# Patient Record
Sex: Female | Born: 1945 | Race: White | Hispanic: No | Marital: Married | Smoking: Never smoker
Health system: Southern US, Community
[De-identification: ages and names within clinical notes are randomized; demographics above are authoritative.]

## PROBLEM LIST (undated history)

## (undated) DIAGNOSIS — G459 Transient cerebral ischemic attack, unspecified: Secondary | ICD-10-CM

## (undated) DIAGNOSIS — M810 Age-related osteoporosis without current pathological fracture: Secondary | ICD-10-CM

## (undated) DIAGNOSIS — I471 Supraventricular tachycardia, unspecified: Secondary | ICD-10-CM

## (undated) DIAGNOSIS — A6920 Lyme disease, unspecified: Secondary | ICD-10-CM

## (undated) DIAGNOSIS — E785 Hyperlipidemia, unspecified: Secondary | ICD-10-CM

## (undated) DIAGNOSIS — E039 Hypothyroidism, unspecified: Secondary | ICD-10-CM

## (undated) DIAGNOSIS — E7800 Pure hypercholesterolemia, unspecified: Secondary | ICD-10-CM

## (undated) DIAGNOSIS — I10 Essential (primary) hypertension: Secondary | ICD-10-CM

## (undated) DIAGNOSIS — K219 Gastro-esophageal reflux disease without esophagitis: Secondary | ICD-10-CM

## (undated) HISTORY — DX: Hyperlipidemia, unspecified: E78.5

## (undated) HISTORY — PX: ABDOMINAL HYSTERECTOMY: SHX81

## (undated) HISTORY — DX: Gastro-esophageal reflux disease without esophagitis: K21.9

## (undated) HISTORY — DX: Age-related osteoporosis without current pathological fracture: M81.0

## (undated) HISTORY — PX: BUNIONECTOMY: SHX129

## (undated) HISTORY — DX: Hypothyroidism, unspecified: E03.9

## (undated) HISTORY — PX: APPENDECTOMY: SHX54

## (undated) HISTORY — DX: Transient cerebral ischemic attack, unspecified: G45.9

---

## 2014-02-10 LAB — HM COLONOSCOPY

## 2016-06-03 DIAGNOSIS — R7301 Impaired fasting glucose: Secondary | ICD-10-CM | POA: Insufficient documentation

## 2016-07-07 DIAGNOSIS — Z8679 Personal history of other diseases of the circulatory system: Secondary | ICD-10-CM | POA: Insufficient documentation

## 2016-07-07 DIAGNOSIS — I471 Supraventricular tachycardia: Secondary | ICD-10-CM | POA: Insufficient documentation

## 2017-10-15 DIAGNOSIS — E785 Hyperlipidemia, unspecified: Secondary | ICD-10-CM

## 2017-11-16 DIAGNOSIS — K589 Irritable bowel syndrome without diarrhea: Secondary | ICD-10-CM | POA: Insufficient documentation

## 2017-11-16 DIAGNOSIS — K219 Gastro-esophageal reflux disease without esophagitis: Secondary | ICD-10-CM | POA: Insufficient documentation

## 2017-12-05 ENCOUNTER — Emergency Department (HOSPITAL_BASED_OUTPATIENT_CLINIC_OR_DEPARTMENT_OTHER): Payer: Medicare Other

## 2017-12-05 ENCOUNTER — Inpatient Hospital Stay (HOSPITAL_BASED_OUTPATIENT_CLINIC_OR_DEPARTMENT_OTHER)
Admission: EM | Admit: 2017-12-05 | Discharge: 2017-12-07 | DRG: 069 | Disposition: A | Discharge status: Home / Self Care | Payer: Medicare Other | Attending: Internal Medicine | Admitting: Internal Medicine

## 2017-12-05 ENCOUNTER — Encounter (HOSPITAL_BASED_OUTPATIENT_CLINIC_OR_DEPARTMENT_OTHER): Payer: Self-pay | Admitting: Emergency Medicine

## 2017-12-05 ENCOUNTER — Other Ambulatory Visit: Payer: Self-pay

## 2017-12-05 DIAGNOSIS — Z888 Allergy status to other drugs, medicaments and biological substances status: Secondary | ICD-10-CM | POA: Diagnosis not present

## 2017-12-05 DIAGNOSIS — K219 Gastro-esophageal reflux disease without esophagitis: Secondary | ICD-10-CM | POA: Diagnosis not present

## 2017-12-05 DIAGNOSIS — E7800 Pure hypercholesterolemia, unspecified: Secondary | ICD-10-CM | POA: Diagnosis not present

## 2017-12-05 DIAGNOSIS — Z79899 Other long term (current) drug therapy: Secondary | ICD-10-CM | POA: Diagnosis not present

## 2017-12-05 DIAGNOSIS — I729 Aneurysm of unspecified site: Secondary | ICD-10-CM

## 2017-12-05 DIAGNOSIS — Z880 Allergy status to penicillin: Secondary | ICD-10-CM | POA: Diagnosis not present

## 2017-12-05 DIAGNOSIS — I671 Cerebral aneurysm, nonruptured: Secondary | ICD-10-CM | POA: Diagnosis present

## 2017-12-05 DIAGNOSIS — Z7982 Long term (current) use of aspirin: Secondary | ICD-10-CM | POA: Diagnosis not present

## 2017-12-05 DIAGNOSIS — I959 Hypotension, unspecified: Secondary | ICD-10-CM | POA: Diagnosis not present

## 2017-12-05 DIAGNOSIS — R29700 NIHSS score 0: Secondary | ICD-10-CM | POA: Diagnosis not present

## 2017-12-05 DIAGNOSIS — E785 Hyperlipidemia, unspecified: Secondary | ICD-10-CM

## 2017-12-05 DIAGNOSIS — Z823 Family history of stroke: Secondary | ICD-10-CM

## 2017-12-05 DIAGNOSIS — E7439 Other disorders of intestinal carbohydrate absorption: Secondary | ICD-10-CM | POA: Diagnosis present

## 2017-12-05 DIAGNOSIS — Z90710 Acquired absence of both cervix and uterus: Secondary | ICD-10-CM | POA: Diagnosis not present

## 2017-12-05 DIAGNOSIS — G459 Transient cerebral ischemic attack, unspecified: Secondary | ICD-10-CM | POA: Diagnosis not present

## 2017-12-05 DIAGNOSIS — I10 Essential (primary) hypertension: Secondary | ICD-10-CM | POA: Diagnosis not present

## 2017-12-05 DIAGNOSIS — Z713 Dietary counseling and surveillance: Secondary | ICD-10-CM

## 2017-12-05 DIAGNOSIS — Z8673 Personal history of transient ischemic attack (TIA), and cerebral infarction without residual deficits: Secondary | ICD-10-CM | POA: Diagnosis present

## 2017-12-05 HISTORY — DX: Transient cerebral ischemic attack, unspecified: G45.9

## 2017-12-05 HISTORY — DX: Essential (primary) hypertension: I10

## 2017-12-05 HISTORY — DX: Hyperlipidemia, unspecified: E78.5

## 2017-12-05 LAB — DIFFERENTIAL
BASOS PCT: 0 %
Basophils Absolute: 0 10*3/uL (ref 0.0–0.1)
Eosinophils Absolute: 0.1 10*3/uL (ref 0.0–0.7)
Eosinophils Relative: 1 %
LYMPHS PCT: 25 %
Lymphs Abs: 2 10*3/uL (ref 0.7–4.0)
MONO ABS: 0.5 10*3/uL (ref 0.1–1.0)
MONOS PCT: 7 %
NEUTROS ABS: 5.4 10*3/uL (ref 1.7–7.7)
Neutrophils Relative %: 67 %

## 2017-12-05 LAB — COMPREHENSIVE METABOLIC PANEL
ALT: 18 U/L (ref 14–54)
AST: 21 U/L (ref 15–41)
Albumin: 4.2 g/dL (ref 3.5–5.0)
Alkaline Phosphatase: 71 U/L (ref 38–126)
Anion gap: 9 (ref 5–15)
BILIRUBIN TOTAL: 0.3 mg/dL (ref 0.3–1.2)
BUN: 21 mg/dL — ABNORMAL HIGH (ref 6–20)
CALCIUM: 9.1 mg/dL (ref 8.9–10.3)
CO2: 26 mmol/L (ref 22–32)
CREATININE: 0.84 mg/dL (ref 0.44–1.00)
Chloride: 103 mmol/L (ref 101–111)
Glucose, Bld: 89 mg/dL (ref 65–99)
Potassium: 4 mmol/L (ref 3.5–5.1)
Sodium: 138 mmol/L (ref 135–145)
TOTAL PROTEIN: 7.1 g/dL (ref 6.5–8.1)

## 2017-12-05 LAB — CBC
HEMATOCRIT: 38.3 % (ref 36.0–46.0)
Hemoglobin: 12.9 g/dL (ref 12.0–15.0)
MCH: 29.2 pg (ref 26.0–34.0)
MCHC: 33.7 g/dL (ref 30.0–36.0)
MCV: 86.7 fL (ref 78.0–100.0)
Platelets: 266 10*3/uL (ref 150–400)
RBC: 4.42 MIL/uL (ref 3.87–5.11)
RDW: 13.3 % (ref 11.5–15.5)
WBC: 8 10*3/uL (ref 4.0–10.5)

## 2017-12-05 LAB — TROPONIN I: Troponin I: <0.03 ng/mL (ref <0.03)

## 2017-12-05 LAB — CBG MONITORING, ED: GLUCOSE-CAPILLARY: 76 mg/dL (ref 65–99)

## 2017-12-05 LAB — APTT: aPTT: 26 seconds (ref 24–36)

## 2017-12-05 LAB — PROTIME-INR
INR: 0.87
Prothrombin Time: 11.7 seconds (ref 11.4–15.2)

## 2017-12-05 MED ORDER — METOPROLOL TARTRATE 25 MG PO TABS
25.0000 mg | ORAL_TABLET | Freq: Two times a day (BID) | ORAL | Status: DC
  Administered 2017-12-06 (×2): 25 mg via ORAL
  Filled 2017-12-05 (×3): qty 1

## 2017-12-05 MED ORDER — PANTOPRAZOLE SODIUM 40 MG PO TBEC
40.0000 mg | DELAYED_RELEASE_TABLET | Freq: Every day | ORAL | Status: DC
  Administered 2017-12-06 – 2017-12-07 (×2): 40 mg via ORAL
  Filled 2017-12-05 (×2): qty 1

## 2017-12-05 MED ORDER — ROSUVASTATIN CALCIUM 20 MG PO TABS
20.0000 mg | ORAL_TABLET | Freq: Every day | ORAL | Status: DC
  Administered 2017-12-06: 20 mg via ORAL
  Filled 2017-12-05: qty 1

## 2017-12-05 MED ORDER — ASPIRIN 325 MG PO TABS
325.0000 mg | ORAL_TABLET | Freq: Every day | ORAL | Status: DC
  Administered 2017-12-06 – 2017-12-07 (×2): 325 mg via ORAL
  Filled 2017-12-05 (×2): qty 1

## 2017-12-05 MED ORDER — ACETAMINOPHEN 325 MG PO TABS: 650.0000 mg | ORAL_TABLET | ORAL | Status: DC | PRN

## 2017-12-05 MED ORDER — ASPIRIN 300 MG RE SUPP: 300.0000 mg | Freq: Every day | RECTAL | Status: DC

## 2017-12-05 MED ORDER — AMLODIPINE BESYLATE 5 MG PO TABS
5.0000 mg | ORAL_TABLET | Freq: Every day | ORAL | Status: DC
  Filled 2017-12-05: qty 1

## 2017-12-05 MED ORDER — ACETAMINOPHEN 650 MG RE SUPP: 650.0000 mg | RECTAL | Status: DC | PRN

## 2017-12-05 MED ORDER — STROKE: EARLY STAGES OF RECOVERY BOOK
Freq: Once | Status: AC
  Administered 2017-12-06: 05:00:00

## 2017-12-05 MED ORDER — ACETAMINOPHEN 160 MG/5ML PO SOLN: 650.0000 mg | ORAL | Status: DC | PRN

## 2017-12-05 NOTE — ED Notes (Signed)
Patient transported to CT 

## 2017-12-05 NOTE — Consult Note (Signed)
TeleNeurology Consult Note  Allison Richardson     Consulting Physician: Stanley Garken Lue Code Status: No Order Primary Care Physician:  No primary care provider on file.  DOB:  07/05/1946   Age: 72 y.o.  Date of Service: December 05, 2017 Admit Date:  12/05/2017   Admitting Diagnosis: <principal problem not specified>  Subjective:  Reason for Consultation:stroke  Chief Complaint: right leg weakness/numbness  History of present illness: 72 y/o woman with h/o HTN and hyperlipidemia who presents with right leg weakness/nunbness starting yesterday at 1530. Emergent telestroke consult requested. CT head reviewed and case discussed with ED staff. NIHSS 0.  Review of Systems  There are no active problems to display for this patient.  Past Medical History:  Diagnosis Date  . High cholesterol   . Hypertension     Allergies  Allergen Reactions  . Penicillins     Social History   Socioeconomic History  . Marital status: Married    Spouse name: Not on file  . Number of children: Not on file  . Years of education: Not on file  . Highest education level: Not on file  Social Needs  . Financial resource strain: Not on file  . Food insecurity - worry: Not on file  . Food insecurity - inability: Not on file  . Transportation needs - medical: Not on file  . Transportation needs - non-medical: Not on file  Occupational History  . Not on file  Tobacco Use  . Smoking status: Not on file  Substance and Sexual Activity  . Alcohol use: Not on file  . Drug use: Not on file  . Sexual activity: Not on file  Other Topics Concern  . Not on file  Social History Narrative  . Not on file   Family History No family history on file.    Objective:  Vital signs in last 24 hours:   No data recorded.    Intake/Output last 3 shifts: No intake/output data recorded.  Intake/Output this shift: No intake/output data recorded.  Physical Exam 1a- LOC: Keenly responsive - 0      1b- LOC  questions: Answers both questions correctly - 0     1c- LOC commands- Performs both tasks correctly- 0     2- Gaze: Normal; no gaze paresis or gaze deviation - 0     3- Visual Fields: normal, no Visual field deficit - 0     4- Facial movements: no facial palsy - 0     5a- Right Upper limb motor - no drift -0     5b -Left Upper limb motor - no drift -0     6a- Right Lower limb motor - no drift - 0      6b- Left Lower limb motor - no drift - 0 7- Limb Coordination: absent ataxia - 0      8- Sensory : no sensory loss - 0      9- Language - No aphasia - 0      10- Speech - No dysarthria -0     11- Neglect / Extinction - none found -0     NIHSS score   0   Diagnostic Findings:  Pertinent Labs: No results for input(s): WBC, MCV in the last 168 hours.  Invalid input(s): HEMATOCRIT, HEMOGLOBIN, PLATELETS No results for input(s): POTASSIUM, CHLORIDE, CALCIUM, BUN, GLUCOSE, CREATININE, CO2 in the last 168 hours.  Invalid input(s): SODIUM, ANION GAP2, GFR MDRD AF AMER No results for input(s): AST, ALT,   ALBUMIN in the last 168 hours.  Invalid input(s): ALK PHOS, BILIRUBIN TOTAL, BILIRUBIN DIRECT, PROTEIN TOTAL, GLOBULIN No results for input(s): TRIG in the last 168 hours.  Invalid input(s): CHLPL, HDL PML, VLDL CALC, LDL CALC, CHOL/HDL RATIO, LDL/HDL RATIO, NON-HDL CHOLESTEROL Invalid input(s): CK TOTAL, TROPONIN I, CK MB No results for input(s): APTT in the last 168 hours. No results for input(s): INR in the last 168 hours.  Invalid input(s): PROTHROMBIN TIME  Extensive review of previous medical records completed.    Assessment: There are no active problems to display for this patient.    Plan: [facility] TeleNeurology Consult Services  Impression: TIA     Differential Diagnosis:  1. Cardioembolic stroke  2. Small vessel disease/lacune    3. Thromboembolic, artery-to-artery mechanism 4. Hypercoagulable state-related infarct  5. Thrombotic mechanism, large artery  disease 6. Transient ischemic attack  Comments: Last known well:   1530 yesterday Door [id number] [facility] contacted:   [id number] [facility] at bedside:   1712 NIHSS assessment time:1712 CT reviewed at 1715 Needle time:TPA not considered since she is out of the TPA window Thrombectomy not considered since large vessel occlusion is not suspected, NIHSS 0 and symptoms > 24 hours     Discussion:     Our recommendations are outlined below.  Recommendations: ASA, IV fluids and permissive hypertension (Keep SBP < 220/110) Neurochecks DVT prophylaxis    Follow up with Neurology for further testing and evaluation  Medical Decision Making:  - Extensive number of diagnosis or management options are considered above. - Extensive amount of complex data reviewed.  - High risk of complication and/or morbidity or mortality are associated with differential diagnostic considerations above. - There may be uncertain outcome and increased probability of prolonged functional impairment or high probability of severe prolonged functional impairment associated with some of these differential diagnosis.  Medical Data Reviewed: 1.Data reviewed include clinical labs, radiology, Medical Tests; 2.Tests results discussed w/performing or interpreting physician;    3.Obtaining/reviewing old medical records; 4.Obtaining case history from another source;    5.Independent review of image, tracing or specimen.  Signed: 12/05/2017  5:16 PM

## 2017-12-05 NOTE — H&P (Addendum)
TRH H&P   Patient Demographics:    Allison Richardson, is a 72 y.o. female  MRN: [id number]   DOB - 06/24/1946  Admit Date - 12/05/2017  Outpatient Primary MD for the patient is No primary care provider on file.  Referring MD/NP/PA:   Demetrios Loll  Outpatient Specialists:     Patient coming from:  [place]   Chief Complaint  Patient presents with  . Numbness      HPI:    Allison Richardson  is a 72 y.o. female, w hypertension, hyperlipidemia, gerd, apparently presents with right leg numbness, and heaviness this AM ,lasting for about 1 hour, she apparently lost control of her legs several times since yesterday at 3pm.  Some right sided headache today 30 minutes prior to arrival in ED.  Tele neurology consulted and Dr. Jacqlyn Krauss recommended TIA w/up and permissive hypertension.  Pt took 324mg  of aspirin today.   In ED,  CT brain negative  INR 0.87 PTT 26 Wbc 8.0, Hgb 12.9, Plt 266 Na 138, K 4.0, Bun 21, Creatinine 0.84 Ast 21, Alt 18  Pt will be admitted for w/up of TIA r/o CVA     Review of systems:    In addition to the HPI above,  No Fever-chills, No changes with Vision or hearing, No problems swallowing food or Liquids, No Chest pain, Cough or Shortness of Breath, No Abdominal pain, No Nausea or Vommitting, Bowel movements are regular, No Blood in stool or Urine, No dysuria, No new skin rashes or bruises, No new joints pains-aches,  No new weakness, tingling, numbness in any extremity, No recent weight gain or loss, No polyuria, polydypsia or polyphagia, No significant Mental Stressors.  A full 10 point Review of Systems was done, except as stated above, all other Review of Systems were negative.   With Past History of the following :    Past Medical History:  Diagnosis Date  . High cholesterol   . Hypertension       Past Surgical History:  Procedure  Laterality Date  . ABDOMINAL HYSTERECTOMY    . BUNIONECTOMY Left       Social History:     Social History   Tobacco Use  . Smoking status: Never Smoker  . Smokeless tobacco: Never Used  Substance Use Topics  . Alcohol use: No    Frequency: Never     Lives - at [employer] - walks by self   Family History :     Family History  Problem Relation Age of Onset  . Stroke Mother   . Stroke Father       Home Medications:   Prior to Admission medications   Medication Sig Start Date End Date Taking? Authorizing Provider  amLODipine (NORVASC) 5 MG tablet Take 5 mg by mouth daily.   Yes [provider]  aspirin 81 MG chewable tablet Chew  81 mg by mouth daily.   Yes [provider]  Atorvastatin Calcium (LIPITOR PO) Take by mouth daily.   Yes [provider]  losartan-hydrochlorothiazide (HYZAAR) 100-12.5 MG tablet Take 1 tablet by mouth daily.   Yes [provider]  metoprolol tartrate (LOPRESSOR) 25 MG tablet Take 25 mg by mouth 2 (two) times daily.   Yes [provider]  omeprazole (PRILOSEC) 20 MG capsule Take 20 mg by mouth daily.   Yes [provider]     Allergies:     Allergies  Allergen Reactions  . Other     Has had adverse reaction to a migraine medication  . Penicillins Itching     Physical Exam:   Vitals  Blood pressure 127/60, pulse (!) 57, temperature 97.6 F (36.4 C), temperature source Oral, resp. rate 17, height 5\' 7"  (1.702 m), weight 81.9 kg (180 lb 8.9 oz), SpO2 99 %.   1. General  lying in bed in NAD,   2. Normal affect and insight, Not Suicidal or Homicidal, Awake Alert, Oriented X 3.  3. No F.N deficits, ALL C.Nerves Intact, Strength 5/5 all 4 extremities, Sensation intact all 4 extremities, Plantars down going.  4. Ears and Eyes appear Normal, Conjunctivae clear, PERRLA. Moist Oral Mucosa.  5. Supple Neck, No JVD, No cervical lymphadenopathy appriciated, No Carotid  Bruits.  6. Symmetrical Chest wall movement, Good air movement bilaterally, CTAB.  7. RRR, No Gallops, Rubs or Murmurs, No Parasternal Heave.  8. Positive Bowel Sounds, Abdomen Soft, No tenderness, No organomegaly appriciated,No rebound -guarding or rigidity.  9.  No Cyanosis, Normal Skin Turgor, No Skin Rash or Bruise.  10. Good muscle tone,  joints appear normal , no effusions, Normal ROM.  11. No Palpable Lymph Nodes in Neck or Axillae     Data Review:    CBC Recent Labs  Lab 12/05/17 1703  WBC 8.0  HGB 12.9  HCT 38.3  PLT 266  MCV 86.7  MCH 29.2  MCHC 33.7  RDW 13.3  LYMPHSABS 2.0  MONOABS 0.5  EOSABS 0.1  BASOSABS 0.0   ------------------------------------------------------------------------------------------------------------------  Chemistries  Recent Labs  Lab 12/05/17 1703  NA 138  K 4.0  CL 103  CO2 26  GLUCOSE 89  BUN 21*  CREATININE 0.84  CALCIUM 9.1  AST 21  ALT 18  ALKPHOS 71  BILITOT 0.3   ------------------------------------------------------------------------------------------------------------------ estimated creatinine clearance is 67.6 mL/min (by C-G formula based on SCr of 0.84 mg/dL). ------------------------------------------------------------------------------------------------------------------ No results for input(s): TSH, T4TOTAL, T3FREE, THYROIDAB in the last 72 hours.  Invalid input(s): FREET3  Coagulation profile Recent Labs  Lab 12/05/17 1703  INR 0.87   ------------------------------------------------------------------------------------------------------------------- No results for input(s): DDIMER in the last 72 hours. -------------------------------------------------------------------------------------------------------------------  Cardiac Enzymes Recent Labs  Lab 12/05/17 1703  TROPONINI <0.03    ------------------------------------------------------------------------------------------------------------------ No results found for: BNP   ---------------------------------------------------------------------------------------------------------------  Urinalysis No results found for: COLORURINE, APPEARANCEUR, LABSPEC, PHURINE, GLUCOSEU, HGBUR, BILIRUBINUR, KETONESUR, PROTEINUR, UROBILINOGEN, NITRITE, LEUKOCYTESUR  ----------------------------------------------------------------------------------------------------------------   Imaging Results:    Ct Head Wo Contrast  Result Date: 12/05/2017 CLINICAL DATA:  Pt states she has numbness in her right leg, legs gave out yesterday, headache, pt states she just felt "weird", no blurred vision, no hx stroke, HTN on many meds, mild headache right frontal area EXAM: CT HEAD WITHOUT CONTRAST TECHNIQUE: Contiguous axial images were obtained from the base of the skull through the vertex without intravenous contrast. COMPARISON:  None. FINDINGS: Brain: No evidence of acute infarction, hemorrhage,  hydrocephalus, extra-axial collection or mass lesion/mass effect. Vascular: No hyperdense vessel or unexpected calcification. Skull: Normal. Negative for fracture or focal lesion. Sinuses/Orbits: Globes and orbits are unremarkable. Visualized sinuses and mastoid air cells are clear. Other: None. IMPRESSION: Normal unenhanced CT scan of the brain. Electronically Signed   By: Amie Portland M.D.   On: 12/05/2017 17:16   nsr at 60 nl axis, early R no st-t changes c/w ischemia     Assessment & Plan:    Principal Problem:   TIA (transient ischemic attack) Active Problems:   Essential hypertension   Hyperlipidemia    TIA Tele MRI/ MRA brain Carotid ultrasound Cardiac echo Check hga1c, lipid,  PT/OT, speech consult Aspirin 325mg  po qday Lipitor=> Crestor Please contact neurology for consult in am.   Hypertension  Cont metoprolol Cont  amlodipine Hold Hyzaar for permissive hypertension  Hyperlipidemia Cont Lipitor  Gerd Cont PPI  DVT Prophylaxis Lovenox - SCDs  AM Labs Ordered, also please review Full Orders  Family Communication: Admission, patients condition and plan of care including tests being ordered have been discussed with the patient who indicate understanding and agree with the plan and Code Status.  Code Status FULL CODE  Likely DC to  Home to [employer] (pt would like to be discharged tomorrow if w/up negative)  Condition GUARDED   Consults called: none, please call neurology in AM  Admission status: observation  Time spent in minutes : 45   Pearson Grippe M.D on 12/05/2017 at 9:40 PM  Between 7am to 7pm - Pager - [phone]  . After 7pm go to [email or web address] - password [facility]  [facility] - Office  [phone]

## 2017-12-05 NOTE — ED Notes (Addendum)
Pt returned from CT. Tele-neuro in progress. Pt reports right side headache and right leg numbness (knee down) since 1630. Sx started while sitting [place] in rec [place] [facility] [employer] doing a puzzle. States she fell yesterday in her closet around 3pm "both legs weak" and "lost all control of my legs". States she felt "slightly disoriented" at that time. States her symptoms improved although she still felt "a little weak" while walking for the rest of day. States this am she was mostly back to normal. Reports her right foot "was a little numb" this afternoon prior to leaving to go do a puzzle in the rec room. Sx are resolving at this time

## 2017-12-05 NOTE — ED Provider Notes (Signed)
[facility] EMERGENCY DEPARTMENT Provider Note   CSN: [id number] Arrival date & time: 12/05/17  1652     History   Chief Complaint Chief Complaint  Patient presents with  . Numbness    HPI Allison Richardson is a 72 y.o. female.  HPI 72 year old Caucasian female past medical history significant for high cholesterol, hypertension presents to the emergency department today with complaint of headache and right leg numbness.  Upon further questioning patient states that since yesterday approximately 3 PM she has had "weakness in both legs".  She states that she has almost lost control of her legs several times since yesterday.  She also reports being slightly "disoriented".  Patient states approximate 30 minutes prior to arrival today she got right-sided headache and complained of right leg numbness from the knee down.  She states that it has improved since that time.  Patient denies any other associated symptoms including aphasia, ataxia, facial droop, weakness.  Again patient states that her symptoms are resolving.  She denies any history of CVA that does report family history of CVA.    Pt denies any fever, chill, vision changes, lightheadedness, dizziness, congestion, neck pain, cp, sob, cough, abd pain, n/v/d, urinary symptoms, change in bowel habits, melena, hematochezia.  Past Medical History:  Diagnosis Date  . High cholesterol   . Hypertension     There are no active problems to display for this patient.   Past Surgical History:  Procedure Laterality Date  . ABDOMINAL HYSTERECTOMY    . BUNIONECTOMY Left     OB History    No data available       Home Medications    Prior to Admission medications   Medication Sig Start Date End Date Taking? Authorizing Provider  amLODipine (NORVASC) 5 MG tablet Take 5 mg by mouth daily.   Yes [provider]  aspirin 81 MG chewable tablet Chew 81 mg by mouth daily.   Yes [provider]  Atorvastatin Calcium  (LIPITOR PO) Take by mouth daily.   Yes [provider]  losartan-hydrochlorothiazide (HYZAAR) 100-12.5 MG tablet Take 1 tablet by mouth daily.   Yes [provider]  metoprolol tartrate (LOPRESSOR) 25 MG tablet Take 25 mg by mouth 2 (two) times daily.   Yes [provider]  omeprazole (PRILOSEC) 20 MG capsule Take 20 mg by mouth daily.   Yes [provider]    Family History No family history on file.  Social History Social History   Tobacco Use  . Smoking status: Never Smoker  . Smokeless tobacco: Never Used  Substance Use Topics  . Alcohol use: No    Frequency: Never  . Drug use: No     Allergies   Other and Penicillins   Review of Systems Review of Systems  Constitutional: Negative for chills and fever.  HENT: Negative for congestion.   Eyes: Negative for visual disturbance.  Respiratory: Negative for cough and shortness of breath.   Cardiovascular: Negative for chest pain.  Gastrointestinal: Negative for abdominal pain, diarrhea, nausea and vomiting.  Genitourinary: Negative for dysuria, flank pain, frequency, hematuria, urgency, vaginal bleeding and vaginal discharge.  Musculoskeletal: Negative for arthralgias and myalgias.  Skin: Negative for rash.  Neurological: Positive for weakness, numbness and headaches. Negative for dizziness, syncope and light-headedness.  Psychiatric/Behavioral: Negative for sleep disturbance. The patient is not nervous/anxious.      Physical Exam Updated Vital Signs Temp 98.1 F (36.7 C)   Physical Exam  Constitutional: She is oriented  to person, place, and time. She appears well-developed and well-nourished.  Non-toxic appearance. No distress.  HENT:  Head: Normocephalic and atraumatic.  Nose: Nose normal.  Mouth/Throat: Oropharynx is clear and moist.  Eyes: Conjunctivae are normal. Pupils are equal, round, and reactive to light. Right eye exhibits no discharge. Left eye exhibits no discharge.    Neck: Normal range of motion. Neck supple.  Cardiovascular: Normal rate, regular rhythm, normal heart sounds and intact distal pulses. Exam reveals no gallop and no friction rub.  No murmur heard. Pulmonary/Chest: Effort normal and breath sounds normal. No stridor. No respiratory distress. She has no wheezes. She has no rales. She exhibits no tenderness.  Abdominal: Soft. Bowel sounds are normal. There is no tenderness. There is no rebound and no guarding.  Musculoskeletal: Normal range of motion. She exhibits no tenderness.  Lymphadenopathy:    She has no cervical adenopathy.  Neurological: She is alert and oriented to person, place, and time.  The patient is alert, attentive, and oriented x 3. Speech is clear. Cranial nerve II-VII grossly intact. Negative pronator drift. Sensation intact. Strength 5/5 in all extremities. Reflexes 2+ and symmetric at biceps, triceps, knees, and ankles. Rapid alternating movement and fine finger movements intact.  Romberg and gait deferred   Skin: Skin is warm and dry. Capillary refill takes less than 2 seconds.  Psychiatric: Her behavior is normal. Judgment and thought content normal.  Nursing note and vitals reviewed.    ED Treatments / Results  Labs (all labs ordered are listed, but only abnormal results are displayed) Labs Reviewed  COMPREHENSIVE METABOLIC PANEL - Abnormal; Notable for the following components:      Result Value   BUN 21 (*)    All other components within normal limits  PROTIME-INR  APTT  CBC  DIFFERENTIAL  TROPONIN I  CBG MONITORING, ED    EKG  EKG Interpretation  Date/Time:  Saturday December 05 2017 17:12:07 EST Ventricular Rate:  59 PR Interval:    QRS Duration: 89 QT Interval:  407 QTC Calculation: 404 R Axis:   -21 Text Interpretation:  Sinus arrhythmia Borderline left axis deviation Baseline wander in lead(s) V2 No prior ECg for comparison.  No STEMI Confirmed by Theda Belfast ([id number]) on 12/05/2017 5:14:27  PM       Radiology Ct Head Wo Contrast  Result Date: 12/05/2017 CLINICAL DATA:  Pt states she has numbness in her right leg, legs gave out yesterday, headache, pt states she just felt "weird", no blurred vision, no hx stroke, HTN on many meds, mild headache right frontal area EXAM: CT HEAD WITHOUT CONTRAST TECHNIQUE: Contiguous axial images were obtained from the base of the skull through the vertex without intravenous contrast. COMPARISON:  None. FINDINGS: Brain: No evidence of acute infarction, hemorrhage, hydrocephalus, extra-axial collection or mass lesion/mass effect. Vascular: No hyperdense vessel or unexpected calcification. Skull: Normal. Negative for fracture or focal lesion. Sinuses/Orbits: Globes and orbits are unremarkable. Visualized sinuses and mastoid air cells are clear. Other: None. IMPRESSION: Normal unenhanced CT scan of the brain. Electronically Signed   By: Amie Portland M.D.   On: 12/05/2017 17:16    Procedures Procedures (including critical care time)  Medications Ordered in ED Medications - No data to display   Initial Impression / Assessment and Plan / ED Course  I have reviewed the triage vital signs and the nursing notes.  Pertinent labs & imaging results that were available during my care of the patient were reviewed by me and considered  in my medical decision making (see chart for details).     Patient presents to the ED with complaints of headache and right leg numbness.  Patient although states that for the past day she has been having bilateral leg weakness, disorientation, numbness.  States that 30 minutes prior to arrival today she had a right-sided headache with numbness to her right leg below the right knee that has progressively improved since coming to the ED.  On initial examination patient has no focal neuro deficit.  Vital signs are reassuring.  Stroke was not initiated.  Patient was sent to CT.  Patient evaluated by Dr. Demetrio LappingLue with teleneurology.   Feel that patient symptoms likely TIA will need admission for further workup.  Direct recommends aspirin, IV fluids.   EKG shows no signs of acute ischemia.  CT of head was unremarkable.  CBG was normal.  Lab work pending at this time.  Lab work reassuring.  Leukocytosis.  Normal kidney function.  Troponin negative.  Patient has already received 324 mg ASA prior to arrival.  Spoke with Dr. Clyde LundborgNiu with hospital medicine who agrees to admission.  Patient updated on plan of care.  [facility].  She remains hemodynamically stable this time. Final Clinical Impressions(s) / ED Diagnoses   Final diagnoses:  TIA (transient ischemic attack)    ED Discharge Orders    None       Wallace KellerLeaphart, Yaneisy Wenz T, PA-C 12/05/17 1916    Tegeler, Canary Brimhristopher J, MD 12/07/17 [phone]

## 2017-12-05 NOTE — ED Triage Notes (Signed)
Numbness to R leg x 30 min with R side headache x 30 min.

## 2017-12-05 NOTE — ED Notes (Signed)
ED Provider at bedside at time pt brought from triage to evaluate

## 2017-12-05 NOTE — Care Management (Signed)
  This is a no charge note  [facility], Joselyn Glassmanyler  72 year old lady with past medical history of hypertension, hyperlipidemia, GERD, who presents with right leg weakness, numbness and right-sided headache.  Pt states that she lost control of her legs several times since yesterday at about 3:00 PM. Patient states approximate 30 minutes prior to arrival today she got right-sided headache and complained of right leg numbness from the knee down. She states that it has improved since that time. CT head is negative. Tele neurology was consulted. Dr. Jacqlyn KraussGarken recommended TIA workup and permissive hypertension (<220/110). Patient already took 324 mg of aspirin.   Patient was found to have WBC 8.0, INR 0.87, PVD 26, negative troponin, electrolytes renal function okay, temperature normal, bradycardia, oxygen saturation 96% on room air. Patient is placed on telemetry bed for observation. May need to consult our neurologist at the patient arrival.    Please call manager of [facility] at [phone] when pt arrives to floor   Lorretta HarpXilin Damarian Priola, MD  [facility] Pager [phone]  If 7PM-7AM, please contact night-coverage [email or web address] Password [email or web address] 12/05/2017, 7:15 PM

## 2017-12-05 NOTE — ED Notes (Signed)
ED Provider at bedside Cruzita Lederer([place], PA). Pt is taking her home meds with verbal approval from provider. Pt reports she took her 81 mg ASA today and then a double strength ASA this afternoon

## 2017-12-05 NOTE — ED Notes (Signed)
Pt [facility] Bary CastillaKalil [facility]

## 2017-12-05 NOTE — ED Notes (Signed)
Pt on cardiac monitor and auto VS 

## 2017-12-06 ENCOUNTER — Observation Stay (HOSPITAL_COMMUNITY): Payer: Medicare Other

## 2017-12-06 DIAGNOSIS — Z7982 Long term (current) use of aspirin: Secondary | ICD-10-CM | POA: Diagnosis not present

## 2017-12-06 DIAGNOSIS — Z79899 Other long term (current) drug therapy: Secondary | ICD-10-CM | POA: Diagnosis not present

## 2017-12-06 DIAGNOSIS — Z90710 Acquired absence of both cervix and uterus: Secondary | ICD-10-CM | POA: Diagnosis not present

## 2017-12-06 DIAGNOSIS — Z823 Family history of stroke: Secondary | ICD-10-CM | POA: Diagnosis not present

## 2017-12-06 DIAGNOSIS — E7800 Pure hypercholesterolemia, unspecified: Secondary | ICD-10-CM | POA: Diagnosis present

## 2017-12-06 DIAGNOSIS — I959 Hypotension, unspecified: Secondary | ICD-10-CM | POA: Diagnosis not present

## 2017-12-06 DIAGNOSIS — E7439 Other disorders of intestinal carbohydrate absorption: Secondary | ICD-10-CM | POA: Diagnosis present

## 2017-12-06 DIAGNOSIS — R29700 NIHSS score 0: Secondary | ICD-10-CM | POA: Diagnosis present

## 2017-12-06 DIAGNOSIS — I361 Nonrheumatic tricuspid (valve) insufficiency: Secondary | ICD-10-CM | POA: Diagnosis not present

## 2017-12-06 DIAGNOSIS — I671 Cerebral aneurysm, nonruptured: Secondary | ICD-10-CM | POA: Diagnosis present

## 2017-12-06 DIAGNOSIS — Z888 Allergy status to other drugs, medicaments and biological substances status: Secondary | ICD-10-CM | POA: Diagnosis not present

## 2017-12-06 DIAGNOSIS — G459 Transient cerebral ischemic attack, unspecified: Secondary | ICD-10-CM | POA: Diagnosis present

## 2017-12-06 DIAGNOSIS — I10 Essential (primary) hypertension: Secondary | ICD-10-CM | POA: Diagnosis present

## 2017-12-06 DIAGNOSIS — Z713 Dietary counseling and surveillance: Secondary | ICD-10-CM | POA: Diagnosis not present

## 2017-12-06 DIAGNOSIS — E785 Hyperlipidemia, unspecified: Secondary | ICD-10-CM | POA: Diagnosis present

## 2017-12-06 DIAGNOSIS — Z880 Allergy status to penicillin: Secondary | ICD-10-CM | POA: Diagnosis not present

## 2017-12-06 DIAGNOSIS — K219 Gastro-esophageal reflux disease without esophagitis: Secondary | ICD-10-CM | POA: Diagnosis present

## 2017-12-06 LAB — LIPID PANEL
CHOL/HDL RATIO: 3.1 ratio
Cholesterol: 154 mg/dL (ref 0–200)
HDL: 49 mg/dL (ref >40)
LDL Cholesterol: 86 mg/dL (ref 0–99)
Triglycerides: 93 mg/dL (ref <150)
VLDL: 19 mg/dL (ref 0–40)

## 2017-12-06 LAB — COMPREHENSIVE METABOLIC PANEL
ALT: 17 U/L (ref 14–54)
AST: 28 U/L (ref 15–41)
Albumin: 3.9 g/dL (ref 3.5–5.0)
Alkaline Phosphatase: 67 U/L (ref 38–126)
Anion gap: 11 (ref 5–15)
BUN: 13 mg/dL (ref 6–20)
CHLORIDE: 103 mmol/L (ref 101–111)
CO2: 25 mmol/L (ref 22–32)
CREATININE: 0.8 mg/dL (ref 0.44–1.00)
Calcium: 9 mg/dL (ref 8.9–10.3)
GFR calc Af Amer: >60 mL/min (ref >60)
GFR calc non Af Amer: >60 mL/min (ref >60)
Glucose, Bld: 95 mg/dL (ref 65–99)
POTASSIUM: 3.6 mmol/L (ref 3.5–5.1)
SODIUM: 139 mmol/L (ref 135–145)
Total Bilirubin: 0.8 mg/dL (ref 0.3–1.2)
Total Protein: 6.8 g/dL (ref 6.5–8.1)

## 2017-12-06 LAB — CBC
HEMATOCRIT: 40.2 % (ref 36.0–46.0)
Hemoglobin: 13.2 g/dL (ref 12.0–15.0)
MCH: 28.7 pg (ref 26.0–34.0)
MCHC: 32.8 g/dL (ref 30.0–36.0)
MCV: 87.4 fL (ref 78.0–100.0)
PLATELETS: 254 10*3/uL (ref 150–400)
RBC: 4.6 MIL/uL (ref 3.87–5.11)
RDW: 13.8 % (ref 11.5–15.5)
WBC: 6.2 10*3/uL (ref 4.0–10.5)

## 2017-12-06 MED ORDER — ASPIRIN 325 MG PO TABS: 325.0000 mg | ORAL_TABLET | Freq: Every day | ORAL | 0 refills | Status: DC

## 2017-12-06 NOTE — Progress Notes (Signed)
  Echocardiogram 2D Echocardiogram has been performed.  Eugenia Eldredge T Duey Liller 12/06/2017, 3:33 PM

## 2017-12-06 NOTE — Progress Notes (Signed)
Patient ID: Allison HongBillie Mundo, female   DOB: 02/13/1946, 72 y.o.   MRN: [id number]                                                                PROGRESS NOTE                                                                                                                                                                                                             Patient Demographics:    Allison Richardson, is a 72 y.o. female, DOB - 03/02/1946, WUJ:[id number]  Admit date - 12/05/2017   Admitting Physician Lorretta HarpXilin Niu, MD  Outpatient Primary MD for the patient is No primary care provider on file.  LOS - 0  Outpatient Specialists:  Chief Complaint  Patient presents with  . Numbness       Brief Narrative  Allison HongBillie Bleier  is a 72 y.o. female, w hypertension, hyperlipidemia, gerd, apparently presents with right leg numbness, and heaviness this AM ,lasting for about 1 hour, she apparently lost control of her legs several times since yesterday at 3pm.  Some right sided headache today 30 minutes prior to arrival in ED.  Tele neurology consulted and Dr. Jacqlyn KraussGarken recommended TIA w/up and permissive hypertension.  Pt took 324mg  of aspirin today.   In ED,  CT brain negative  INR 0.87 PTT 26 Wbc 8.0, Hgb 12.9, Plt 266 Na 138, K 4.0, Bun 21, Creatinine 0.84 Ast 21, Alt 18  Pt will be admitted for w/up of TIA r/o CVA      Subjective:    Allison HongBillie Mounce today has no numbness, weakness of her legs.   No headache, No chest pain, No abdominal pain - No Nausea, No new weakness tingling or numbness, No Cough - SOB.    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 Recommended outpatient f/u w  neurology  Hypertension  Cont metoprolol Cont amlodipine  Hyperlipidemia Cont Crestor  Gerd Cont PPI    DVT Prophylaxis Lovenox -  SCDs  AM Labs Ordered, also please review Full Orders  Family Communication: Admission, patients condition and plan of care including tests being ordered have been discussed with the patient who indicate understanding and agree with the plan and Code Status.  Code Status FULL CODE  Likely DC to  Home to [employer] (pt would like to be discharged tomorrow if w/up negative)  Condition GUARDED   Consults called: none, please call neurology in AM  Admission status: observation  Time spent in minutes : 45   .  Anti-infectives (From admission, onward)   None        Objective:   Vitals:   12/05/17 2000 12/05/17 2100 12/06/17 0805 12/06/17 1215  BP: 102/67 127/60 (!) 107/55 (!) 109/53  Pulse: (!) 53 (!) 57 64 (!) 58  Resp: 17 17 18 17   Temp:  97.6 F (36.4 C) 97.8 F (36.6 C) 97.6 F (36.4 C)  TempSrc:  Oral Oral Oral  SpO2: 98% 99% 99% 98%  Weight:  81.9 kg (180 lb 8.9 oz)    Height:  5\' 7"  (1.702 m)      Wt Readings from Last 3 Encounters:  12/05/17 81.9 kg (180 lb 8.9 oz)    No intake or output data in the 24 hours ending 12/06/17 1648   Physical Exam  Awake Alert, Oriented X 3, No new F.N deficits, Normal affect [place].AT,PERRAL Supple Neck,No JVD, No cervical lymphadenopathy appriciated.  Symmetrical Chest wall movement, Good air movement bilaterally, CTAB RRR,No Gallops,Rubs or new Murmurs, No Parasternal Heave +ve B.Sounds, Abd Soft, No tenderness, No organomegaly appriciated, No rebound - guarding or rigidity. No Cyanosis, Clubbing or edema, No new Rash or bruise      Data Review:    CBC Recent Labs  Lab 12/05/17 1703 12/06/17 0617  WBC 8.0 6.2  HGB 12.9 13.2  HCT 38.3 40.2  PLT 266 254  MCV 86.7 87.4  MCH 29.2 28.7  MCHC 33.7 32.8  RDW 13.3 13.8  LYMPHSABS 2.0  --   MONOABS 0.5  --    EOSABS 0.1  --   BASOSABS 0.0  --     Chemistries  Recent Labs  Lab 12/05/17 1703 12/06/17 0617  NA 138 139  K 4.0 3.6  CL 103 103  CO2 26 25  GLUCOSE 89 95  BUN 21* 13  CREATININE 0.84 0.80  CALCIUM 9.1 9.0  AST 21 28  ALT 18 17  ALKPHOS 71 67  BILITOT 0.3 0.8   ------------------------------------------------------------------------------------------------------------------ Recent Labs    12/06/17 0617  CHOL 154  HDL 49  LDLCALC 86  TRIG 93  CHOLHDL 3.1    No results found for: HGBA1C ------------------------------------------------------------------------------------------------------------------ No results for input(s): TSH, T4TOTAL, T3FREE, THYROIDAB in the last 72 hours.  Invalid input(s): FREET3 ------------------------------------------------------------------------------------------------------------------ No results for input(s): VITAMINB12, FOLATE, FERRITIN, TIBC, IRON, RETICCTPCT in the last 72 hours.  Coagulation profile Recent Labs  Lab 12/05/17 1703  INR 0.87    No results for input(s): DDIMER in the last 72 hours.  Cardiac Enzymes Recent Labs  Lab 12/05/17 1703  TROPONINI <0.03   ------------------------------------------------------------------------------------------------------------------ No results found for: BNP  Inpatient Medications  Scheduled Meds: . amLODipine  5 mg Oral Daily  . aspirin  300 mg Rectal Daily   Or  . aspirin  325 mg Oral Daily  . metoprolol tartrate  25 mg Oral BID  . pantoprazole  40 mg Oral Daily  . rosuvastatin  20 mg Oral q1800   Continuous Infusions:  PRN Meds:.acetaminophen **OR** acetaminophen (TYLENOL) oral liquid 160 mg/5 mL **OR** acetaminophen  Micro Results No results found for this or any previous visit (from the past 240 hour(s)).  Radiology Reports Ct Head Wo Contrast  Result Date: 12/05/2017 CLINICAL DATA:  Pt states she has numbness in her right leg, legs gave out yesterday,  headache, pt states she just felt "weird", no blurred vision, no hx stroke, HTN on many meds, mild headache right frontal area EXAM: CT HEAD WITHOUT CONTRAST TECHNIQUE: Contiguous axial images were obtained from the base of the skull through the vertex without intravenous contrast. COMPARISON:  None. FINDINGS: Brain: No evidence of acute infarction, hemorrhage, hydrocephalus, extra-axial collection or mass lesion/mass effect. Vascular: No hyperdense vessel or unexpected calcification. Skull: Normal. Negative for fracture or focal lesion. Sinuses/Orbits: Globes and orbits are unremarkable. Visualized sinuses and mastoid air cells are clear. Other: None. IMPRESSION: Normal unenhanced CT scan of the brain. Electronically Signed   By: Amie Portland M.D.   On: 12/05/2017 17:16   Mr Brain Wo Contrast  Result Date: 12/06/2017 CLINICAL DATA:  Disoriented. Lower extremity weakness and RIGHT headache. EXAM: MRI HEAD WITHOUT CONTRAST MRA HEAD WITHOUT CONTRAST TECHNIQUE: Multiplanar, multiecho pulse sequences of the brain and surrounding structures were obtained without intravenous contrast. Angiographic images of the head were obtained using MRA technique without contrast. COMPARISON:  CT HEAD December 05, 2017 FINDINGS: MRI HEAD FINDINGS INTRACRANIAL CONTENTS: No reduced diffusion to suggest acute ischemia. No susceptibility artifact to suggest hemorrhage. The ventricles and sulci are normal for patient's age. No suspicious parenchymal signal, masses, mass effect. No abnormal extra-axial fluid collections. No extra-axial masses. VASCULAR: Normal major intracranial vascular flow voids present at skull base. SKULL AND UPPER CERVICAL SPINE: No abnormal sellar expansion. No suspicious calvarial bone marrow signal. Craniocervical junction maintained. SINUSES/ORBITS: The mastoid air-cells and included paranasal sinuses are well-aerated.The included ocular globes and orbital contents are non-suspicious. OTHER: None. MRA HEAD  FINDINGS ANTERIOR CIRCULATION: Normal flow related enhancement of the included cervical, petrous, cavernous and supraclinoid internal carotid arteries. 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 LEFT ICA is developmentally larger. 2 mm superiorly directed aneurysm LEFT A1-2 junction. Patent anterior and middle cerebral arteries, including distal segments. No large vessel occlusion, flow limiting stenosis, aneurysm. POSTERIOR CIRCULATION: LEFT vertebral artery is dominant. Basilar artery is patent, with normal flow related enhancement of the main branch vessels. Patent posterior cerebral arteries. Fetal origin RIGHT posterior cerebral artery. Tiny LEFT posterior communicating artery present. No large vessel occlusion, flow limiting stenosis,  aneurysm. ANATOMIC VARIANTS: Aplastic RIGHT A1 segment. Source images and MIP images were reviewed. IMPRESSION: MRI HEAD: 1. Normal noncontrast MRI of the head for age. MRA HEAD: 1. No emergent large vessel occlusion or flow limiting stenosis. 2. 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 Neuro-Interventional Radiology consultation is suggested to evaluate the appropriateness of potential treatment. Non-emergent evaluation can be arranged by calling [phone] during usual hours. Emergency evaluation can be requested by paging [phone]. Electronically Signed   By: Awilda Metro M.D.   On: 12/06/2017 06:26   Mr Maxine Glenn Head Wo Contrast  Result Date: 12/06/2017 CLINICAL DATA:  Disoriented. Lower extremity weakness and RIGHT headache. EXAM: MRI HEAD WITHOUT CONTRAST MRA HEAD WITHOUT CONTRAST TECHNIQUE: Multiplanar, multiecho pulse sequences of the brain and surrounding structures were obtained without intravenous contrast. Angiographic images of the head were obtained using MRA technique without contrast. COMPARISON:  CT HEAD December 05, 2017 FINDINGS: MRI HEAD FINDINGS INTRACRANIAL CONTENTS:  No reduced diffusion to suggest  acute ischemia. No susceptibility artifact to suggest hemorrhage. The ventricles and sulci are normal for patient's age. No suspicious parenchymal signal, masses, mass effect. No abnormal extra-axial fluid collections. No extra-axial masses. VASCULAR: Normal major intracranial vascular flow voids present at skull base. SKULL AND UPPER CERVICAL SPINE: No abnormal sellar expansion. No suspicious calvarial bone marrow signal. Craniocervical junction maintained. SINUSES/ORBITS: The mastoid air-cells and included paranasal sinuses are well-aerated.The included ocular globes and orbital contents are non-suspicious. OTHER: None. MRA HEAD FINDINGS ANTERIOR CIRCULATION: Normal flow related enhancement of the included cervical, petrous, cavernous and supraclinoid internal carotid arteries. 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 LEFT ICA is developmentally larger. 2 mm superiorly directed aneurysm LEFT A1-2 junction. Patent anterior and middle cerebral arteries, including distal segments. No large vessel occlusion, flow limiting stenosis, aneurysm. POSTERIOR CIRCULATION: LEFT vertebral artery is dominant. Basilar artery is patent, with normal flow related enhancement of the main branch vessels. Patent posterior cerebral arteries. Fetal origin RIGHT posterior cerebral artery. Tiny LEFT posterior communicating artery present. No large vessel occlusion, flow limiting stenosis,  aneurysm. ANATOMIC VARIANTS: Aplastic RIGHT A1 segment. Source images and MIP images were reviewed. IMPRESSION: MRI HEAD: 1. Normal noncontrast MRI of the head for age. MRA HEAD: 1. No emergent large vessel occlusion or flow limiting stenosis. 2. 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 Neuro-Interventional Radiology consultation is suggested to evaluate the appropriateness of potential treatment. Non-emergent evaluation can be arranged by calling [phone] during usual hours. Emergency evaluation can  be requested by paging [phone]. Electronically Signed   By: Awilda Metro M.D.   On: 12/06/2017 06:26    Time Spent in minutes  30   Pearson Grippe M.D on 12/06/2017 at 4:48 PM  Between 7am to 7pm - Pager - [phone]  After 7pm go to [email or web address] - password [facility]  [facility] -  Office  [phone]

## 2017-12-06 NOTE — Progress Notes (Signed)
SLP Cancellation Note  Patient Details Name: Lodema HongBillie Tull MRN: [id number] DOB: 12/18/1945   Cancelled treatment:       Reason Eval/Treat Not Completed: SLP screened, no needs identified, will sign off   Blenda MountsCouture, Patrica Mendell Laurice 12/06/2017, 11:32 AM

## 2017-12-06 NOTE — Discharge Summary (Incomplete)
Allison Richardson, is a 72 y.o. female  DOB [birth year]  MRN [id number].  Admission date:  12/05/2017  Admitting Physician  Lorretta Harp, MD  Discharge Date:  12/06/2017   Primary MD  No primary care provider on file.  Recommendations for primary care physician for things to follow:    TIA 2mm A-comm aneurysm Cont Aspirin 325mg  po qday (started this admission) Cont Lipitor Please contact neurology for appointment for Dr. Anne Hahn   Hypertension  Cont metoprolol Cont amlodipine Cont Hyzaar   Hyperlipidemia Cont Lipitor  Gerd Cont PPI     Admission Diagnosis  TIA (transient ischemic attack) [G45.9]   Discharge Diagnosis  TIA (transient ischemic attack) [G45.9]     Principal Problem:   TIA (transient ischemic attack) Active Problems:   Essential hypertension   Hyperlipidemia      Past Medical History:  Diagnosis Date  . High cholesterol   . Hypertension     Past Surgical History:  Procedure Laterality Date  . ABDOMINAL HYSTERECTOMY    . BUNIONECTOMY Left        HPI  from the history and physical done on the day of admission:     Allison Richardson  is a 72 y.o. female, w hypertension, hyperlipidemia, gerd, apparently presents with right leg numbness, and heaviness this AM ,lasting for about 1 hour, she apparently lost control of her legs several times since yesterday at 3pm.  Some right sided headache today 30 minutes prior to arrival in ED.  Tele neurology consulted and Dr. Jacqlyn Krauss recommended TIA w/up and permissive hypertension.  Pt took 324mg  of aspirin today.   In ED,  CT brain negative  INR 0.87 PTT 26 Wbc 8.0, Hgb 12.9, Plt 266 Na 138, K 4.0, Bun 21, Creatinine 0.84 Ast 21, Alt 18  Pt will be admitted for w/up of TIA r/o CVA        Hospital Course:     Pt was admitted and MRI brain 12/06/2017  negative for acute CVA, MRA brain 12/06/2017=>  1. No emergent large  vessel occlusion or flow limiting stenosis. 2. 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 Neuro-Interventional Radiology consultation is suggested to evaluate the appropriateness of potential treatment. Non-emergent evaluation can be arranged by calling [phone] during usual hours.  Pt states no further symptoms.  Denies headache, slurred speech, vision change, cp, palp, sob, n/v, diarrhea, brbpr, focal neurological numbness, tingling, weakness.   I discussed case with Dr. Wilford Corner who states no intervention needed for this aneurysm.  Currently awaiting carotid ultrasound and cardiac 2D echo results, if negative, can d/c home with f/u with neurology     Follow UP     Consults obtained - neurology curbsided  Discharge Condition: stable  Diet and Activity recommendation: See Discharge Instructions below  Discharge Instructions         Discharge Medications     Allergies as of 12/06/2017      Reactions   Other    Has had adverse  reaction to a migraine medication   Penicillins Itching      Medication List    STOP taking these medications   aspirin 81 MG chewable tablet Replaced by:  aspirin 325 MG tablet     TAKE these medications   amLODipine 5 MG tablet Commonly known as:  NORVASC Take 2.5 mg by mouth daily.   aspirin 325 MG tablet Take 1 tablet (325 mg total) by mouth daily. Start taking on:  12/07/2017 Replaces:  aspirin 81 MG chewable tablet   atorvastatin 10 MG tablet Commonly known as:  LIPITOR Take 10 mg by mouth daily.   famotidine-calcium carbonate-magnesium hydroxide 10-800-165 MG chewable tablet Commonly known as:  PEPCID COMPLETE Chew 1 tablet by mouth daily as needed (heartburn).   losartan-hydrochlorothiazide 100-12.5 MG tablet Commonly known as:  HYZAAR Take 1 tablet by mouth daily.   metoprolol succinate 25 MG 24 hr tablet Commonly known as:  TOPROL-XL Take 25 mg by mouth daily.   omeprazole 40 MG  capsule Commonly known as:  PRILOSEC Take 40 mg by mouth daily. What changed:  Another medication with the same name was removed. Continue taking this medication, and follow the directions you see here.   PRO-BIOTIC BLEND PO Take 1 tablet by mouth daily.       Major procedures and Radiology Reports - PLEASE review detailed and final reports for all details, in brief -   ***   Ct Head Wo Contrast  Result Date: 12/05/2017 CLINICAL DATA:  Pt states she has numbness in her right leg, legs gave out yesterday, headache, pt states she just felt "weird", no blurred vision, no hx stroke, HTN on many meds, mild headache right frontal area EXAM: CT HEAD WITHOUT CONTRAST TECHNIQUE: Contiguous axial images were obtained from the base of the skull through the vertex without intravenous contrast. COMPARISON:  None. FINDINGS: Brain: No evidence of acute infarction, hemorrhage, hydrocephalus, extra-axial collection or mass lesion/mass effect. Vascular: No hyperdense vessel or unexpected calcification. Skull: Normal. Negative for fracture or focal lesion. Sinuses/Orbits: Globes and orbits are unremarkable. Visualized sinuses and mastoid air cells are clear. Other: None. IMPRESSION: Normal unenhanced CT scan of the brain. Electronically Signed   By: Amie Portlandavid  Ormond M.D.   On: 12/05/2017 17:16   Mr Brain Wo Contrast  Result Date: 12/06/2017 CLINICAL DATA:  Disoriented. Lower extremity weakness and RIGHT headache. EXAM: MRI HEAD WITHOUT CONTRAST MRA HEAD WITHOUT CONTRAST TECHNIQUE: Multiplanar, multiecho pulse sequences of the brain and surrounding structures were obtained without intravenous contrast. Angiographic images of the head were obtained using MRA technique without contrast. COMPARISON:  CT HEAD December 05, 2017 FINDINGS: MRI HEAD FINDINGS INTRACRANIAL CONTENTS: No reduced diffusion to suggest acute ischemia. No susceptibility artifact to suggest hemorrhage. The ventricles and sulci are normal for  patient's age. No suspicious parenchymal signal, masses, mass effect. No abnormal extra-axial fluid collections. No extra-axial masses. VASCULAR: Normal major intracranial vascular flow voids present at skull base. SKULL AND UPPER CERVICAL SPINE: No abnormal sellar expansion. No suspicious calvarial bone marrow signal. Craniocervical junction maintained. SINUSES/ORBITS: The mastoid air-cells and included paranasal sinuses are well-aerated.The included ocular globes and orbital contents are non-suspicious. OTHER: None. MRA HEAD FINDINGS ANTERIOR CIRCULATION: Normal flow related enhancement of the included cervical, petrous, cavernous and supraclinoid internal carotid arteries. 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 LEFT ICA is developmentally larger. 2 mm superiorly directed aneurysm LEFT A1-2 junction. Patent anterior and middle cerebral arteries, including distal segments. No large vessel occlusion, flow limiting  stenosis, aneurysm. POSTERIOR CIRCULATION: LEFT vertebral artery is dominant. Basilar artery is patent, with normal flow related enhancement of the main branch vessels. Patent posterior cerebral arteries. Fetal origin RIGHT posterior cerebral artery. Tiny LEFT posterior communicating artery present. No large vessel occlusion, flow limiting stenosis,  aneurysm. ANATOMIC VARIANTS: Aplastic RIGHT A1 segment. Source images and MIP images were reviewed. IMPRESSION: MRI HEAD: 1. Normal noncontrast MRI of the head for age. MRA HEAD: 1. No emergent large vessel occlusion or flow limiting stenosis. 2. 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 Neuro-Interventional Radiology consultation is suggested to evaluate the appropriateness of potential treatment. Non-emergent evaluation can be arranged by calling [phone] during usual hours. Emergency evaluation can be requested by paging [phone]. Electronically Signed   By: Awilda Metro M.D.   On: 12/06/2017  06:26   Mr Maxine Glenn Head Wo Contrast  Result Date: 12/06/2017 CLINICAL DATA:  Disoriented. Lower extremity weakness and RIGHT headache. EXAM: MRI HEAD WITHOUT CONTRAST MRA HEAD WITHOUT CONTRAST TECHNIQUE: Multiplanar, multiecho pulse sequences of the brain and surrounding structures were obtained without intravenous contrast. Angiographic images of the head were obtained using MRA technique without contrast. COMPARISON:  CT HEAD December 05, 2017 FINDINGS: MRI HEAD FINDINGS INTRACRANIAL CONTENTS: No reduced diffusion to suggest acute ischemia. No susceptibility artifact to suggest hemorrhage. The ventricles and sulci are normal for patient's age. No suspicious parenchymal signal, masses, mass effect. No abnormal extra-axial fluid collections. No extra-axial masses. VASCULAR: Normal major intracranial vascular flow voids present at skull base. SKULL AND UPPER CERVICAL SPINE: No abnormal sellar expansion. No suspicious calvarial bone marrow signal. Craniocervical junction maintained. SINUSES/ORBITS: The mastoid air-cells and included paranasal sinuses are well-aerated.The included ocular globes and orbital contents are non-suspicious. OTHER: None. MRA HEAD FINDINGS ANTERIOR CIRCULATION: Normal flow related enhancement of the included cervical, petrous, cavernous and supraclinoid internal carotid arteries. 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 LEFT ICA is developmentally larger. 2 mm superiorly directed aneurysm LEFT A1-2 junction. Patent anterior and middle cerebral arteries, including distal segments. No large vessel occlusion, flow limiting stenosis, aneurysm. POSTERIOR CIRCULATION: LEFT vertebral artery is dominant. Basilar artery is patent, with normal flow related enhancement of the main branch vessels. Patent posterior cerebral arteries. Fetal origin RIGHT posterior cerebral artery. Tiny LEFT posterior communicating artery present. No large vessel occlusion, flow limiting stenosis,   aneurysm. ANATOMIC VARIANTS: Aplastic RIGHT A1 segment. Source images and MIP images were reviewed. IMPRESSION: MRI HEAD: 1. Normal noncontrast MRI of the head for age. MRA HEAD: 1. No emergent large vessel occlusion or flow limiting stenosis. 2. 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 Neuro-Interventional Radiology consultation is suggested to evaluate the appropriateness of potential treatment. Non-emergent evaluation can be arranged by calling [phone] during usual hours. Emergency evaluation can be requested by paging [phone]. Electronically Signed   By: Awilda Metro M.D.   On: 12/06/2017 06:26    Micro Results   ***  No results found for this or any previous visit (from the past 240 hour(s)).     Today   Subjective    Allison Richardson today has no headache,no chest abdominal pain,no new weakness tingling or numbness, feels much better wants to go home today. ****   Objective   Blood pressure (!) 109/53, pulse (!) 58, temperature 97.6 F (36.4 C), temperature source Oral, resp. rate 17, height 5\' 7"  (1.702 m), weight 81.9 kg (180 lb 8.9 oz), SpO2 98 %.  No intake or output  data in the 24 hours ending 12/06/17 1407  Exam Awake Alert, Oriented x 3, No new F.N deficits, Normal affect [place].AT,PERRAL Supple Neck,No JVD, No cervical lymphadenopathy appriciated.  Symmetrical Chest wall movement, Good air movement bilaterally, CTAB RRR,No Gallops,Rubs or new Murmurs, No Parasternal Heave +ve B.Sounds, Abd Soft, Non tender, No organomegaly appriciated, No rebound -guarding or rigidity. No Cyanosis, Clubbing or edema, No new Rash or bruise   Data Review   CBC w Diff:  Lab Results  Component Value Date   WBC 6.2 12/06/2017   HGB 13.2 12/06/2017   HCT 40.2 12/06/2017   PLT 254 12/06/2017   LYMPHOPCT 25 12/05/2017   MONOPCT 7 12/05/2017   EOSPCT 1 12/05/2017   BASOPCT 0 12/05/2017    CMP:  Lab Results  Component Value Date   NA 139  12/06/2017   K 3.6 12/06/2017   CL 103 12/06/2017   CO2 25 12/06/2017   BUN 13 12/06/2017   CREATININE 0.80 12/06/2017   PROT 6.8 12/06/2017   ALBUMIN 3.9 12/06/2017   BILITOT 0.8 12/06/2017   ALKPHOS 67 12/06/2017   AST 28 12/06/2017   ALT 17 12/06/2017  .   Total Time in preparing paper work, data evaluation and todays exam - 35 minutes  Pearson Grippe M.D on 12/06/2017 at 2:07 PM  [facility]   Office  [phone]

## 2017-12-06 NOTE — Evaluation (Addendum)
Occupational Therapy Evaluation and Discharge Patient Details Name: Allison Richardson MRN: [id number] DOB: 1945/12/15 Today's Date: 12/06/2017    History of Present Illness Allison Richardson  is a 72 y.o. female, w hypertension, hyperlipidemia, gerd, apparently presents with right leg numbness, and heaviness this AM ,lasting for about 1 hour, she apparently lost control of her legs several times since yesterday at 3pm. Admitted for TIA work-up. 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   Clinical Impression   Patient evaluated by Occupational Therapy with no further acute OT needs identified. She is able to complete ADL independently in hospital setting. All deficits have resolved. All education has been completed and the patient has no further questions. See below for any follow-up Occupational Therapy or equipment needs. OT to sign off. Thank you for referral.      Follow Up Recommendations  No OT follow up    Equipment Recommendations  None recommended by OT    Recommendations for Other Services       Precautions / Restrictions Precautions Precautions: None Restrictions Weight Bearing Restrictions: No      Mobility Bed Mobility Overal bed mobility: Independent                Transfers Overall transfer level: Independent                    Balance Overall balance assessment: No apparent balance deficits (not formally assessed)                               Standardized Balance Assessment Standardized Balance Assessment : Berg Balance Test Berg Balance Test Sit to Stand: Able to stand without using hands and stabilize independently Standing Unsupported: Able to stand safely 2 minutes Sitting with Back Unsupported but Feet Supported on Floor or Stool: Able to sit safely and securely 2 minutes Stand to Sit: Sits safely with minimal use of hands Transfers: Able to transfer safely, minor use of hands Standing  Unsupported with Eyes Closed: Able to stand 10 seconds safely Standing Ubsupported with Feet Together: Able to place feet together independently and stand 1 minute safely From Standing, Reach Forward with Outstretched Arm: Can reach confidently >25 cm (10") From Standing Position, Pick up Object from Floor: Able to pick up shoe safely and easily From Standing Position, Turn to Look Behind Over each Shoulder: Looks behind from both sides and weight shifts well Turn 360 Degrees: Able to turn 360 degrees safely in 4 seconds or less Standing Unsupported, Alternately Place Feet on Step/Stool: Able to stand independently and safely and complete 8 steps in 20 seconds Standing Unsupported, One Foot in Front: Able to place foot tandem independently and hold 30 seconds Standing on One Leg: Able to lift leg independently and hold > 10 seconds Total Score: 56       ADL either performed or assessed with clinical judgement   ADL Overall ADL's : Independent                                             Vision Baseline Vision/History: Wears glasses Wears Glasses: Distance only Patient Visual Report: No change from baseline Vision Assessment?: No apparent visual deficits     Perception     Praxis      Pertinent  Vitals/Pain Pain Assessment: No/denies pain     Hand Dominance     Extremity/Trunk Assessment Upper Extremity Assessment Upper Extremity Assessment: Overall WFL for tasks assessed   Lower Extremity Assessment Lower Extremity Assessment: Overall WFL for tasks assessed   Cervical / Trunk Assessment Cervical / Trunk Assessment: Normal   Communication Communication Communication: No difficulties   Cognition Arousal/Alertness: Awake/alert Behavior During Therapy: WFL for tasks assessed/performed Overall Cognitive Status: Within Functional Limits for tasks assessed                                 General Comments: denies falls   General Comments   Able to adjust bed linens and pick items up from floor indepedently    Exercises     Shoulder Instructions      Home Living Family/patient expects to be discharged to:: Private residence Living Arrangements: Spouse/significant other Available Help at Discharge: Family Type of Home: Independent living facility Home Access: Level entry     Home Layout: One level     Bathroom Shower/Tub: Chief Strategy OfficerTub/shower unit   Bathroom Toilet: Handicapped height     Home Equipment: Grab bars - toilet;Grab bars - tub/shower          Prior Functioning/Environment Level of Independence: Independent        Comments: Pt walks her dogs 2 miles a day and does yoga and goes to the gym 3x a week        OT Problem List: Decreased knowledge of precautions      OT Treatment/Interventions:      OT Goals(Current goals can be found in the care plan section) Acute Rehab OT Goals Patient Stated Goal: to get back home and make sure it doesnt happen again OT Goal Formulation: With patient  OT Frequency:     Barriers to D/C:            Co-evaluation              AM-PAC PT "6 Clicks" Daily Activity     Outcome Measure Help from another person eating meals?: None Help from another person taking care of personal grooming?: None Help from another person toileting, which includes using toliet, bedpan, or urinal?: None Help from another person bathing (including washing, rinsing, drying)?: None Help from another person to put on and taking off regular upper body clothing?: None Help from another person to put on and taking off regular lower body clothing?: None 6 Click Score: 24   End of Session    Activity Tolerance: Patient tolerated treatment well Patient left: in bed;with call bell/phone within reach  OT Visit Diagnosis: Other abnormalities of gait and mobility (R26.89)                Time: 9604-5409: 0827-0835 OT Time Calculation (min): 8 min Charges:  OT General Charges $OT Visit: 1 Visit OT  Evaluation $OT Eval Low Complexity: 1 Low G-Codes:     Doristine Sectionharity A Princetta Uplinger, MS OTR/L  Pager: [phone]   Karlon Schlafer A Maigen Mozingo 12/06/2017, 9:18 AM

## 2017-12-06 NOTE — Evaluation (Signed)
Physical Therapy Evaluation Patient Details Name: Allison Richardson MRN: [id number] DOB: 06-21-1946 Today's Date: 12/06/2017   History of Present Illness  Allison Richardson  is a 72 y.o. female, w hypertension, hyperlipidemia, gerd, apparently presents with right leg numbness, and heaviness this AM ,lasting for about 1 hour, she apparently lost control of her legs several times since yesterday at 3pm.  pt with TIA  Clinical Impression  Pt is currently at her baseline.  Perfect BERG balance score.  No mobilty deficits found at this time.      Follow Up Recommendations No PT follow up(Pt encouraged to continue with her current HEP)    Equipment Recommendations  None recommended by PT    Recommendations for Other Services       Precautions / Restrictions Precautions Precautions: None      Mobility  Bed Mobility Overal bed mobility: Independent                Transfers Overall transfer level: Independent                  Ambulation/Gait Ambulation/Gait assistance: Independent   Assistive device: None Gait Pattern/deviations: WFL(Within Functional Limits)        Stairs            Wheelchair Mobility    Modified Rankin (Stroke Patients Only)       Balance                                 Standardized Balance Assessment Standardized Balance Assessment : Berg Balance Test Berg Balance Test Sit to Stand: Able to stand without using hands and stabilize independently Standing Unsupported: Able to stand safely 2 minutes Sitting with Back Unsupported but Feet Supported on Floor or Stool: Able to sit safely and securely 2 minutes Stand to Sit: Sits safely with minimal use of hands Transfers: Able to transfer safely, minor use of hands Standing Unsupported with Eyes Closed: Able to stand 10 seconds safely Standing Ubsupported with Feet Together: Able to place feet together independently and stand 1 minute safely From Standing, Reach Forward with  Outstretched Arm: Can reach confidently >25 cm (10") From Standing Position, Pick up Object from Floor: Able to pick up shoe safely and easily From Standing Position, Turn to Look Behind Over each Shoulder: Looks behind from both sides and weight shifts well Turn 360 Degrees: Able to turn 360 degrees safely in 4 seconds or less Standing Unsupported, Alternately Place Feet on Step/Stool: Able to stand independently and safely and complete 8 steps in 20 seconds Standing Unsupported, One Foot in Front: Able to place foot tandem independently and hold 30 seconds Standing on One Leg: Able to lift leg independently and hold > 10 seconds Total Score: 56         Pertinent Vitals/Pain Pain Assessment: No/denies pain    Home Living Family/patient expects to be discharged to:: Private residence Living Arrangements: Spouse/significant other   Type of Home: House       Home Layout: One level Home Equipment: None      Prior Function Level of Independence: Independent         Comments: Pt walks her dogs 2 miles a day and does yoga and goes to the gym 3x a week     Hand Dominance        Extremity/Trunk Assessment        Lower Extremity Assessment Lower Extremity Assessment:  Overall [facility]FL for tasks assessed    Cervical / Trunk Assessment Cervical / Trunk Assessment: Normal  Communication   Communication: No difficulties  Cognition Arousal/Alertness: Awake/alert Behavior During Therapy: WFL for tasks assessed/performed                                   General Comments: denies falls      General Comments General comments (skin integrity, edema, etc.): no loss of balance elicited with general balance testing.    Exercises     Assessment/Plan    PT Assessment Patent does not need any further PT services  PT Problem List         PT Treatment Interventions      PT Goals (Current goals can be found in the Care Plan section)  Acute Rehab PT  Goals Patient Stated Goal: to get back home and make sure it doesnt happen again PT Goal Formulation: With patient Time For Goal Achievement: 12/06/17 Potential to Achieve Goals: Good    Frequency     Barriers to discharge        Co-evaluation               AM-PAC PT "6 Clicks" Daily Activity  Outcome Measure Difficulty turning over in bed (including adjusting bedclothes, sheets and blankets)?: None Difficulty moving from lying on back to sitting on the side of the bed? : None Difficulty sitting down on and standing up from a chair with arms (e.g., wheelchair, bedside commode, etc,.)?: None Help needed moving to and from a bed to chair (including a wheelchair)?: None Help needed walking in hospital room?: None Help needed climbing 3-5 steps with a railing? : None 6 Click Score: 24    End of Session   Activity Tolerance: Patient tolerated treatment well Patient left: in bed;with call bell/phone within reach Nurse Communication: Mobility status      Time: [id number] PT Time Calculation (min) (ACUTE ONLY): 12 min   Charges:   PT Evaluation $PT Eval Low Complexity: 1 Low     PT G Codes:        12/06/2017   Ranae PalmsElizabeth Emmery Seiler, PT   Judson RochHildreth, Quinlin Conant Gardner 12/06/2017, 9:03 AM

## 2017-12-07 ENCOUNTER — Inpatient Hospital Stay (HOSPITAL_COMMUNITY): Payer: Medicare Other

## 2017-12-07 ENCOUNTER — Other Ambulatory Visit: Payer: Self-pay | Admitting: Radiology

## 2017-12-07 DIAGNOSIS — I671 Cerebral aneurysm, nonruptured: Secondary | ICD-10-CM

## 2017-12-07 DIAGNOSIS — G459 Transient cerebral ischemic attack, unspecified: Principal | ICD-10-CM

## 2017-12-07 LAB — ECHOCARDIOGRAM COMPLETE
HEIGHTINCHES: 67 in
Weight: 2888.91 oz

## 2017-12-07 LAB — HEMOGLOBIN A1C
Hgb A1c MFr Bld: 6.1 % — ABNORMAL HIGH (ref 4.8–5.6)
Mean Plasma Glucose: 128 mg/dL

## 2017-12-07 MED ORDER — ASPIRIN EC 81 MG PO TBEC: 81.0000 mg | DELAYED_RELEASE_TABLET | Freq: Every day | ORAL | 2 refills | Status: AC

## 2017-12-07 MED ORDER — CLOPIDOGREL BISULFATE 75 MG PO TABS: 75.0000 mg | ORAL_TABLET | Freq: Every day | ORAL | 11 refills | Status: DC

## 2017-12-07 MED ORDER — METOPROLOL SUCCINATE ER 25 MG PO TB24: 12.5000 mg | ORAL_TABLET | Freq: Every day | ORAL | Status: DC

## 2017-12-07 NOTE — Care Management Note (Signed)
Case Management Note  Patient Details  Name: Allison Richardson MRN: [id number] Date of Birth: 02/04/1946  Subjective/Objective:   Pt in with TIA. She is from home with spouse.   PCP: Dr  Leroy KennedyPiazza          Action/Plan: No f/u per PT/OT and no DME needs. Pt has transportation home.   Expected Discharge Date:  12/07/17               Expected Discharge Plan:  Home/Self Care  In-House Referral:     Discharge planning Services     Post Acute Care Choice:    Choice offered to:     DME Arranged:    DME Agency:     HH Arranged:    HH Agency:     Status of Service:  Completed, signed off  If discussed at [employer] Length of Stay Meetings, dates discussed:    Additional Comments:  Kermit BaloKelli F Simcha Farrington, RN 12/07/2017, 1:16 PM

## 2017-12-07 NOTE — Discharge Instructions (Signed)
Your blood sugars are mildly elevated. Please limit the amount of carbohydrates that you eat.  Follow your BP closely and keep a log for your family doctor. Do not take Toprol for now.   Please take all your medications with you for your next visit with your Primary MD. Please request your Primary MD to go over all hospital test results at the follow up. Please ask your Primary MD to get all Hospital records sent to his/her office.  If you experience worsening of your admission symptoms, develop shortness of breath, chest pain, suicidal or homicidal thoughts or a life threatening emergency, you must seek medical attention immediately by calling 911 or calling your MD.  Allison QuinYou must read the complete instructions/literature along with all the possible adverse reactions/side effects for all the medicines you take including new medications that have been prescribed to you. Take new medicines after you have completely understood and accpet all the possible adverse reactions/side effects.   Do not drive when taking pain medications or sedatives.    Do not take more than prescribed Pain, Sleep and Anxiety Medications  If you have smoked or chewed Tobacco in the last 2 yrs please stop. Stop any regular alcohol and or recreational drug use.  Wear Seat belts while driving.

## 2017-12-07 NOTE — Discharge Summary (Signed)
Physician Discharge Summary  Allison Richardson ZOX:[id number] DOB: 23-Feb-1946 DOA: 12/05/2017  PCP: Nadara Eaton, MD  Admit date: 12/05/2017 Discharge date: 12/07/2017  Admitted From: home  Disposition:  home   Recommendations for Outpatient Follow-up:  1. Will need event monitor and follow up with neuro interventional radiology  2. F/u BP in 1 wk- Toprol being held Discharge Condition:  stable   CODE STATUS:  Full code   Consultations:  Neurology     Discharge Diagnoses:  Principal Problem:   TIA (transient ischemic attack) Active Problems:   Essential hypertension   Hyperlipidemia   Subjective: Her symptoms of numbness have resolved and have not recurred.  Brief Summary: Allison Richardson  is a 72 y.o. female, w hypertension, hyperlipidemia, gerd, apparently presents with right leg numbness, and heaviness this AM ,lasting for about 1 hour, she apparently lost control of her legs several times since yesterday at 3pm.  Some right sided headache today 30 minutes prior to arrival in ED.  Tele neurology consulted and Dr. Jacqlyn Krauss recommended TIA w/up and permissive hypertension.  Pt took 324mg  of aspirin today.   Hospital Course:  TIAs  - in addition to the above mentioned episode, she has another similar episode recently - MRI does not reveal an acute infarct - MRA does reveal 2 aneurysms (discussed below) -  Per notes from cardiology, she has a h/o SVT- at this point, I have discussed wearing an event monitor to evaluate for A-fib- I have spoken with her cardiologist, Dr Bary Castilla from [facility] who agrees with this- he also has advised to hold Toprol for now as both HR and BP are low- he will follow up in the office - Neurology further recommends taking Plavix in addition to ASA 81 mg for 3 wks to prevent further TIAs or a CVA - cholesterol levels are normal - Carotid duplex does not reveal and carotid stenosis- vertebral flow is antegrade - 2 D ECHO: Left ventricle: The cavity  size was normal. Wall thickness was   increased in a pattern of mild LVH. Systolic function was normal.   The estimated ejection fraction was in the range of 60% to 65%. - Mitral valve: There was mild regurgitation. - Left atrium: The atrium was mildly dilated. 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-  see report below - d/w neuro interventional radiology- they recommend outpt follow up as Dr Corliss Skains is currently on vacation - I have discussed this with the patient- contact number given  Hypotension  -  Norvasc was recently cut in half due to low bP - as BP is still low today, will be stopping Toprol per discussion above with her cardiologist  Glucose intolerance - A1c is found to be 6.1- PCP to follow up on this- have advised diet control and weight loss  GERD - controlled      Discharge Exam: Vitals:   12/07/17 0303 12/07/17 0958  BP: (!) 101/50 (!) 103/55  Pulse: (!) 54 (!) 52  Resp: 17 18  Temp: 97.7 F (36.5 C) 97.7 F (36.5 C)  SpO2: 99% 99%   Vitals:   12/06/17 2207 12/07/17 0036 12/07/17 0303 12/07/17 0958  BP:  (!) 111/58 (!) 101/50 (!) 103/55  Pulse: 62 60 (!) 54 (!) 52  Resp:  17 17 18   Temp:  97.9 F (36.6 C) 97.7 F (36.5 C) 97.7 F (36.5 C)  TempSrc:  Oral Oral Oral  SpO2:  96% 99% 99%  Weight:      Height:        General: Pt is alert, awake, not in acute distress Cardiovascular: RRR, S1/S2 +, no rubs, no gallops Respiratory: CTA bilaterally, no wheezing, no rhonchi Abdominal: Soft, NT, ND, bowel sounds + Extremities: no edema, no cyanosis   Discharge Instructions  Discharge Instructions    Diet - low sodium heart healthy   Complete by:  As directed    Increase activity slowly   Complete by:  As directed      Allergies as of 12/07/2017      Reactions   Other    Has had adverse reaction to a migraine medication   Penicillins Itching      Medication List    STOP taking these medications    aspirin 81 MG chewable tablet Replaced by:  aspirin EC 81 MG tablet     TAKE these medications   amLODipine 5 MG tablet Commonly known as:  NORVASC Take 2.5 mg by mouth daily.   aspirin EC 81 MG tablet Take 1 tablet (81 mg total) by mouth daily. Replaces:  aspirin 81 MG chewable tablet   atorvastatin 10 MG tablet Commonly known as:  LIPITOR Take 10 mg by mouth daily.   clopidogrel 75 MG tablet Commonly known as:  PLAVIX Take 1 tablet (75 mg total) by mouth daily.   famotidine-calcium carbonate-magnesium hydroxide 10-800-165 MG chewable tablet Commonly known as:  PEPCID COMPLETE Chew 1 tablet by mouth daily as needed (heartburn).   losartan-hydrochlorothiazide 100-12.5 MG tablet Commonly known as:  HYZAAR Take 1 tablet by mouth daily.   metoprolol succinate 25 MG 24 hr tablet Commonly known as:  TOPROL-XL Take 25 mg by mouth daily.   omeprazole 40 MG capsule Commonly known as:  PRILOSEC Take 40 mg by mouth daily. What changed:  Another medication with the same name was removed. Continue taking this medication, and follow the directions you see here.   PRO-BIOTIC BLEND PO Take 1 tablet by mouth daily.      Follow-up Information    York Spaniel, MD Follow up in 2 week(s).   Specialty:  Neurology Contact information: [address] [place] [address] [phone]        Julieanne Cotton, MD Follow up.   Specialties:  Interventional Radiology, Radiology Why:  f/u on brain aneurysms Contact information: [address] [place] [address] [phone]          Allergies  Allergen Reactions  . Other     Has had adverse reaction to a migraine medication  . Penicillins Itching     Procedures/Studies:  2 D ECHO Carotid duplex  Ct Head Wo Contrast  Result Date: 12/05/2017 CLINICAL DATA:  Pt states she has numbness in her right leg, legs gave out yesterday, headache, pt states she just felt "weird", no blurred  vision, no hx stroke, HTN on many meds, mild headache right frontal area EXAM: CT HEAD WITHOUT CONTRAST TECHNIQUE: Contiguous axial images were obtained from the base of the skull through the vertex without intravenous contrast. COMPARISON:  None. FINDINGS: Brain: No evidence of acute infarction, hemorrhage, hydrocephalus, extra-axial collection or mass lesion/mass effect. Vascular: No hyperdense vessel or unexpected calcification. Skull: Normal. Negative for fracture or focal lesion. Sinuses/Orbits: Globes and orbits are unremarkable. Visualized sinuses and mastoid air cells are clear. Other: None. IMPRESSION: Normal unenhanced CT scan of the brain. Electronically Signed   By: Renard Hamper.D.  On: 12/05/2017 17:16   Mr Brain Wo Contrast  Result Date: 12/06/2017 CLINICAL DATA:  Disoriented. Lower extremity weakness and RIGHT headache. EXAM: MRI HEAD WITHOUT CONTRAST MRA HEAD WITHOUT CONTRAST TECHNIQUE: Multiplanar, multiecho pulse sequences of the brain and surrounding structures were obtained without intravenous contrast. Angiographic images of the head were obtained using MRA technique without contrast. COMPARISON:  CT HEAD December 05, 2017 FINDINGS: MRI HEAD FINDINGS INTRACRANIAL CONTENTS: No reduced diffusion to suggest acute ischemia. No susceptibility artifact to suggest hemorrhage. The ventricles and sulci are normal for patient's age. No suspicious parenchymal signal, masses, mass effect. No abnormal extra-axial fluid collections. No extra-axial masses. VASCULAR: Normal major intracranial vascular flow voids present at skull base. SKULL AND UPPER CERVICAL SPINE: No abnormal sellar expansion. No suspicious calvarial bone marrow signal. Craniocervical junction maintained. SINUSES/ORBITS: The mastoid air-cells and included paranasal sinuses are well-aerated.The included ocular globes and orbital contents are non-suspicious. OTHER: None. MRA HEAD FINDINGS ANTERIOR CIRCULATION: Normal flow related  enhancement of the included cervical, petrous, cavernous and supraclinoid internal carotid arteries. 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 LEFT ICA is developmentally larger. 2 mm superiorly directed aneurysm LEFT A1-2 junction. Patent anterior and middle cerebral arteries, including distal segments. No large vessel occlusion, flow limiting stenosis, aneurysm. POSTERIOR CIRCULATION: LEFT vertebral artery is dominant. Basilar artery is patent, with normal flow related enhancement of the main branch vessels. Patent posterior cerebral arteries. Fetal origin RIGHT posterior cerebral artery. Tiny LEFT posterior communicating artery present. No large vessel occlusion, flow limiting stenosis,  aneurysm. ANATOMIC VARIANTS: Aplastic RIGHT A1 segment. Source images and MIP images were reviewed. IMPRESSION: MRI HEAD: 1. Normal noncontrast MRI of the head for age. MRA HEAD: 1. No emergent large vessel occlusion or flow limiting stenosis. 2. 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 Neuro-Interventional Radiology consultation is suggested to evaluate the appropriateness of potential treatment. Non-emergent evaluation can be arranged by calling [phone] during usual hours. Emergency evaluation can be requested by paging [phone]. Electronically Signed   By: Awilda Metroourtnay  Bloomer M.D.   On: 12/06/2017 06:26   Mr Maxine GlennMra Head Wo Contrast  Result Date: 12/06/2017 CLINICAL DATA:  Disoriented. Lower extremity weakness and RIGHT headache. EXAM: MRI HEAD WITHOUT CONTRAST MRA HEAD WITHOUT CONTRAST TECHNIQUE: Multiplanar, multiecho pulse sequences of the brain and surrounding structures were obtained without intravenous contrast. Angiographic images of the head were obtained using MRA technique without contrast. COMPARISON:  CT HEAD December 05, 2017 FINDINGS: MRI HEAD FINDINGS INTRACRANIAL CONTENTS: No reduced diffusion to suggest acute ischemia. No susceptibility artifact to  suggest hemorrhage. The ventricles and sulci are normal for patient's age. No suspicious parenchymal signal, masses, mass effect. No abnormal extra-axial fluid collections. No extra-axial masses. VASCULAR: Normal major intracranial vascular flow voids present at skull base. SKULL AND UPPER CERVICAL SPINE: No abnormal sellar expansion. No suspicious calvarial bone marrow signal. Craniocervical junction maintained. SINUSES/ORBITS: The mastoid air-cells and included paranasal sinuses are well-aerated.The included ocular globes and orbital contents are non-suspicious. OTHER: None. MRA HEAD FINDINGS ANTERIOR CIRCULATION: Normal flow related enhancement of the included cervical, petrous, cavernous and supraclinoid internal carotid arteries. 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 LEFT ICA is developmentally larger. 2 mm superiorly directed aneurysm LEFT A1-2 junction. Patent anterior and middle cerebral arteries, including distal segments. No large vessel occlusion, flow limiting stenosis, aneurysm. POSTERIOR CIRCULATION: LEFT vertebral artery is dominant. Basilar artery is patent, with normal flow related enhancement of the main branch vessels. Patent posterior cerebral  arteries. Fetal origin RIGHT posterior cerebral artery. Tiny LEFT posterior communicating artery present. No large vessel occlusion, flow limiting stenosis,  aneurysm. ANATOMIC VARIANTS: Aplastic RIGHT A1 segment. Source images and MIP images were reviewed. IMPRESSION: MRI HEAD: 1. Normal noncontrast MRI of the head for age. MRA HEAD: 1. No emergent large vessel occlusion or flow limiting stenosis. 2. 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 Neuro-Interventional Radiology consultation is suggested to evaluate the appropriateness of potential treatment. Non-emergent evaluation can be arranged by calling [phone] during usual hours. Emergency evaluation can be requested by paging [phone].  Electronically Signed   By: Awilda Metro M.D.   On: 12/06/2017 06:26     The results of significant diagnostics from this hospitalization (including imaging, microbiology, ancillary and laboratory) are listed below for reference.     Microbiology: No results found for this or any previous visit (from the past 240 hour(s)).   Labs: BNP (last 3 results) No results for input(s): BNP in the last 8760 hours. Basic Metabolic Panel: Recent Labs  Lab 12/05/17 1703 12/06/17 0617  NA 138 139  K 4.0 3.6  CL 103 103  CO2 26 25  GLUCOSE 89 95  BUN 21* 13  CREATININE 0.84 0.80  CALCIUM 9.1 9.0   Liver Function Tests: Recent Labs  Lab 12/05/17 1703 12/06/17 0617  AST 21 28  ALT 18 17  ALKPHOS 71 67  BILITOT 0.3 0.8  PROT 7.1 6.8  ALBUMIN 4.2 3.9   No results for input(s): LIPASE, AMYLASE in the last 168 hours. No results for input(s): AMMONIA in the last 168 hours. CBC: Recent Labs  Lab 12/05/17 1703 12/06/17 0617  WBC 8.0 6.2  NEUTROABS 5.4  --   HGB 12.9 13.2  HCT 38.3 40.2  MCV 86.7 87.4  PLT 266 254   Cardiac Enzymes: Recent Labs  Lab 12/05/17 1703  TROPONINI <0.03   BNP: Invalid input(s): POCBNP CBG: Recent Labs  Lab 12/05/17 1703  GLUCAP 76   D-Dimer No results for input(s): DDIMER in the last 72 hours. Hgb A1c Recent Labs    12/06/17 0617  HGBA1C 6.1*   Lipid Profile Recent Labs    12/06/17 0617  CHOL 154  HDL 49  LDLCALC 86  TRIG 93  CHOLHDL 3.1   Thyroid function studies No results for input(s): TSH, T4TOTAL, T3FREE, THYROIDAB in the last 72 hours.  Invalid input(s): FREET3 Anemia work up No results for input(s): VITAMINB12, FOLATE, FERRITIN, TIBC, IRON, RETICCTPCT in the last 72 hours. Urinalysis No results found for: COLORURINE, APPEARANCEUR, LABSPEC, PHURINE, GLUCOSEU, HGBUR, BILIRUBINUR, KETONESUR, PROTEINUR, UROBILINOGEN, NITRITE, LEUKOCYTESUR Sepsis Labs Invalid input(s): PROCALCITONIN,  WBC,   LACTICIDVEN Microbiology No results found for this or any previous visit (from the past 240 hour(s)).   Time coordinating discharge: Over 30 minutes  SIGNED:   Calvert Cantor, MD  [facility] 12/07/2017, 1:05 PM Pager   If 7PM-7AM, please contact night-coverage [email or web address] Password [email or web address]

## 2017-12-07 NOTE — Progress Notes (Signed)
VASCULAR LAB PRELIMINARY  PRELIMINARY  PRELIMINARY  PRELIMINARY  Carotid duplex completed.    Preliminary report:  1-39% ICA plaquing. Vertebral artery flow is antegrade.  Julie Paolini, RVT 12/07/2017, 8:36 AM

## 2017-12-07 NOTE — Consult Note (Addendum)
Requesting Physician: Dr. Butler Denmark    Chief Complaint: right leg numbness  History obtained from:  Patient     HPI:                                                                                                                                         Allison Richardson is an 72 y.o. female who presented 2 days prior with right leg numbness, and heaviness at 4:00 in the afternoon.  This lasted for about 1 hour, along with intermittent loss of control of her legs several times since yesterday at 3pm.  This was followed by right sided headache today 30 minutes prior to arrival in ED.   On arrival to the emergency department her blood pressure was 107/55.  Currently she is not having any symptoms or residual symptoms.  She states that this is never occurred before.  She has had headaches in the past and states that she did have a mild headache afterwards.  But she denies any throbbing, pulsating, photophobia, phonophobia.  She states she takes 81 mg of aspirin a day and has not missed any doses.    Date last known well: Date: 12/05/2017 Time last known well: Time: 16:00 tPA Given: No: symptoms resolved.  Modified Rankin: Rankin Score=0   Past Medical History:  Diagnosis Date  . High cholesterol   . Hypertension     Past Surgical History:  Procedure Laterality Date  . ABDOMINAL HYSTERECTOMY    . BUNIONECTOMY Left     Family History  Problem Relation Age of Onset  . Stroke Mother   . Stroke Father    Social History:  reports that  has never smoked. she has never used smokeless tobacco. She reports that she does not drink alcohol or use drugs.  Allergies:  Allergies  Allergen Reactions  . Other     Has had adverse reaction to a migraine medication  . Penicillins Itching    Medications:                                                                                                                           Prior to Admission:  Medications Prior to Admission  Medication Sig Dispense  Refill Last Dose  . amLODipine (NORVASC) 5 MG tablet Take 2.5 mg by mouth daily.    12/05/2017 at Unknown time  . aspirin  81 MG chewable tablet Chew 81 mg by mouth daily.   12/05/2017 at Unknown time  . atorvastatin (LIPITOR) 10 MG tablet Take 10 mg by mouth daily.   12/05/2017 at Unknown time  . famotidine-calcium carbonate-magnesium hydroxide (PEPCID COMPLETE) 10-800-165 MG chewable tablet Chew 1 tablet by mouth daily as needed (heartburn).   Past Month at Unknown time  . losartan-hydrochlorothiazide (HYZAAR) 100-12.5 MG tablet Take 1 tablet by mouth daily.   12/05/2017 at Unknown time  . metoprolol succinate (TOPROL-XL) 25 MG 24 hr tablet Take 25 mg by mouth daily.   12/05/2017 at 8a  . omeprazole (PRILOSEC) 40 MG capsule Take 40 mg by mouth daily.   12/05/2017 at Unknown time  . Probiotic Product (PRO-BIOTIC BLEND PO) Take 1 tablet by mouth daily.   12/05/2017 at Unknown time  . omeprazole (PRILOSEC) 20 MG capsule Take 20 mg by mouth daily.   Not Taking at Unknown time   Scheduled: . aspirin  300 mg Rectal Daily   Or  . aspirin  325 mg Oral Daily  . pantoprazole  40 mg Oral Daily  . rosuvastatin  20 mg Oral q1800   Continuous:   ROS:                                                                                                                                       History obtained from the patient  General ROS: negative for - chills, fatigue, fever, night sweats, weight gain or weight loss Psychological ROS: negative for - , hallucinations, memory difficulties, mood swings or  Ophthalmic ROS: negative for - blurry vision, double vision, eye pain or loss of vision ENT ROS: negative for - epistaxis, nasal discharge, oral lesions, sore throat, tinnitus or vertigo Respiratory ROS: negative for - cough,  shortness of breath or wheezing Cardiovascular ROS: negative for - chest pain, dyspnea on exertion,  Gastrointestinal ROS: negative for - abdominal pain, diarrhea,  nausea/vomiting or stool  incontinence Genito-Urinary ROS: negative for - dysuria, hematuria, incontinence or urinary frequency/urgency Musculoskeletal ROS: negative for - joint swelling or muscular weakness Neurological ROS: as noted in HPI   General Examination:                                                                                                      Blood pressure (!) 103/55, pulse (!) 52, temperature 97.7 F (36.5 C), temperature source Oral, resp. rate 18, height 5\' 7"  (1.702 m), weight 81.9 kg (180 lb 8.9 oz),  SpO2 99 %.  HEENT-  Normocephalic, no lesions, without obvious abnormality.  Normal external eye and conjunctiva.   Cardiovascular- S1-S2 audible, pulses palpable throughout   Lungs-no rhonchi or wheezing noted, no excessive working breathing.  Saturations within normal limits Abdomen- All 4 quadrants palpated and nontender Extremities- Warm, dry and intact Musculoskeletal-no joint tenderness, deformity or swelling Skin-warm and dry, no hyperpigmentation, vitiligo, or suspicious lesions  Neurological Examination Mental Status: Alert, oriented, thought content appropriate.  Speech fluent without evidence of aphasia.  Able to follow 3 step commands without difficulty. Cranial Nerves: II: Discs flat bilaterally; Visual fields grossly normal,  III,IV, VI: ptosis not present, extra-ocular motions intact bilaterally, pupils equal, round, reactive to light and accommodation V,VII: smile symmetric, facial light touch sensation normal bilaterally VIII: hearing normal bilaterally IX,X: uvula rises symmetrically XI: bilateral shoulder shrug XII: midline tongue extension Motor: Right : Upper extremity   5/5    Left:     Upper extremity   5/5  Lower extremity   5/5     Lower extremity   5/5 Tone and bulk:normal tone throughout; no atrophy noted Sensory: Pinprick and light touch intact throughout, bilaterally Deep Tendon Reflexes: 1+ and symmetric throughout Plantars: Right: downgoing   Left:  downgoing Cerebellar: normal finger-to-nose, normal rapid alternating movements and normal heel-to-shin test Gait: normal gait and station   Lab Results: Basic Metabolic Panel: Recent Labs  Lab 12/05/17 1703 12/06/17 0617  NA 138 139  K 4.0 3.6  CL 103 103  CO2 26 25  GLUCOSE 89 95  BUN 21* 13  CREATININE 0.84 0.80  CALCIUM 9.1 9.0    CBC: Recent Labs  Lab 12/05/17 1703 12/06/17 0617  WBC 8.0 6.2  NEUTROABS 5.4  --   HGB 12.9 13.2  HCT 38.3 40.2  MCV 86.7 87.4  PLT 266 254    Lipid Panel: Recent Labs  Lab 12/06/17 0617  CHOL 154  TRIG 93  HDL 49  CHOLHDL 3.1  VLDL 19  LDLCALC 86    CBG: Recent Labs  Lab 12/05/17 1703  GLUCAP 76    Imaging: Ct Head Wo Contrast  Result Date: 12/05/2017  IMPRESSION: Normal unenhanced CT scan of the brain. Electronically Signed   By: Amie Portland M.D.   On: 12/05/2017 17:16   Mr Brain Wo Contrast  Result Date: 12/06/2017 . IMPRESSION: MRI HEAD: 1. Normal noncontrast MRI of the head for age. MRA HEAD: 1. No emergent large vessel occlusion or flow limiting stenosis. 2. 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 Neuro-Interventional Radiology consultation is suggested to evaluate the appropriateness of potential treatment. Non-emergent evaluation can be arranged by calling [phone] during usual hours. Emergency evaluation can be requested by paging [phone]. Electronically Signed   By: Awilda Metro M.D.   On: 12/06/2017 06:26    Assessment and plan discussed with with attending physician and they are in agreement.    Felicie Morn PA-C [facility] [phone]  12/07/2017, 11:39 AM   Assessment: 72 y.o. female   Stroke Risk Factors - hyperlipidemia and hypertension  LDL--86 A1c--61. Vascular ultrasound--Preliminary report:  1-39% ICA plaquing. Vertebral artery flow is antegrade. Echo pending    Recommend PT consult, OT consult, Speech consult Echocardiogram 80 mg  of Atorvistatin  Prophylactic therapy-Antiplatelet med: A discussion was had with Dr. Roda Shutters stroke MD and studies have shown that adding 81 mg aspirin to Plavix for 3 weeks has proven to be beneficial.  At this time that would  be our recommendation and then to have patient follow-up within 2-3 weeks with stroke MD at [facility] neurology.    Risk factor modification  Telemetry monitoring Frequent neuro checks NPO until passes stroke swallow screen  please page stroke NP  Or  PA  Or MD from 8am -4 pm  as this patient from this time will be  followed by the stroke.   You can look them up on [email or web address]  Password [email or web address]   NEUROHOSPITALIST ADDENDUM Discussed the case with the PA-C. Symptoms consistent with TIA. Patient does not have significant ICAD, echo normal. Will due to dual antiplatelets x 3 weeks, statin and have her follow up in clinic.   Georgiana Spinner Ernesteen Mihalic MD [facility] [phone]  If 7pm to 7am, please call on call as listed on AMION.

## 2017-12-22 ENCOUNTER — Other Ambulatory Visit: Payer: Self-pay

## 2017-12-22 ENCOUNTER — Encounter: Payer: Self-pay | Admitting: Neurology

## 2017-12-22 ENCOUNTER — Telehealth (HOSPITAL_COMMUNITY): Payer: Self-pay

## 2017-12-22 ENCOUNTER — Ambulatory Visit (INDEPENDENT_AMBULATORY_CARE_PROVIDER_SITE_OTHER): Payer: Medicare Other | Admitting: Neurology

## 2017-12-22 VITALS — BP 121/78 | HR 70 | Ht 67.0 in | Wt 185.5 lb

## 2017-12-22 DIAGNOSIS — I671 Cerebral aneurysm, nonruptured: Secondary | ICD-10-CM | POA: Diagnosis not present

## 2017-12-22 NOTE — Progress Notes (Signed)
Reason for visit: TIA  Referring physician: [facility] Allison is a 72 y.o. female  History of present illness:  [name] is a 72 year old right-handed white female with a history of a possible TIA that led to admission to the hospital on 05 December 2017. The day prior to admission she had an event where she felt as if both legs were weak, and she collapsed to the floor.  She denied any dizziness or lightheaded sensations or dimming of vision or tunnel vision prior to the fall.  The episode lasted 2-3 minutes, the patient recovered rapidly and was able to get up and walk normally.  The patient had no discomfort, no problems controlling the bowels or the bladder, no speech changes or numbness or tingling sensations with the first event.  The second event occurred on the day of admission.  The patient was sitting at the time, she noted onset of numbness of the right leg, but no numbness on the body or arm or face.  The patient had no visual changes or speech changes with this.  She did develop a right-sided headache.  She went to the emergency room and was admitted for a thorough investigation.  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  The patient had a 2D echocardiogram that was unremarkable.  She currently is wearing a 30-day heart monitor.  The patient does have a history of migraine headaches, she may have one headache every 2 weeks or so.  She has never had episodes of numbness or weakness with the headache.  The patient does report episodes of low blood pressure.  She has often times had episodes of blood pressures in the 85-95 systolic range.  She did not check her blood pressure during the episode of leg weakness.  The patient has a history of palpitations of the heart.  She has no prior history of atrial fibrillation.  The patient has a hemoglobin A1c of 6.1.  She is sent to this office for further  evaluation.  Past Medical History:  Diagnosis Date  . High cholesterol   . Hypertension     Past Surgical History:  Procedure Laterality Date  . ABDOMINAL HYSTERECTOMY    . BUNIONECTOMY Left     Family History  Problem Relation Age of Onset  . Stroke Mother   . Stroke Father     Social history:  reports that  has never smoked. she has never used smokeless tobacco. She reports that she does not drink alcohol or use drugs.  Medications:  Prior to Admission medications   Medication Sig Start Date End Date Taking? Authorizing Provider  amLODipine (NORVASC) 5 MG tablet Take 2.5 mg by mouth daily.    Yes [provider]  aspirin EC 81 MG tablet Take 1 tablet (81 mg total) by mouth daily. 12/07/17 12/07/18 Yes Calvert Cantor, MD  atorvastatin (LIPITOR) 10 MG tablet Take 10 mg by mouth daily. 12/04/17  Yes [provider]  clopidogrel (PLAVIX) 75 MG tablet Take 1 tablet (75 mg total) by mouth daily. 12/07/17 12/07/18 Yes Calvert Cantor, MD  famotidine-calcium carbonate-magnesium hydroxide (PEPCID COMPLETE) 10-800-165 MG chewable tablet Chew 1 tablet by mouth daily as needed (heartburn).   Yes [provider]  losartan-hydrochlorothiazide (HYZAAR) 100-12.5 MG tablet Take 1 tablet by mouth daily.   Yes [provider]  metoprolol tartrate (LOPRESSOR) 25 MG tablet Take 12.5 mg by mouth  daily.   Yes [provider]  omeprazole (PRILOSEC) 40 MG capsule Take 40 mg by mouth daily.   Yes [provider]  Probiotic Product (PRO-BIOTIC BLEND PO) Take 1 tablet by mouth daily.   Yes [provider]      Allergies  Allergen Reactions  . Other     Has had adverse reaction to a migraine medication  . Penicillins Itching    ROS:  Out of a complete 14 system review of symptoms, the patient complains only of the following symptoms, and all other reviewed systems are negative.  Fatigue Headache  Blood pressure 121/78, pulse 70, height 5'  7" (1.702 m), weight 185 lb 8 oz (84.1 kg).   Blood pressure, standing, right arm is 126 systolic.  Physical Exam  General: The patient is alert and cooperative at the time of the examination.  The patient is moderately obese.  Eyes: Pupils are equal, round, and reactive to light. Discs are flat bilaterally.  Neck: The neck is supple, no carotid bruits are noted.  Respiratory: The respiratory examination is clear.  Cardiovascular: The cardiovascular examination reveals a regular rate and rhythm, no obvious murmurs or rubs are noted.  Skin: Extremities are without significant edema.  Neurologic Exam  Mental status: The patient is alert and oriented x 3 at the time of the examination. The patient has apparent normal recent and remote memory, with an apparently normal attention span and concentration ability.  Cranial nerves: Facial symmetry is present. There is good sensation of the face to pinprick and soft touch bilaterally. The strength of the facial muscles and the muscles to head turning and shoulder shrug are normal bilaterally. Speech is well enunciated, no aphasia or dysarthria is noted. Extraocular movements are full. Visual fields are full. The tongue is midline, and the patient has symmetric elevation of the soft palate. No obvious hearing deficits are noted.  Motor: The motor testing reveals 5 over 5 strength of all 4 extremities. Good symmetric motor tone is noted throughout.  Sensory: Sensory testing is intact to pinprick, soft touch, vibration sensation, and position sense on all 4 extremities. No evidence of extinction is noted.  Coordination: Cerebellar testing reveals good finger-nose-finger and heel-to-shin bilaterally.  Gait and station: Gait is normal. Tandem gait is normal. Romberg is negative. No drift is seen.  Reflexes: Deep tendon reflexes are symmetric and normal bilaterally. Toes are downgoing bilaterally.   MRI brain and MRA head  12/06/17:  FINDINGS: MRI HEAD FINDINGS  INTRACRANIAL CONTENTS: No reduced diffusion to suggest acute ischemia. No susceptibility artifact to suggest hemorrhage. The ventricles and sulci are normal for patient's age. No suspicious parenchymal signal, masses, mass effect. No abnormal extra-axial fluid collections. No extra-axial masses.  VASCULAR: Normal major intracranial vascular flow voids present at skull base.  SKULL AND UPPER CERVICAL SPINE: No abnormal sellar expansion. No suspicious calvarial bone marrow signal. Craniocervical junction maintained.  SINUSES/ORBITS: The mastoid air-cells and included paranasal sinuses are well-aerated.The included ocular globes and orbital contents are non-suspicious.  OTHER: None.  MRA HEAD FINDINGS  ANTERIOR CIRCULATION: Normal flow related enhancement of the included cervical, petrous, cavernous and supraclinoid internal carotid arteries. 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 LEFT ICA is developmentally larger. 2 mm superiorly directed aneurysm LEFT A1-2 junction. Patent anterior and middle cerebral arteries, including distal segments.  No large vessel occlusion, flow limiting stenosis, aneurysm.  POSTERIOR CIRCULATION: LEFT vertebral artery is dominant. Basilar artery is patent, with normal flow related enhancement  of the main branch vessels. Patent posterior cerebral arteries. Fetal origin RIGHT posterior cerebral artery. Tiny LEFT posterior communicating artery present.  No large vessel occlusion, flow limiting stenosis,  aneurysm.  ANATOMIC VARIANTS: Aplastic RIGHT A1 segment.  Source images and MIP images were reviewed.  IMPRESSION: MRI HEAD:  1. Normal noncontrast MRI of the head for age.  MRA HEAD:  1. No emergent large vessel occlusion or flow limiting stenosis. 2. 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  * MRI scan images were reviewed online.  I agree with the written report.   Carotid doppler 12/07/17:  Carotid duplex completed.    Preliminary report:  1-39% ICA plaquing. Vertebral artery flow is antegrade.   2D echo 12/06/17:  Study Conclusions  - Left ventricle: The cavity size was normal. Wall thickness was   increased in a pattern of mild LVH. Systolic function was normal.   The estimated ejection fraction was in the range of 60% to 65%. - Mitral valve: There was mild regurgitation. - Left atrium: The atrium was mildly dilated. - Pulmonary arteries: PA peak pressure: 38 mm Hg (S).   Assessment/Plan:  1.  Possible TIA event  2.  Episodic hypotension  3.  History of migraine headache  4.  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  I am not clear that this truly represented a TIA event.  The patient has a cardiac monitor in place which is important to exclude a significant heart rhythm issue that could have led to the medical event.  The patient will remain on aspirin and Plavix combination for 90 days, then the aspirin will be discontinued.  The patient will be sent for a consultation with Dr. Corliss Skains, but the cerebral aneurysms picked up are quite small and may not require intervention currently.  The patient will follow-up through this office in 6 months.  Marlan Palau MD 12/22/2017 7:30 AM  [facility] [address] [place], [place] [address]  Phone [phone] Fax [phone]

## 2017-12-22 NOTE — Telephone Encounter (Signed)
Called to schedule consult, left message for pt to return call. AW 

## 2017-12-31 DIAGNOSIS — I671 Cerebral aneurysm, nonruptured: Secondary | ICD-10-CM | POA: Insufficient documentation

## 2018-01-05 ENCOUNTER — Ambulatory Visit (HOSPITAL_COMMUNITY)
Admission: RE | Admit: 2018-01-05 | Discharge: 2018-01-05 | Disposition: A | Discharge status: Home / Self Care | Payer: Medicare Other | Source: Ambulatory Visit | Attending: Neurology | Admitting: Neurology

## 2018-01-05 DIAGNOSIS — I671 Cerebral aneurysm, nonruptured: Secondary | ICD-10-CM

## 2018-01-05 HISTORY — PX: IR RADIOLOGIST EVAL & MGMT: IMG5224

## 2018-01-06 ENCOUNTER — Encounter (HOSPITAL_COMMUNITY): Payer: Self-pay | Admitting: Interventional Radiology

## 2018-01-27 ENCOUNTER — Telehealth (HOSPITAL_COMMUNITY): Payer: Self-pay | Admitting: Radiology

## 2018-01-27 DIAGNOSIS — R519 Headache, unspecified: Secondary | ICD-10-CM | POA: Insufficient documentation

## 2018-01-27 DIAGNOSIS — R51 Headache: Secondary | ICD-10-CM

## 2018-01-27 NOTE — Telephone Encounter (Signed)
Called pt to follow-up from her consult on her decision as to how she would like to proceed with following her aneurysm. Pt states that she would like to continue with MRI/MRA in 6 months time from her last MRI which was in Feb. 2019. She will be due in August 2019. We will contact her at that time to set this up. JM

## 2018-02-09 LAB — BASIC METABOLIC PANEL
BUN: 21 (ref 4–21)
CREATININE: 0.8 (ref <=1.1)
Glucose: 128
POTASSIUM: 3.8 (ref 3.4–5.3)
Sodium: 137 (ref 137–147)

## 2018-02-09 LAB — HEPATIC FUNCTION PANEL
ALT: 15 (ref 7–35)
AST: 17 (ref 13–35)
Alkaline Phosphatase: 88 (ref 25–125)
Bilirubin, Total: 0.4

## 2018-02-09 LAB — CBC AND DIFFERENTIAL
HCT: 40 (ref 36–46)
Hemoglobin: 13.5 (ref 12.0–16.0)
PLATELETS: 281 (ref 150–399)
WBC: 7.2

## 2018-02-09 LAB — TSH: TSH: 3.18 (ref <=5.90)

## 2018-02-09 LAB — VITAMIN B12
Vitamin B-12: 127
Vitamin B-12: 127

## 2018-02-10 DIAGNOSIS — E538 Deficiency of other specified B group vitamins: Secondary | ICD-10-CM | POA: Insufficient documentation

## 2018-02-17 NOTE — Progress Notes (Signed)
Allison Chris MD Reason for referral-hypertension  HPI: 72 year old female for evaluation of hypertension at request of Merilynn Finland, MD.  Patient previously followed at [facility] by Dorise Hiss, MD. Echocardiogram February 2019 showed normal LV function, mild mitral regurgitation and mild left atrial enlargement.  MRA February 2019 showed no significant stenosis.  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  Seen by interventional radiology and options included arteriogram versus watchful waiting.  Event monitor March 2019 showed no arrhythmia.  Patient has had hypertension for approximately 12 years.  It has been labile in the past.  In February she had an episode where she felt her legs become weak.  She then developed numbness in her right lower extremity.  She was ultimately diagnosed with a TIA.  She is now on Hyzaar and metoprolol and her systolic blood pressure is 95-100 by her report.  She describes some fatigue but she denies dyspnea on exertion, orthopnea, PND or pedal edema.  She denies exertional chest pain or syncope.  She now presents for further evaluation of her hypertension.  Current Outpatient Medications  Medication Sig Dispense Refill  . Ascorbic Acid (VITAMIN C) 1000 MG tablet Take 1,000 mg by mouth daily.    Marland Kitchen aspirin EC 81 MG tablet Take 1 tablet (81 mg total) by mouth daily. 150 tablet 2  . atorvastatin (LIPITOR) 10 MG tablet Take 10 mg by mouth daily.    . Calcium Carb-Cholecalciferol (CALCIUM-VITAMIN D3) 250-125 MG-UNIT TABS Take 1 tablet by mouth daily.    . clopidogrel (PLAVIX) 75 MG tablet Take 1 tablet (75 mg total) by mouth daily. 30 tablet 11  . Cyanocobalamin (B-12 COMPLIANCE INJECTION) 1000 MCG/ML KIT Inject 1,000 mg as directed once a week.    . losartan-hydrochlorothiazide (HYZAAR) 100-12.5 MG tablet Take 1 tablet by mouth daily.    . metoprolol tartrate (LOPRESSOR) 25 MG tablet Take 12.5 mg by mouth daily.    Marland Kitchen omeprazole  (PRILOSEC) 40 MG capsule Take 40 mg by mouth daily.    . Probiotic Product (PRO-BIOTIC BLEND PO) Take 1 tablet by mouth daily.     No current facility-administered medications for this visit.     Allergies  Allergen Reactions  . Other     Has had adverse reaction to a migraine medication  . Penicillins Itching  . Simvastatin Other (See Comments)     Past Medical History:  Diagnosis Date  . GERD (gastroesophageal reflux disease)   . Hyperlipidemia 12/05/2017  . Hypertension   . Hypothyroid   . TIA (transient ischemic attack) 12/05/2017    Past Surgical History:  Procedure Laterality Date  . ABDOMINAL HYSTERECTOMY    . APPENDECTOMY    . BUNIONECTOMY Left   . IR RADIOLOGIST EVAL & MGMT  01/05/2018    Social History   Socioeconomic History  . Marital status: Married    Spouse name: Not on file  . Number of children: 0  . Years of education: 18-20  . Highest education level: Not on file  Occupational History  . Occupation: Retired  Scientific laboratory technician  . Financial resource strain: Not on file  . Food insecurity:    Worry: Not on file    Inability: Not on file  . Transportation needs:    Medical: Not on file    Non-medical: Not on file  Tobacco Use  . Smoking status: Never Smoker  . Smokeless tobacco: Never Used  Substance and Sexual Activity  . Alcohol  use: No    Frequency: Never  . Drug use: No  . Sexual activity: Not on file  Lifestyle  . Physical activity:    Days per week: Not on file    Minutes per session: Not on file  . Stress: Not on file  Relationships  . Social connections:    Talks on phone: Not on file    Gets together: Not on file    Attends religious service: Not on file    Active member of club or organization: Not on file    Attends meetings of clubs or organizations: Not on file    Relationship status: Not on file  . Intimate partner violence:    Fear of current or ex partner: Not on file    Emotionally abused: Not on file    Physically  abused: Not on file    Forced sexual activity: Not on file  Other Topics Concern  . Not on file  Social History Narrative   Lives with husband   Caffeine use: Coffee daily   Right handed    Family History  Problem Relation Age of Onset  . Stroke Mother   . Stroke Father     ROS: Fatigue and occasional headache but no fevers or chills, productive cough, hemoptysis, dysphasia, odynophagia, melena, hematochezia, dysuria, hematuria, rash, seizure activity, orthopnea, PND, pedal edema, claudication. Remaining systems are negative.  Physical Exam:   Blood pressure 106/76, pulse (!) 56, height '5\' 7"'$  (1.702 m), weight 184 lb (83.5 kg).  General:  Well developed/well nourished in NAD Skin warm/dry Patient not depressed No peripheral clubbing Back-normal HEENT-normal/normal eyelids Neck supple/normal carotid upstroke bilaterally; no bruits; no JVD; no thyromegaly chest - CTA/ normal expansion CV - RRR/normal S1 and S2; no murmurs, rubs or gallops;  PMI nondisplaced Abdomen -NT/ND, no HSM, no mass, + bowel sounds, no bruit 2+ femoral pulses, no bruits Ext-no edema, chords, 2+ DP Neuro-grossly nonfocal  ECG -December 05, 2017-sinus bradycardia with PACs.  Personally reviewed  A/P   1 hypertension-blood pressure is soft today.  We discussed options and I told her that this may be contributing to her fatigue.  However she has had labile blood pressure in the past and at present she states it has been well controlled and would prefer to continue present regimen.  We can adjust based on follow-up readings.  Note LV function is normal.  2 hyperlipidemia-continue statin.  3 cerebral aneurysm-management per neurology and interventional radiology.  Continue aspirin and Plavix.  Kirk Ruths, MD

## 2018-03-01 ENCOUNTER — Ambulatory Visit (INDEPENDENT_AMBULATORY_CARE_PROVIDER_SITE_OTHER): Payer: Medicare Other | Admitting: Cardiology

## 2018-03-01 ENCOUNTER — Encounter: Payer: Self-pay | Admitting: Cardiology

## 2018-03-01 VITALS — BP 106/76 | HR 56 | Ht 67.0 in | Wt 184.0 lb

## 2018-03-01 DIAGNOSIS — G459 Transient cerebral ischemic attack, unspecified: Secondary | ICD-10-CM

## 2018-03-01 DIAGNOSIS — I10 Essential (primary) hypertension: Secondary | ICD-10-CM

## 2018-03-01 DIAGNOSIS — E7800 Pure hypercholesterolemia, unspecified: Secondary | ICD-10-CM | POA: Diagnosis not present

## 2018-03-01 NOTE — Patient Instructions (Signed)
Medication Instructions:  Your physician recommends that you continue on your current medications as directed. Please refer to the Current Medication list given to you today.  Labwork: None   Testing/Procedures: none  Follow-Up: Your physician wants you to follow-up in: 6 months with Dr Jens Som in [facility]. You will receive a reminder letter in the mail two months in advance. If you don't receive a letter, please call our office to schedule the follow-up appointment.  Any Other Special Instructions Will Be Listed Below (If Applicable).  If you need a refill on your cardiac medications before your next appointment, please call your pharmacy.

## 2018-04-01 ENCOUNTER — Other Ambulatory Visit: Payer: Self-pay

## 2018-04-01 ENCOUNTER — Encounter: Payer: Self-pay | Admitting: Family Medicine

## 2018-04-01 ENCOUNTER — Ambulatory Visit (INDEPENDENT_AMBULATORY_CARE_PROVIDER_SITE_OTHER): Payer: Medicare Other | Admitting: Family Medicine

## 2018-04-01 ENCOUNTER — Telehealth: Payer: Self-pay | Admitting: Neurology

## 2018-04-01 VITALS — BP 120/86 | HR 76 | Temp 98.0°F | Ht 67.0 in | Wt 187.8 lb

## 2018-04-01 DIAGNOSIS — I671 Cerebral aneurysm, nonruptured: Secondary | ICD-10-CM

## 2018-04-01 DIAGNOSIS — E782 Mixed hyperlipidemia: Secondary | ICD-10-CM | POA: Diagnosis not present

## 2018-04-01 DIAGNOSIS — G459 Transient cerebral ischemic attack, unspecified: Secondary | ICD-10-CM | POA: Diagnosis not present

## 2018-04-01 DIAGNOSIS — Z8669 Personal history of other diseases of the nervous system and sense organs: Secondary | ICD-10-CM | POA: Diagnosis not present

## 2018-04-01 DIAGNOSIS — R5383 Other fatigue: Secondary | ICD-10-CM

## 2018-04-01 DIAGNOSIS — I10 Essential (primary) hypertension: Secondary | ICD-10-CM

## 2018-04-01 DIAGNOSIS — R7301 Impaired fasting glucose: Secondary | ICD-10-CM

## 2018-04-01 MED ORDER — LOSARTAN POTASSIUM-HCTZ 50-12.5 MG PO TABS: 1.0000 | ORAL_TABLET | Freq: Every day | ORAL | 3 refills | Status: DC

## 2018-04-01 MED ORDER — ZOSTER VAC RECOMB ADJUVANTED 50 MCG/0.5ML IM SUSR: 0.5000 mL | Freq: Once | INTRAMUSCULAR | 0 refills | Status: AC

## 2018-04-01 NOTE — Patient Instructions (Signed)
Please return in 4 weeks for your annual complete physical; please come fasting. And f/u on fatigue and blood pressure.   Please increase your lipitor to 20mg  nightly.  Please take the lower dose of Hyzaar that I have ordered for you and continue taking your daily blood pressures. You can also check in the afternoon or evenings periodically.   It was a pleasure meeting you today! Thank you for choosing us to meet your healthcare needs! I truly look forward to working with you. If you have any questions or concerns, please send me a message via Mychart or call the office at [phone].

## 2018-04-01 NOTE — Telephone Encounter (Signed)
I called the patient, talk with the husband.  The patient will stop the Plavix and stay on aspirin.  I will see the patient back in September.

## 2018-04-01 NOTE — Telephone Encounter (Signed)
Last OV you had mentioned he could stop the aspirin, but patient would prefer to stop plavix. Please advise.

## 2018-04-01 NOTE — Addendum Note (Signed)
Addended by: York SpanielWILLIS, Keyonta Barradas K on: 04/01/2018 05:10 PM   Modules accepted: Orders

## 2018-04-01 NOTE — Telephone Encounter (Signed)
Pt requesting a call back to discuss coming off of Plavix or Aspirin. Pt states she would prefer to come off of Plavix. Please call to advise

## 2018-04-01 NOTE — Progress Notes (Signed)
Subjective  CC:  Chief Complaint  Patient presents with  . Establish Care    moved here from [place]. has been seeing clinic at [facility]   . Headache  . Neck Pain    has hx of brain aneursym and TIA     HPI: Allison Richardson is a 72 y.o. female who presents to [place] at [facility] today to establish care with me as a new patient.   She has the following concerns or needs:  Very pleasant 72 year old female who is been living in [facility] for the last 2 years.  I reviewed extensive notes from her care everywhere medical records.  In February she had an incident that was possibly related to a TIA.  Since, she is been seen by cardiology and neurology.  I reviewed their notes.  She had an echocardiogram that showed normal left ventricular function.  A 30-day Holter monitor was negative for arrhythmia.  MRI and MRA of the brain did show a small cerebral aneurysm that has been evaluated by interventional radiology.  Neurology is following and she is on aspirin and Plavix currently.  She is had no more weakness or numbness symptoms.  However, at the end of May she had a 2-week illness that caused her to be extremely fatigued, she reports swelling in the upper back and pain in the left anterior chest.  She was evaluated by the clinic MD where she lives.  I can see that blood work was ordered by do not have the results.  They did do a screening test for Lyme's disease that was positive but the follow-up testing was all negative.  She was treated with doxycycline for 3 weeks.  She reports that she is feeling much better but remains tired.  She has no exertional symptoms.  No symptoms of heart failure.  Hypertension: She brings in daily blood pressure log for the last 3 months.  I reviewed notes from her old records.  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  Blood pressures run on  the low side.  She has systolics as low as 80, multiple systolic readings in the 73X.  She does seem symptomatic with weakness and fatigue during these times.  No elevated readings.  Highest systolic is 106.  She denies palpitations.  At times will feel lightheaded.  No syncopal events.  History of migraines that are now controlled with acupuncture.  Hyperlipidemia month with most recent LDL above 80.  Tolerate statin without myalgias  Assessment  1. Essential hypertension   2. Mixed hyperlipidemia   3. Impaired fasting glucose   4. TIA (transient ischemic attack)   5. Anterior communicating artery aneurysm   6. Fatigue, unspecified type   7. History of migraine headaches      Plan   Fatigue and hypotension: I believe these are related.  Counseled on need to increase blood pressure.  Adjust blood pressure medications, she will continue daily readings and we will follow-up in 3 to 4 weeks.  I suspect she will feel much better with blood pressures 120-140/70-90.  If she has some lability, will allow her systolics to run a little higher.  She understands and agrees with care plan.  She is going to get me her most recent lab results that include renal function, electrolytes, CBC etc.  Possible TIA: She has follow-up with Dr. Jannifer Franklin.  She will  call his office to clarify whether she should still be on the aspirin therapy.  His notes recommend stopping after 3 months.  She is on Plavix without evidence of bleeding.  No further TIA symptoms.  Headaches are controlled  Small anterior communicating artery aneurysm, 2 mm: She will have serial imaging studies to monitor this.  She hopes to avoid intervention.  Health maintenance: Overdue for physical exam, screening studies including mammography and bone density, annual wellness visit.  Gave prescription for Shingrix today.  Follow up:  Return in about 1 month (around 04/29/2018) for complete physical, follow up Hypertension. Orders Placed This  Encounter  Procedures  . HM COLONOSCOPY   Meds ordered this encounter  Medications  . losartan-hydrochlorothiazide (HYZAAR) 50-12.5 MG tablet    Sig: Take 1 tablet by mouth daily.    Dispense:  90 tablet    Refill:  3  . Zoster Vaccine Adjuvanted [facility]) injection    Sig: Inject 0.5 mLs into the muscle once for 1 dose. Please give 2nd dose 2-6 months after first dose    Dispense:  2 each    Refill:  0      We updated and reviewed the patient's past history in detail and it is documented below.  Patient Active Problem List   Diagnosis Date Noted  . History of migraine headaches 04/01/2018    Now controlled by accupuncture.   . Vitamin B 12 deficiency 02/10/2018  . Nonintractable episodic headache 01/27/2018  . 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 TIA (transient ischemic attack) 12/05/2017  . Essential hypertension 12/05/2017  . GERD (gastroesophageal reflux disease) 11/16/2017  . IBS (irritable bowel syndrome) 11/16/2017  . Hyperlipidemia 10/15/2017  . Paroxysmal SVT (supraventricular tachycardia) ([place]) 07/07/2016  . Impaired fasting glucose 06/03/2016   Health Maintenance  Topic Date Due  . Hepatitis C Screening  1946-06-06  . MAMMOGRAM  12/20/1963  . DEXA SCAN  12/20/2010  . INFLUENZA VACCINE  05/13/2018  . COLONOSCOPY  02/11/2024  . PNA vac Low Risk Adult  Completed  . TETANUS/TDAP  Discontinued   Immunization History  Administered Date(s) Administered  . Influenza,inj,Quad PF,6+ Mos 07/14/2017  . Influenza-Unspecified 08/16/2015  . Pneumococcal Conjugate-13 08/16/2015  . Pneumococcal Polysaccharide-23 10/24/2013  . Td 12/09/2007  . Zoster 08/13/2008   Current Meds  Medication Sig  . Ascorbic Acid (VITAMIN C) 1000 MG tablet Take 1,000 mg by mouth daily.  Marland Kitchen aspirin EC 81 MG tablet Take 1 tablet (81 mg total) by mouth daily.  Marland Kitchen atorvastatin (LIPITOR) 10 MG tablet Take 20 mg by mouth at bedtime.  . Calcium Carb-Cholecalciferol  (CALCIUM-VITAMIN D3) 250-125 MG-UNIT TABS Take 1 tablet by mouth daily.  . clopidogrel (PLAVIX) 75 MG tablet Take 1 tablet (75 mg total) by mouth daily.  . Cyanocobalamin (B-12 COMPLIANCE INJECTION) 1000 MCG/ML KIT Inject 1,000 mg as directed once a week.  . metoprolol tartrate (LOPRESSOR) 25 MG tablet Take 12.5 mg by mouth daily.  . Probiotic Product (PRO-BIOTIC BLEND PO) Take 1 tablet by mouth daily.  . Wheat Dextrin (BENEFIBER DRINK MIX PO) Take by mouth.  . [DISCONTINUED] losartan-hydrochlorothiazide (HYZAAR) 100-12.5 MG tablet Take 1 tablet by mouth daily.  . [DISCONTINUED] omeprazole (PRILOSEC) 40 MG capsule Take 40 mg by mouth daily.    Allergies: Patient is allergic to other; penicillins; and simvastatin. Past Medical History Patient  has a past medical history of GERD (gastroesophageal reflux disease), Hyperlipidemia (12/05/2017), Hypertension, Hypothyroid, and TIA (transient ischemic attack) (12/05/2017). Past Surgical History Patient  has a past surgical history that includes Abdominal hysterectomy; Bunionectomy (Left); IR Radiologist Eval & Mgmt (01/05/2018); and Appendectomy. Family History: Patient family history includes Cancer in her father; Early death in her paternal grandmother; Heart attack in her maternal grandfather, paternal grandfather, and paternal grandmother; Heart disease in her maternal grandfather, paternal grandfather, and paternal grandmother; High Cholesterol in her father and mother; High blood pressure in her father and mother; Stroke in her father, maternal grandfather, and mother. Social History:  Patient  reports that she has never smoked. She has never used smokeless tobacco. She reports that she does not drink alcohol or use drugs. 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  No children.  Lives with her husband. Review of Systems: Constitutional: negative for fever or malaise Ophthalmic: negative for photophobia, double vision or loss of  vision Cardiovascular: negative for chest pain, dyspnea on exertion, or new LE swelling Respiratory: negative for SOB or persistent cough Gastrointestinal: negative for abdominal pain, change in bowel habits or melena Genitourinary: negative for dysuria or gross hematuria Musculoskeletal: negative for new gait disturbance or muscular weakness Integumentary: negative for new or persistent rashes Neurological: negative for TIA or stroke symptoms Psychiatric: negative for SI or delusions Allergic/Immunologic: negative for hives  Patient Care Team    Relationship Specialty Notifications Start End  Leamon Arnt, MD PCP - General Family Medicine  04/01/18   Kathrynn Ducking, MD Consulting Physician Neurology  04/01/18   Lelon Perla, MD Consulting Physician Cardiology  04/01/18     Objective  Vitals: BP 120/86   Pulse 76   Temp 98 F (36.7 C)   Ht _0  (1.702 m)   Wt 187 lb 12.8 oz (85.2 kg)   BMI 29.41 kg/m  General:  Well developed, well nourished, no acute distress  Psych:  Alert and oriented,normal mood and affect HEENT:  Normocephalic, atraumatic, non-icteric sclera, PERRL, oropharynx is without mass or exudate, supple neck without adenopathy, mass or thyromegaly, dowagers hump present Cardiovascular:  RRR without gallop, rub or murmur, nondisplaced PMI, no chest wall tenderness, +2 peripheral pulses Respiratory:  Good breath sounds bilaterally, CTAB with normal respiratory effort Gastrointestinal: normal bowel sounds, soft, non-tender, no noted masses. No HSM MSK: no deformities, contusions. Joints are without erythema or swelling Skin:  Warm, no rashes or suspicious lesions noted Neurologic:    Mental status is normal. Gross motor and sensory exams are normal. Normal gait   Commons side effects, risks, benefits, and alternatives for medications and treatment plan prescribed today were discussed, and the patient expressed understanding of the given instructions. Patient  is instructed to call or message via MyChart if he/she has any questions or concerns regarding our treatment plan. No barriers to understanding were identified. We discussed Red Flag symptoms and signs in detail. Patient expressed understanding regarding what to do in case of urgent or emergency type symptoms.   Medication list was reconciled, printed and provided to the patient in AVS. Patient instructions and summary information was reviewed with the patient as documented in the AVS. This note was prepared with assistance of Dragon voice recognition software. Occasional wrong-word or sound-a-like substitutions may have occurred due to the inherent limitations of voice recognition software

## 2018-04-26 ENCOUNTER — Other Ambulatory Visit: Payer: Self-pay

## 2018-04-26 ENCOUNTER — Ambulatory Visit (INDEPENDENT_AMBULATORY_CARE_PROVIDER_SITE_OTHER): Payer: Medicare Other | Admitting: Family Medicine

## 2018-04-26 ENCOUNTER — Encounter: Payer: Self-pay | Admitting: Family Medicine

## 2018-04-26 VITALS — BP 122/76 | HR 63 | Temp 98.1°F | Ht 67.0 in | Wt 186.8 lb

## 2018-04-26 DIAGNOSIS — R7301 Impaired fasting glucose: Secondary | ICD-10-CM | POA: Diagnosis not present

## 2018-04-26 DIAGNOSIS — Z1231 Encounter for screening mammogram for malignant neoplasm of breast: Secondary | ICD-10-CM | POA: Diagnosis not present

## 2018-04-26 DIAGNOSIS — I10 Essential (primary) hypertension: Secondary | ICD-10-CM

## 2018-04-26 DIAGNOSIS — E782 Mixed hyperlipidemia: Secondary | ICD-10-CM

## 2018-04-26 DIAGNOSIS — Z1239 Encounter for other screening for malignant neoplasm of breast: Secondary | ICD-10-CM

## 2018-04-26 DIAGNOSIS — I671 Cerebral aneurysm, nonruptured: Secondary | ICD-10-CM

## 2018-04-26 DIAGNOSIS — E2839 Other primary ovarian failure: Secondary | ICD-10-CM | POA: Diagnosis not present

## 2018-04-26 DIAGNOSIS — G459 Transient cerebral ischemic attack, unspecified: Secondary | ICD-10-CM | POA: Diagnosis not present

## 2018-04-26 LAB — POCT GLYCOSYLATED HEMOGLOBIN (HGB A1C): Hemoglobin A1C: 5.9 % — AB (ref 4.0–5.6)

## 2018-04-26 MED ORDER — ZOSTER VAC RECOMB ADJUVANTED 50 MCG/0.5ML IM SUSR: 0.5000 mL | Freq: Once | INTRAMUSCULAR | 0 refills | Status: AC

## 2018-04-26 MED ORDER — ATORVASTATIN CALCIUM 20 MG PO TABS: 20.0000 mg | ORAL_TABLET | Freq: Every day | ORAL | 3 refills | Status: DC

## 2018-04-26 NOTE — Patient Instructions (Addendum)
Please return in 4-6 weeks to recheck your blood pressure. Bring your log. Please send me a mychart message clarifying your metoprolol medication name and dose.  Please schedule AWV at patient's convenience with Maudie Mercury.   We will call you with information regarding your referral appointment. [facility] mammogram and bone density screen. If you do not hear from [place] within the next 2 weeks, please let me know. It can take 1-2 weeks to get appointments set up with the specialists.  Please consider getting your eyes examined as well.  You may go get the Shingrix vaccination at a pharmacy.   Medicare recommends an Annual Wellness Visit for all patients. Please schedule this to be done with our Nurse Educator, Maudie Mercury. This is an informative "talk" visit; it's goals are to ensure that your health care needs are being met and to give you education regarding avoiding falls, ensuring you are not suffering from depression or problems with memory or thinking, and to educate you on Advance Care Planning. It helps me take good care of you!  If you have any questions or concerns, please don't hesitate to send me a message via MyChart or call the office at [phone]. Thank you for visiting with [place] today! It's our pleasure caring for you.  Please do these things to maintain good health!   Exercise at least 30-45 minutes a day,  4-5 days a week.   Eat a low-fat diet with lots of fruits and vegetables, up to 7-9 servings per day.  Drink plenty of water daily. Try to drink 8 8oz glasses per day.  Seatbelts can save your life. Always wear your seatbelt.  Place Smoke Detectors on every level of your home and check batteries every year.  Schedule an appointment with an eye doctor for an eye exam every 1-2 years  Safe sex - use condoms to protect yourself from STDs if you could be exposed to these types of infections. Use birth control if you do not want to become pregnant and are sexually active.  Avoid heavy alcohol  use. If you drink, keep it to less than 2 drinks/day and not every day.  [place].  Choose someone you trust that could speak for you if you became unable to speak for yourself.  Depression is common in our stressful world.If you're feeling down or losing interest in things you normally enjoy, please come in for a visit.  If anyone is threatening or hurting you, please get help. Physical or Emotional Violence is never OK.

## 2018-04-26 NOTE — Progress Notes (Signed)
Subjective  Chief Complaint  Patient presents with  . Annual Exam    doing well, no complaints. needs mammogram and Bone Density     HPI: Allison Richardson is a 72 y.o. female who presents to [facility] today for a Female Wellness Visit. She also has the concerns and/or needs as listed above in the chief complaint. These will be addressed in addition to the Health Maintenance Visit.   Wellness Visit: annual visit with health maintenance review and exam without Pap   HM: due mammo and bone density screen. crc screen is up to date and nl. imms due: shingrix only. Due eye exam  Living with husband of almost 50 years at assisted living facility in [place]. Independent and happy Chronic disease f/u and/or acute problem visit: (deemed necessary to be done in addition to the wellness visit):  HTN: we decreased hyzaar dosing last visit and bp's are improved but still with several lows. She checks daily. She remains symptomatic with lows. On metoprolol but she says she is taking 25mg  daily, no 12.5; not sure if it lopressor or toprol xl. Has h/o tachy. Has seen cards as well.   IFG: diet is fair. No sxs of hyperglycemia.   Vit B12 def on meds per clinic. Reviewed labs.   Cerebral aneurysm: off plavix now and on asa per neruo. Feeling fine. No neuro sxs  Possible lymes; continues to improve although remains tired at the end of the day.   Assessment  1. Essential hypertension   2. TIA (transient ischemic attack)   3. Anterior communicating artery aneurysm   4. Mixed hyperlipidemia   5. Impaired fasting glucose   6. Estrogen deficiency   7. Breast cancer screening      Plan  Female Wellness Visit:  Age appropriate Health Maintenance and Prevention measures were discussed with patient. Included topics are cancer screening recommendations, ways to keep healthy (see AVS) including dietary and exercise recommendations, regular eye and dental care, use of seat  belts, and avoidance of moderate alcohol use and tobacco use. Mammo and dexa ordered. Rec eye exam.   BMI: discussed patient's BMI and encouraged positive lifestyle modifications to help get to or maintain a target BMI.  HM needs and immunizations were addressed and ordered. See below for orders. See HM and immunization section for updates. Shingrix discussed and Rx given  Routine labs and screening tests ordered including cmp, cbc and lipids were reviewed.  Discussed recommendations regarding Vit D and calcium supplementation (see AVS)  Chronic disease management visit and/or acute problem visit:  HTN: hold hyzaar and monitor bps. Recheck 4-6 weeks. Clarify which metoprolol she is taking and dose. Nl renal function and lytes by labs in April   HLD with ldl at goal refilled lipitor  IFG: improved.   Monitor for neuro sxs.  Follow up: Return in about 6 weeks (around 06/07/2018) for follow up Hypertension.  Orders Placed This Encounter  Procedures  . DG Bone Density  . MM Digital Screening  . POCT glycosylated hemoglobin (Hb A1C)   Meds ordered this encounter  Medications  . atorvastatin (LIPITOR) 20 MG tablet    Sig: Take 1 tablet (20 mg total) by mouth at bedtime.    Dispense:  90 tablet    Refill:  3  . Zoster Vaccine Adjuvanted [facility]) injection    Sig: Inject 0.5 mLs into the muscle once for 1 dose. Please give 2nd dose 2-6 months after first dose  Dispense:  2 each    Refill:  0      Lifestyle: Body mass index is 29.26 kg/m. Wt Readings from Last 3 Encounters:  04/26/18 186 lb 12.8 oz (84.7 kg)  04/01/18 187 lb 12.8 oz (85.2 kg)  03/01/18 184 lb (83.5 kg)   Diet: general Exercise: intermittently,   Patient Active Problem List   Diagnosis Date Noted  . History of migraine headaches 04/01/2018    Now controlled by accupuncture.   . Vitamin B 12 deficiency 02/10/2018  . Nonintractable episodic headache 01/27/2018  . Anterior communicating artery aneurysm  12/31/2017  . TIA (transient ischemic attack) 12/05/2017  . Essential hypertension 12/05/2017  . GERD (gastroesophageal reflux disease) 11/16/2017  . IBS (irritable bowel syndrome) 11/16/2017  . Hyperlipidemia 10/15/2017  . Paroxysmal SVT (supraventricular tachycardia) ([facility]) 07/07/2016  . Impaired fasting glucose 06/03/2016   Health Maintenance  Topic Date Due  . Hepatitis C Screening  18-Apr-1946  . MAMMOGRAM  12/20/1963  . DEXA SCAN  12/20/2010  . INFLUENZA VACCINE  05/13/2018  . COLONOSCOPY  02/11/2024  . PNA vac Low Risk Adult  Completed  . TETANUS/TDAP  Discontinued   Immunization History  Administered Date(s) Administered  . Influenza,inj,Quad PF,6+ Mos 07/14/2017  . Influenza-Unspecified 08/16/2015  . Pneumococcal Conjugate-13 08/16/2015  . Pneumococcal Polysaccharide-23 10/24/2013  . Td 12/09/2007  . Zoster 08/13/2008   We updated and reviewed the patient's past history in detail and it is documented below. Allergies: Patient is allergic to other; penicillins; and simvastatin. Past Medical History Patient  has a past medical history of GERD (gastroesophageal reflux disease), Hyperlipidemia (12/05/2017), Hypertension, Hypothyroid, and TIA (transient ischemic attack) (12/05/2017). Past Surgical History Patient  has a past surgical history that includes Abdominal hysterectomy; Bunionectomy (Left); IR Radiologist Eval & Mgmt (01/05/2018); and Appendectomy. Family History: Patient family history includes Cancer in her father; Early death in her paternal grandmother; Heart attack in her maternal grandfather, paternal grandfather, and paternal grandmother; Heart disease in her maternal grandfather, paternal grandfather, and paternal grandmother; High Cholesterol in her father and mother; High blood pressure in her father and mother; Stroke in her father, maternal grandfather, and mother. Social History:  Patient  reports that she has never smoked. She has never used smokeless  tobacco. She reports that she does not drink alcohol or use drugs.  Review of Systems: Constitutional: negative for fever or malaise Ophthalmic: negative for photophobia, double vision or loss of vision Cardiovascular: negative for chest pain, dyspnea on exertion, or new LE swelling Respiratory: negative for SOB or persistent cough Gastrointestinal: negative for abdominal pain, change in bowel habits or melena Genitourinary: negative for dysuria or gross hematuria, no abnormal uterine bleeding or disharge Musculoskeletal: negative for new gait disturbance or muscular weakness Integumentary: negative for new or persistent rashes, no breast lumps Neurological: negative for TIA or stroke symptoms Psychiatric: negative for SI or delusions Allergic/Immunologic: negative for hives  Patient Care Team    Relationship Specialty Notifications Start End  Willow OraAndy, Decker Cogdell L, MD PCP - General Family Medicine  04/01/18   York SpanielWillis, Charles K, MD Consulting Physician Neurology  04/01/18   Lewayne Buntingrenshaw, Brian S, MD Consulting Physician Cardiology  04/01/18     Objective  Vitals: BP 122/76   Pulse 63   Temp 98.1 F (36.7 C)   Ht 5\' 7"  (1.702 m)   Wt 186 lb 12.8 oz (84.7 kg)   SpO2 94%   BMI 29.26 kg/m  General:  Well developed, well nourished, no acute distress  Psych:  Alert and orientedx3,normal mood and affect HEENT:  Normocephalic, atraumatic, non-icteric sclera, PERRL, oropharynx is clear without mass or exudate, supple neck without adenopathy, mass or thyromegaly Cardiovascular:  Normal S1, S2, RRR without gallop, rub or murmur, nondisplaced PMI Respiratory:  Good breath sounds bilaterally, CTAB with normal respiratory effort Gastrointestinal: normal bowel sounds, soft, non-tender, no noted masses. No HSM MSK: no deformities, contusions. Joints are without erythema or swelling. Spine and CVA region are nontender Skin:  Warm, no rashes or suspicious lesions noted Neurologic:    Mental status is  normal. CN 2-11 are normal. Gross motor and sensory exams are normal. Normal gait. No tremor Breast Exam: pt declines breast exam but will get mammogram Lab Results  Component Value Date   HGBA1C 5.9 (A) 04/26/2018   HGBA1C 6.1 (H) 12/06/2017      Commons side effects, risks, benefits, and alternatives for medications and treatment plan prescribed today were discussed, and the patient expressed understanding of the given instructions. Patient is instructed to call or message via MyChart if he/she has any questions or concerns regarding our treatment plan. No barriers to understanding were identified. We discussed Red Flag symptoms and signs in detail. Patient expressed understanding regarding what to do in case of urgent or emergency type symptoms.   Medication list was reconciled, printed and provided to the patient in AVS. Patient instructions and summary information was reviewed with the patient as documented in the AVS. This note was prepared with assistance of Dragon voice recognition software. Occasional wrong-word or sound-a-like substitutions may have occurred due to the inherent limitations of voice recognition software

## 2018-05-02 ENCOUNTER — Encounter: Payer: Self-pay | Admitting: Family Medicine

## 2018-05-03 ENCOUNTER — Other Ambulatory Visit: Payer: Self-pay | Admitting: Family Medicine

## 2018-05-03 MED ORDER — LOSARTAN POTASSIUM-HCTZ 50-12.5 MG PO TABS: 1.0000 | ORAL_TABLET | Freq: Every day | ORAL | 3 refills | Status: DC

## 2018-05-03 NOTE — Progress Notes (Signed)
See mychart notes.  Restart hyzaar

## 2018-05-12 LAB — HM MAMMOGRAPHY

## 2018-05-12 LAB — HM DEXA SCAN

## 2018-05-14 ENCOUNTER — Encounter: Payer: Self-pay | Admitting: Family Medicine

## 2018-05-14 ENCOUNTER — Encounter: Payer: Self-pay | Admitting: Emergency Medicine

## 2018-05-14 DIAGNOSIS — M8580 Other specified disorders of bone density and structure, unspecified site: Secondary | ICD-10-CM | POA: Insufficient documentation

## 2018-05-14 DIAGNOSIS — M810 Age-related osteoporosis without current pathological fracture: Secondary | ICD-10-CM

## 2018-05-14 DIAGNOSIS — Z780 Asymptomatic menopausal state: Secondary | ICD-10-CM

## 2018-05-14 HISTORY — DX: Asymptomatic menopausal state: Z78.0

## 2018-05-26 ENCOUNTER — Other Ambulatory Visit (HOSPITAL_COMMUNITY): Payer: Self-pay | Admitting: Interventional Radiology

## 2018-05-26 DIAGNOSIS — I729 Aneurysm of unspecified site: Secondary | ICD-10-CM

## 2018-05-31 ENCOUNTER — Ambulatory Visit: Payer: Medicare Other | Admitting: Family Medicine

## 2018-06-10 ENCOUNTER — Encounter: Payer: Self-pay | Admitting: Family Medicine

## 2018-06-10 ENCOUNTER — Other Ambulatory Visit: Payer: Self-pay

## 2018-06-10 ENCOUNTER — Ambulatory Visit (INDEPENDENT_AMBULATORY_CARE_PROVIDER_SITE_OTHER): Payer: Medicare Other | Admitting: Family Medicine

## 2018-06-10 VITALS — BP 124/78 | HR 68 | Temp 98.2°F | Ht 67.0 in | Wt 187.2 lb

## 2018-06-10 DIAGNOSIS — I10 Essential (primary) hypertension: Secondary | ICD-10-CM

## 2018-06-10 NOTE — Progress Notes (Signed)
Subjective  CC:  Chief Complaint  Patient presents with  . Hypertension    d/c HCTZ because of Headaches, started back taking both Blood Pressure Medications stablized. Patient has logs, declines flu shot     HPI: Allison Richardson is a 72 y.o. female who presents to the office today to address the problems listed above in the chief complaint.  Hypertension f/u: Patient presents with blood pressure log.  In May and June we were adjusting her medications because she was running low and having some fatigue.  She maintains beta-blocker for palpitation but we stopped losartan HCTZ due to low blood pressures.  She had a few days with significant headaches and she thought it was associated to an elevated blood pressure so she restarted those medications.  Looking at her blood pressure log, she again is running low with many blood pressures in the 80s over 50s.  She says she feels fine without symptoms of hypotension.  Assessment  1. Essential hypertension      Plan    Hypertension f/u: BP control is tightly controlled.  I would prefer her blood pressures to be higher but she is skeptical.  I recommend she follow-up with cardiology and neurology to review the low blood pressures.  I discussed how hypotension would want to be avoided in the setting of history of cerebrovascular disease.  Education regarding management of these chronic disease states was given. Management strategies discussed on successive visits include dietary and exercise recommendations, goals of achieving and maintaining IBW, and lifestyle modifications aiming for adequate sleep and minimizing stressors.   Follow up: Return in about 6 months (around 12/11/2018) for follow up Hypertension.  No orders of the defined types were placed in this encounter.  No orders of the defined types were placed in this encounter.     BP Readings from Last 3 Encounters:  06/10/18 124/78  04/26/18 122/76  04/01/18 120/86   Wt Readings from  Last 3 Encounters:  06/10/18 187 lb 3.2 oz (84.9 kg)  04/26/18 186 lb 12.8 oz (84.7 kg)  04/01/18 187 lb 12.8 oz (85.2 kg)    Lab Results  Component Value Date   CHOL 154 12/06/2017   Lab Results  Component Value Date   HDL 49 12/06/2017   Lab Results  Component Value Date   LDLCALC 86 12/06/2017   Lab Results  Component Value Date   TRIG 93 12/06/2017   Lab Results  Component Value Date   CHOLHDL 3.1 12/06/2017   No results found for: LDLDIRECT Lab Results  Component Value Date   CREATININE 0.8 02/09/2018   BUN 21 02/09/2018   NA 137 02/09/2018   K 3.8 02/09/2018   CL 103 12/06/2017   CO2 25 12/06/2017    The 10-year ASCVD risk score Mikey Bussing DC Jr., et al., 2013) is: 14%   Values used to calculate the score:     Age: 69 years     Sex: Female     Is Non-Hispanic African American: No     Diabetic: No     Tobacco smoker: No     Systolic Blood Pressure: 314 mmHg     Is BP treated: Yes     HDL Cholesterol: 49 mg/dL     Total Cholesterol: 154 mg/dL  I reviewed the patients updated PMH, FH, and SocHx.    Patient Active Problem List   Diagnosis Date Noted  . Osteopenia after menopause 05/14/2018  . History of migraine headaches 04/01/2018  .  Vitamin B 12 deficiency 02/10/2018  . Nonintractable episodic headache 01/27/2018  . 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 TIA (transient ischemic attack) 12/05/2017  . Essential hypertension 12/05/2017  . GERD (gastroesophageal reflux disease) 11/16/2017  . IBS (irritable bowel syndrome) 11/16/2017  . Hyperlipidemia 10/15/2017  . Paroxysmal SVT (supraventricular tachycardia) ([place]) 07/07/2016  . Impaired fasting glucose 06/03/2016    Allergies: Other; Penicillins; Simvastatin; and Sumatriptan succinate  Social History: Patient  reports that she has never smoked. She has never used smokeless tobacco. She reports that she does not drink alcohol or use drugs.  Current Meds  Medication Sig  . aspirin EC  81 MG tablet Take 1 tablet (81 mg total) by mouth daily.  Marland Kitchen atorvastatin (LIPITOR) 20 MG tablet Take 1 tablet (20 mg total) by mouth at bedtime.  . Cholecalciferol (VITAMIN D3) 400 units CHEW Chew by mouth.  . Cyanocobalamin (B-12 COMPLIANCE INJECTION) 1000 MCG/ML KIT Inject 1,000 mg as directed once a week.  . losartan-hydrochlorothiazide (HYZAAR) 50-12.5 MG tablet Take 1 tablet by mouth daily.  . metoprolol succinate (TOPROL-XL) 25 MG 24 hr tablet Take 25 mg by mouth daily.  Marland Kitchen omeprazole (PRILOSEC) 40 MG capsule   . Probiotic Product (PRO-BIOTIC BLEND PO) Take 1 tablet by mouth daily.  . Wheat Dextrin (BENEFIBER DRINK MIX PO) Take by mouth.  . [DISCONTINUED] Ascorbic Acid (VITAMIN C) 1000 MG tablet Take 1,000 mg by mouth daily.    Review of Systems: Cardiovascular: negative for chest pain, palpitations, leg swelling, orthopnea Respiratory: negative for SOB, wheezing or persistent cough Gastrointestinal: negative for abdominal pain Genitourinary: negative for dysuria or gross hematuria  Objective  Vitals: BP 124/78   Pulse 68   Temp 98.2 F (36.8 C)   Ht '5\' 7"'$  (1.702 m)   Wt 187 lb 3.2 oz (84.9 kg)   SpO2 98%   BMI 29.32 kg/m  General: no acute distress  Psych:  Alert and oriented, normal mood and affect HEENT:  Normocephalic, atraumatic, supple neck  Cardiovascular:  RRR without murmur. no edema Respiratory:  Good breath sounds bilaterally, CTAB with normal respiratory effort  SCommons side effects, risks, benefits, and alternatives for medications and treatment plan prescribed today were discussed, and the patient expressed understanding of the given instructions. Patient is instructed to call or message via MyChart if he/she has any questions or concerns regarding our treatment plan. No barriers to understanding were identified. We discussed Red Flag symptoms and signs in detail. Patient expressed understanding regarding what to do in case of urgent or emergency type symptoms.     Medication list was reconciled, printed and provided to the patient in AVS. Patient instructions and summary information was reviewed with the patient as documented in the AVS. This note was prepared with assistance of Dragon voice recognition software. Occasional wrong-word or sound-a-like substitutions may have occurred due to the inherent limitations of voice recognition software

## 2018-06-10 NOTE — Patient Instructions (Addendum)
Please return in 6 months for follow up of your hypertension. Please schedule a nurse visit for your high dose flu vaccine.   Please monitor your blood pressures and review with your cardiologist and neurologist. I'd prefer adjusting your medications for a goal of 90-130/60-80.   If you have any questions or concerns, please don't hesitate to send me a message via MyChart or call the office at [phone]. Thank you for visiting with us today! It's our pleasure caring for you.

## 2018-06-11 ENCOUNTER — Ambulatory Visit (HOSPITAL_COMMUNITY): Payer: Medicare Other

## 2018-06-21 ENCOUNTER — Ambulatory Visit (HOSPITAL_COMMUNITY)
Admission: RE | Admit: 2018-06-21 | Discharge: 2018-06-21 | Disposition: A | Discharge status: Home / Self Care | Payer: Medicare Other | Source: Ambulatory Visit | Attending: Interventional Radiology | Admitting: Interventional Radiology

## 2018-06-21 ENCOUNTER — Encounter (HOSPITAL_COMMUNITY): Payer: Self-pay

## 2018-06-21 ENCOUNTER — Other Ambulatory Visit (HOSPITAL_COMMUNITY): Payer: Self-pay | Admitting: Interventional Radiology

## 2018-06-21 DIAGNOSIS — I729 Aneurysm of unspecified site: Secondary | ICD-10-CM

## 2018-06-21 DIAGNOSIS — I671 Cerebral aneurysm, nonruptured: Secondary | ICD-10-CM | POA: Insufficient documentation

## 2018-06-22 ENCOUNTER — Telehealth (HOSPITAL_COMMUNITY): Payer: Self-pay

## 2018-06-22 NOTE — Telephone Encounter (Signed)
Pt agreed to f/u in 1 year with mri/mra w/o contrast. AW 

## 2018-06-28 ENCOUNTER — Ambulatory Visit (INDEPENDENT_AMBULATORY_CARE_PROVIDER_SITE_OTHER): Payer: Medicare Other | Admitting: Neurology

## 2018-06-28 ENCOUNTER — Encounter: Payer: Self-pay | Admitting: Neurology

## 2018-06-28 VITALS — BP 118/60 | HR 61 | Wt 185.0 lb

## 2018-06-28 DIAGNOSIS — G459 Transient cerebral ischemic attack, unspecified: Secondary | ICD-10-CM

## 2018-06-28 NOTE — Progress Notes (Signed)
Reason for visit: Possible TIA  Allison Richardson is an 72 y.o. female  History of present illness:  Allison Richardson is a 72 year old right-handed white female with a history of an event that occurred in February 2019 where she suddenly had weakness of the legs and fell the floor without loss of consciousness.  The patient has undergone a 30-day cardiac monitor [facility], she claims that this was unremarkable.  The patient has not had any further recurring events.  She has been diligent about taking her blood pressures, she has documented that at times her blood pressure is going to the 70 systolic range, she is not symptomatic with exception of some fatigue with these events.  She has been taken off of her blood pressure medication but her blood pressure is elevated and she began having some problems with headache.  She decided to go back on the blood pressure medications and she feels better, but at times her systolic blood pressures will drop in the 70 or 80 range.  The patient is drinking plenty of fluids.  She returns for an evaluation.  The patient underwent a MRA of the head recently to evaluate for a 1.5 mm left anterior communicating artery aneurysm.  No treatment was recommended at this time.  The patient is followed by Dr. Corliss Skainseveshwar.  Past Medical History:  Diagnosis Date  . GERD (gastroesophageal reflux disease)   . Hyperlipidemia 12/05/2017  . Hypertension   . Hypothyroid   . Osteopenia after menopause 05/14/2018   DEXA 04/2018, [facility]: Impression- Osteopenia   T Scores  R Femur Neck- -0.70 L Femur Neck- -1.20 R Total Femur- -0.40 L Total Femur- -1.50 AP Total Spine: -0.30   . TIA (transient ischemic attack) 12/05/2017    Past Surgical History:  Procedure Laterality Date  . ABDOMINAL HYSTERECTOMY    . APPENDECTOMY    . BUNIONECTOMY Left   . IR RADIOLOGIST EVAL & MGMT  01/05/2018    Family History  Problem Relation Age of Onset  . Stroke Mother   . High blood  pressure Mother   . High Cholesterol Mother   . Stroke Father   . High blood pressure Father   . High Cholesterol Father   . Cancer Father   . Heart attack Maternal Grandfather   . Heart disease Maternal Grandfather   . Stroke Maternal Grandfather   . Early death Paternal Grandmother   . Heart attack Paternal Grandmother   . Heart disease Paternal Grandmother   . Heart attack Paternal Grandfather   . Heart disease Paternal Grandfather     Social history:  reports that she has never smoked. She has never used smokeless tobacco. She reports that she does not drink alcohol or use drugs.    Allergies  Allergen Reactions  . Other     Has had adverse reaction to a migraine medication  . Penicillins Itching  . Simvastatin Other (See Comments)  . Sumatriptan Succinate     Hypotension    Medications:  Prior to Admission medications   Medication Sig Start Date End Date Taking? Authorizing Provider  aspirin EC 81 MG tablet Take 1 tablet (81 mg total) by mouth daily. 12/07/17 12/07/18 Yes Calvert Cantorizwan, Saima, MD  atorvastatin (LIPITOR) 20 MG tablet Take 1 tablet (20 mg total) by mouth at bedtime. 04/26/18  Yes Willow OraAndy, Camille L, MD  losartan-hydrochlorothiazide (HYZAAR) 50-12.5 MG tablet Take 1 tablet by mouth daily. 05/03/18  Yes Willow OraAndy, Camille L, MD  metoprolol succinate (TOPROL-XL) 25 MG 24 hr tablet Take 25 mg by mouth daily.   Yes [provider]  omeprazole (PRILOSEC) 40 MG capsule  04/08/18  Yes [provider]  Probiotic Product (PRO-BIOTIC BLEND PO) Take 1 tablet by mouth daily.   Yes [provider]  Wheat Dextrin (BENEFIBER DRINK MIX PO) Take by mouth.   Yes [provider]    ROS:  Out of a complete 14 system review of symptoms, the patient complains only of the following symptoms, and all other reviewed systems are negative.  Frequent waking Neck pain  Blood pressure 118/60, pulse 61, weight 185 lb (83.9 kg).  Physical Exam  General: The  patient is alert and cooperative at the time of the examination.  Skin: No significant peripheral edema is noted.   Neurologic Exam  Mental status: The patient is alert and oriented x 3 at the time of the examination. The patient has apparent normal recent and remote memory, with an apparently normal attention span and concentration ability.   Cranial nerves: Facial symmetry is present. Speech is normal, no aphasia or dysarthria is noted. Extraocular movements are full. Visual fields are full.  Motor: The patient has good strength in all 4 extremities.  Sensory examination: Soft touch sensation is symmetric on the face, arms, and legs.  Coordination: The patient has good finger-nose-finger and heel-to-shin bilaterally.  Gait and station: The patient has a normal gait. Tandem gait is normal. Romberg is negative. No drift is seen.  Reflexes: Deep tendon reflexes are symmetric.   Assessment/Plan:  1.  Possible TIA, likely episode of hypotension  2.  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  It is possible that converting to a single blood pressure agent may benefit her blood pressure issues without causing significant hypotension.  The patient will discuss this with her primary care physician.  The patient otherwise will follow-up through this office if needed.  Greater than 50% of the visit was spent in counseling and coordination of care.  Face-to-face time with the patient was 20 minutes.   Marlan Palau MD 06/28/2018 10:02 AM  [facility] [address] [place], [place] [address]  Phone [phone] Fax [phone]

## 2018-08-02 ENCOUNTER — Telehealth: Payer: Self-pay | Admitting: Cardiology

## 2018-08-02 NOTE — Telephone Encounter (Signed)
New Message   Pt c/o medication issue:  1. Name of Medication: metoprolol succinate (TOPROL-XL) 25 MG 24 hr tablet  2. How are you currently taking this medication (dosage and times per day)? Take 25 mg by mouth daily  3. Are you having a reaction (difficulty breathing--STAT)? yes  4. What is your medication issue? Pt states that she has been experiencing erratic blood pressures and feels like she needs some medication readjustments. Please call

## 2018-08-02 NOTE — Telephone Encounter (Signed)
Spoke with pt husband, Follow up scheduled  

## 2018-08-02 NOTE — Telephone Encounter (Signed)
Schedule follow-up office visit when available Allison Millers, MD

## 2018-08-02 NOTE — Telephone Encounter (Signed)
Contact patient regarding her BP issues. She states that about 2 weeks ago, she had a collapsing episode, and her husband checked her BP at that time and it was very low, since then she has cut her metoprolol in half, but has noticed higher BP at night when going to sleep. The last couple of nights BP were 170/79, and 150/55, she states when her BP is above 150 she takes an entire tablet. She took a whole tablet last night, and this morning she woke and became very dizzy, very weak feeling and now she feels that her medication is off track, and she would like to only see Dr.Crenshaw. She would like to be seen at the [place], but unable to see any opening for his schedule until January. I did advised we could check the [place] schedule, and opening for that were not until December. She was given the option to see an extender but refused and wanted to see Dr.Crenshaw, asked that I speak with his nurse to find out if there were any other openings. Please advise, thank you!

## 2018-08-03 NOTE — Progress Notes (Signed)
HPI: FU hypertension.  Echocardiogram February 2019 showed normal LV function, mild mitral regurgitation and mild left atrial enlargement.  MRA February 2019 showed no significant stenosis.  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  Seen by interventional radiology and options included arteriogram versus watchful waiting.  Event monitor March 2019 showed no arrhythmia. Also h/o TIA. Since last seen, she denies dyspnea, chest pain or palpitations.  She has been checking her blood pressure 3 times daily.  It recently decreased and she decreased her Toprol to 25 mg daily.  Blood pressure has been stable since that time in reviewing her records.  Current Outpatient Medications  Medication Sig Dispense Refill  . aspirin EC 81 MG tablet Take 1 tablet (81 mg total) by mouth daily. 150 tablet 2  . atorvastatin (LIPITOR) 20 MG tablet Take 1 tablet (20 mg total) by mouth at bedtime. 90 tablet 3  . losartan-hydrochlorothiazide (HYZAAR) 50-12.5 MG tablet Take 1 tablet by mouth daily. 90 tablet 3  . metoprolol succinate (TOPROL-XL) 25 MG 24 hr tablet Take 25 mg by mouth daily.    Marland Kitchen omeprazole (PRILOSEC) 40 MG capsule     . Probiotic Product (PRO-BIOTIC BLEND PO) Take 1 tablet by mouth daily.    . Wheat Dextrin (BENEFIBER DRINK MIX PO) Take by mouth.     No current facility-administered medications for this visit.      Past Medical History:  Diagnosis Date  . GERD (gastroesophageal reflux disease)   . Hyperlipidemia 12/05/2017  . Hypertension   . Hypothyroid   . Osteopenia after menopause 05/14/2018   DEXA 04/2018, [facility]: Impression- Osteopenia   T Scores  R Femur Neck- -0.70 L Femur Neck- -1.20 R Total Femur- -0.40 L Total Femur- -1.50 AP Total Spine: -0.30   . TIA (transient ischemic attack) 12/05/2017    Past Surgical History:  Procedure Laterality Date  . ABDOMINAL HYSTERECTOMY    . APPENDECTOMY    . BUNIONECTOMY Left   . IR RADIOLOGIST EVAL & MGMT  01/05/2018    Social History     Socioeconomic History  . Marital status: Married    Spouse name: Not on file  . Number of children: 0  . Years of education: 18-20  . Highest education level: Not on file  Occupational History  . Occupation: Retired  Engineer, production  . Financial resource strain: Not on file  . Food insecurity:    Worry: Not on file    Inability: Not on file  . Transportation needs:    Medical: Not on file    Non-medical: Not on file  Tobacco Use  . Smoking status: Never Smoker  . Smokeless tobacco: Never Used  Substance and Sexual Activity  . Alcohol use: No    Frequency: Never  . Drug use: No  . Sexual activity: Yes  Lifestyle  . Physical activity:    Days per week: Not on file    Minutes per session: Not on file  . Stress: Not on file  Relationships  . Social connections:    Talks on phone: Not on file    Gets together: Not on file    Attends religious service: Not on file    Active member of club or organization: Not on file    Attends meetings of clubs or organizations: Not on file    Relationship status: Not on file  . Intimate partner violence:    Fear of current or ex partner:  Not on file    Emotionally abused: Not on file    Physically abused: Not on file    Forced sexual activity: Not on file  Other Topics Concern  . Not on file  Social History Narrative   Lives with husband   Caffeine use: Coffee daily   Right handed    Family History  Problem Relation Age of Onset  . Stroke Mother   . High blood pressure Mother   . High Cholesterol Mother   . Stroke Father   . High blood pressure Father   . High Cholesterol Father   . Cancer Father   . Heart attack Maternal Grandfather   . Heart disease Maternal Grandfather   . Stroke Maternal Grandfather   . Early death Paternal Grandmother   . Heart attack Paternal Grandmother   . Heart disease Paternal Grandmother   . Heart attack Paternal Grandfather   . Heart disease Paternal Grandfather     ROS: no fevers or  chills, productive cough, hemoptysis, dysphasia, odynophagia, melena, hematochezia, dysuria, hematuria, rash, seizure activity, orthopnea, PND, pedal edema, claudication. Remaining systems are negative.  Physical Exam: Well-developed well-nourished in no acute distress.  Skin is warm and dry.  HEENT is normal.  Neck is supple.  Chest is clear to auscultation with normal expansion.  Cardiovascular exam is regular rate and rhythm.  Abdominal exam nontender or distended. No masses palpated. Extremities show no edema. neuro grossly intact   A/P  1 hypertension-patient's blood pressure is controlled today.  I reviewed her home blood pressures today.  She has rare occasions when her systolic is higher in the 150-170 range and occasions when it is 90.  However in general it is well controlled.  Continue present medications and follow.  2 hyperlipidemia-continue statin.  3 history of cerebral aneurysm-this is followed by neurology and interventional radiology.  Continue aspirin.  Olga Millers, MD

## 2018-08-04 ENCOUNTER — Encounter: Payer: Self-pay | Admitting: Cardiology

## 2018-08-04 ENCOUNTER — Ambulatory Visit (INDEPENDENT_AMBULATORY_CARE_PROVIDER_SITE_OTHER): Payer: Medicare Other | Admitting: Cardiology

## 2018-08-04 VITALS — BP 134/82 | HR 73 | Ht 67.0 in | Wt 182.1 lb

## 2018-08-04 DIAGNOSIS — I10 Essential (primary) hypertension: Secondary | ICD-10-CM | POA: Diagnosis not present

## 2018-08-04 DIAGNOSIS — E7800 Pure hypercholesterolemia, unspecified: Secondary | ICD-10-CM

## 2018-08-04 NOTE — Patient Instructions (Signed)
Your physician recommends that you schedule a follow-up appointment in: ONE YEAR WITH DR CRENSHAW PLEASE GIVE OUR OFFICE A CALL 2 MONTHS PRIOR TO THAT APPOINTMENT TIME TO SCHEDULE   

## 2018-08-26 ENCOUNTER — Ambulatory Visit: Payer: Medicare Other

## 2018-09-27 ENCOUNTER — Other Ambulatory Visit: Payer: Self-pay | Admitting: Family Medicine

## 2018-09-27 MED ORDER — OMEPRAZOLE 40 MG PO CPDR: 40.0000 mg | DELAYED_RELEASE_CAPSULE | Freq: Every day | ORAL | 3 refills | Status: DC

## 2018-09-27 NOTE — Telephone Encounter (Signed)
Requested medication (s) are due for refill today: Yes  Requested medication (s) are on the active medication list: Yes  Last refill:  04/26/18  Future visit scheduled: Yes  Notes to clinic:  See request    Requested Prescriptions  Pending Prescriptions Disp Refills   omeprazole (PRILOSEC) 40 MG capsule       Gastroenterology: Proton Pump Inhibitors Passed - 09/27/2018 11:08 AM      Passed - Valid encounter within last 12 months    Recent Outpatient Visits          3 months ago Essential hypertension   [facility] [place], Malachi Bondsamille L, MD   5 months ago Essential hypertension   [facility] [place] Mal[place], [place]   5 months ago Essential hypertension   [facility] [place], Malachi Bondsamille L, MD      Future Appointments            In 2 months Willow OraAndy, Camille L, MD [facility]

## 2018-09-27 NOTE — Telephone Encounter (Signed)
Copied from CRM [id number]. Topic: Quick Communication - Rx Refill/Question >> Sep 27, 2018  9:41 AM Wyonia HoughJohnson, Chaz E wrote: Medication: omeprazole (PRILOSEC) 40 MG capsule  Pt ask to make a note for the pharmacy to delivery the meds/advised Pt to call pharmacy as well for this option.   Has the patient contacted their pharmacy? No.  Preferred Pharmacy (with phone number or street name): [facility] [place] [place] - [address] [phone] (Phone) [phone] (Fax)    Agent: Please be advised that RX refills may take up to 3 business days. We ask that you follow-up with your pharmacy.

## 2018-10-01 ENCOUNTER — Telehealth: Payer: Self-pay | Admitting: Emergency Medicine

## 2018-10-01 MED ORDER — HYDROCHLOROTHIAZIDE 12.5 MG PO TABS: 12.5000 mg | ORAL_TABLET | Freq: Every day | ORAL | 1 refills | Status: DC

## 2018-10-01 MED ORDER — LOSARTAN POTASSIUM 50 MG PO TABS: 50.0000 mg | ORAL_TABLET | Freq: Every day | ORAL | 1 refills | Status: DC

## 2018-10-01 NOTE — Telephone Encounter (Signed)
Copied from CRM [id number]. Topic: Quick Communication - See Telephone Encounter >> Oct 01, 2018 10:41 AM Mare LoanBurton, Donna F wrote: Pt is calling say that her insurance does not cover the combination of HYZAAR and that they are needing to have two separate rxs so the insurance will cover it    [facility] [phone]   [facility] [phone]

## 2018-10-01 NOTE — Telephone Encounter (Signed)
RX sent to [facility] for 2 Losartan and HTCZ.

## 2018-10-01 NOTE — Addendum Note (Signed)
Addended by: Erenest BlankSIMMONS, Jasmeet Manton S on: 10/01/2018 11:40 AM   Modules accepted: Orders

## 2018-12-07 ENCOUNTER — Ambulatory Visit: Payer: Medicare Other | Admitting: Family Medicine

## 2019-07-14 ENCOUNTER — Other Ambulatory Visit (HOSPITAL_COMMUNITY): Payer: Self-pay | Admitting: Interventional Radiology

## 2019-07-14 DIAGNOSIS — I671 Cerebral aneurysm, nonruptured: Secondary | ICD-10-CM

## 2019-08-03 ENCOUNTER — Ambulatory Visit (HOSPITAL_COMMUNITY)
Admission: RE | Admit: 2019-08-03 | Discharge: 2019-08-03 | Disposition: A | Discharge status: Home / Self Care | Payer: Medicare Other | Source: Ambulatory Visit | Attending: Interventional Radiology | Admitting: Interventional Radiology

## 2019-08-03 ENCOUNTER — Other Ambulatory Visit: Payer: Self-pay

## 2019-08-03 DIAGNOSIS — I671 Cerebral aneurysm, nonruptured: Secondary | ICD-10-CM | POA: Insufficient documentation

## 2019-08-09 ENCOUNTER — Telehealth: Payer: Self-pay | Admitting: Physician Assistant

## 2019-08-09 ENCOUNTER — Telehealth (HOSPITAL_COMMUNITY): Payer: Self-pay

## 2019-08-09 NOTE — Progress Notes (Signed)
  Patient requested results of MRA.  MRA reviewed by Dr. Estanislado Pandy.  MRA showed= 1. Negative MRI head 2. 1.5 mm aneurysm left anterior communicating artery unchanged from the prior study. Both anterior cerebral arteries supplied from the Left.  Called patient and gave her results.   She inquired about blood pressure control and she takes 3 medications.  She tells me she is seeing cardiology next week regarding BP. Recommend Renal Artery Duplex to evaluate for stenosis.  Will repeat MRI in 1 year. Message sent to [facility]. for scheduling.  Lorrain Rivers S Tiawanna Luchsinger PA-C 08/09/2019 10:24 AM

## 2019-08-09 NOTE — Telephone Encounter (Signed)
Returned pt's call, no answer, phone rang until a busy signa came on. Will try back later. AW

## 2019-08-11 NOTE — Progress Notes (Signed)
HPI: FU hypertension. Echocardiogram February 2019 showed normal LV function,mild mitral regurgitation and mild left atrial enlargement. MRA February 2019 showed no significant stenosis. 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 Event monitor March 2019 showed no arrhythmia. Also h/o TIA. Since last seen,  there is no dyspnea, chest pain, palpitations or syncope.  She is having problems with labile blood pressure.  It is well controlled in the morning but in the evening is increasing with associated pressure in her head and palpitations.  Her systolic is in the 150 range at that time.  Current Outpatient Medications  Medication Sig Dispense Refill  . acetaminophen (TYLENOL) 500 MG tablet Take 500 mg by mouth every 6 (six) hours as needed.    Marland Kitchen aspirin EC 81 MG tablet Take 81 mg by mouth daily.    Marland Kitchen atorvastatin (LIPITOR) 20 MG tablet Take 1 tablet (20 mg total) by mouth at bedtime. 90 tablet 3  . cyanocobalamin (,VITAMIN B-12,) 1000 MCG/ML injection Inject into the muscle. Use as directed    . hydrochlorothiazide (HYDRODIURIL) 12.5 MG tablet Take 1 tablet (12.5 mg total) by mouth daily. 90 tablet 1  . losartan (COZAAR) 50 MG tablet Take 1 tablet (50 mg total) by mouth daily. 90 tablet 1  . losartan-hydrochlorothiazide (HYZAAR) 50-12.5 MG tablet Take 1 tablet by mouth daily. 90 tablet 3  . metoprolol succinate (TOPROL-XL) 25 MG 24 hr tablet Take 25 mg by mouth daily.    . nebivolol (BYSTOLIC) 5 MG tablet Take 2.5 mg by mouth 2 (two) times daily.    Marland Kitchen omeprazole (PRILOSEC) 40 MG capsule Take 1 capsule (40 mg total) by mouth daily. 90 capsule 3  . Probiotic Product (PRO-BIOTIC BLEND PO) Take 1 tablet by mouth daily.    . Wheat Dextrin (BENEFIBER DRINK MIX PO) Take by mouth.     No current facility-administered medications for this visit.      Past Medical History:  Diagnosis Date  .  GERD (gastroesophageal reflux disease)   . Hyperlipidemia 12/05/2017  . Hypertension   . Hypothyroid   . Osteopenia after menopause 05/14/2018   DEXA 04/2018, [facility]: Impression- Osteopenia   T Scores  R Femur Neck- -0.70 L Femur Neck- -1.20 R Total Femur- -0.40 L Total Femur- -1.50 AP Total Spine: -0.30   . TIA (transient ischemic attack) 12/05/2017    Past Surgical History:  Procedure Laterality Date  . ABDOMINAL HYSTERECTOMY    . APPENDECTOMY    . BUNIONECTOMY Left   . IR RADIOLOGIST EVAL & MGMT  01/05/2018    Social History   Socioeconomic History  . Marital status: Married    Spouse name: Not on file  . Number of children: 0  . Years of education: 18-20  . Highest education level: Not on file  Occupational History  . Occupation: Retired  Engineer, production  . Financial resource strain: Not on file  . Food insecurity    Worry: Not on file    Inability: Not on file  . Transportation needs    Medical: Not on file    Non-medical: Not on file  Tobacco Use  . Smoking status: Never Smoker  . Smokeless tobacco: Never Used  Substance and Sexual Activity  . Alcohol use: No    Frequency: Never  . Drug use: No  . Sexual activity: Yes  Lifestyle  . Physical activity    Days per  week: Not on file    Minutes per session: Not on file  . Stress: Not on file  Relationships  . Social Herbalist on phone: Not on file    Gets together: Not on file    Attends religious service: Not on file    Active member of club or organization: Not on file    Attends meetings of clubs or organizations: Not on file    Relationship status: Not on file  . Intimate partner violence    Fear of current or ex partner: Not on file    Emotionally abused: Not on file    Physically abused: Not on file    Forced sexual activity: Not on file  Other Topics Concern  . Not on file  Social History Narrative   Lives with husband   Caffeine use: Coffee daily   Right handed    Family History  Problem  Relation Age of Onset  . Stroke Mother   . High blood pressure Mother   . High Cholesterol Mother   . Stroke Father   . High blood pressure Father   . High Cholesterol Father   . Cancer Father   . Heart attack Maternal Grandfather   . Heart disease Maternal Grandfather   . Stroke Maternal Grandfather   . Early death Paternal Grandmother   . Heart attack Paternal Grandmother   . Heart disease Paternal Grandmother   . Heart attack Paternal Grandfather   . Heart disease Paternal Grandfather     ROS: no fevers or chills, productive cough, hemoptysis, dysphasia, odynophagia, melena, hematochezia, dysuria, hematuria, rash, seizure activity, orthopnea, PND, pedal edema, claudication. Remaining systems are negative.  Physical Exam: Well-developed well-nourished in no acute distress.  Skin is warm and dry.  HEENT is normal.  Neck is supple.  Chest is clear to auscultation with normal expansion.  Cardiovascular exam is regular rate and rhythm.  Abdominal exam nontender or distended. No masses palpated. Extremities show no edema. neuro grossly intact  ECG-sinus bradycardia at a rate of 59, no ST changes.  Personally reviewed  A/P  1 hypertension-patient's blood pressure is labile at home.  It is controlled in the morning but elevated in the evening.  I will change losartan to 50 mg twice daily.  Hopefully the evening dose will keep her blood pressure controlled at night and not cause significantly low blood pressure during the morning.  Check potassium and renal function in 2 weeks.  2 hyperlipidemia-continue statin.  3 history of cerebral aneurysm-managed by neurology and interventional radiology.  Continue aspirin.  Kirk Ruths, MD

## 2019-08-15 ENCOUNTER — Ambulatory Visit (INDEPENDENT_AMBULATORY_CARE_PROVIDER_SITE_OTHER): Payer: Medicare Other | Admitting: Cardiology

## 2019-08-15 ENCOUNTER — Encounter: Payer: Self-pay | Admitting: Cardiology

## 2019-08-15 ENCOUNTER — Other Ambulatory Visit: Payer: Self-pay

## 2019-08-15 VITALS — BP 120/70 | HR 59 | Ht 67.0 in | Wt 177.8 lb

## 2019-08-15 DIAGNOSIS — E7800 Pure hypercholesterolemia, unspecified: Secondary | ICD-10-CM

## 2019-08-15 DIAGNOSIS — I671 Cerebral aneurysm, nonruptured: Secondary | ICD-10-CM | POA: Diagnosis not present

## 2019-08-15 DIAGNOSIS — I10 Essential (primary) hypertension: Secondary | ICD-10-CM

## 2019-08-15 MED ORDER — LOSARTAN POTASSIUM 50 MG PO TABS: 50.0000 mg | ORAL_TABLET | Freq: Two times a day (BID) | ORAL | 3 refills | Status: DC

## 2019-08-15 NOTE — Patient Instructions (Signed)
Medication Instructions:  INCREASE LOSARTAN TO 50 MG TWICE DAILY  *If you need a refill on your cardiac medications before your next appointment, please call your pharmacy*  Lab Work: Your physician recommends that you return for lab work =BMP  If you have labs (blood work) drawn today and your tests are completely normal, you will receive your results only by: Marland Kitchen MyChart Message (if you have MyChart) OR . A paper copy in the mail If you have any lab test that is abnormal or we need to change your treatment, we will call you to review the results.  Follow-Up: At [facility], you and your health needs are our priority.  As part of our continuing mission to provide you with exceptional heart care, we have created designated Provider Care Teams.  These Care Teams include your primary Cardiologist (physician) and Advanced Practice Providers (APPs -  Physician Assistants and Nurse Practitioners) who all work together to provide you with the care you need, when you need it.  Your next appointment:   6 months  The format for your next appointment:   In Person  Provider:   Kirk Ruths, MD

## 2019-08-15 NOTE — Addendum Note (Signed)
Addended by: Cristopher Estimable on: 08/15/2019 11:46 AM   Modules accepted: Orders

## 2019-09-12 ENCOUNTER — Encounter: Payer: Self-pay | Admitting: *Deleted

## 2019-09-28 ENCOUNTER — Ambulatory Visit: Payer: Medicare Other | Admitting: Cardiology

## 2019-10-22 IMAGING — CT CT HEAD W/O CM
3 series · 15 of 47 positions shown, 18 images · non-contrast
Comparison: None.

CLINICAL DATA: Pt states she has numbness in her right leg, legs
gave out yesterday, headache, pt states she just felt "weird", no
blurred vision, no hx stroke, HTN on many meds, mild headache right
frontal area

EXAM:
CT HEAD WITHOUT CONTRAST
TECHNIQUE: Contiguous axial images were obtained from the base of the skull
through the vertex without intravenous contrast.

[Series 2: head wo · axial · 0.42mm/px · z∈[-169,-44]mm · 9 of 30 slices shown, 12 images]
[im 3/30  brain]
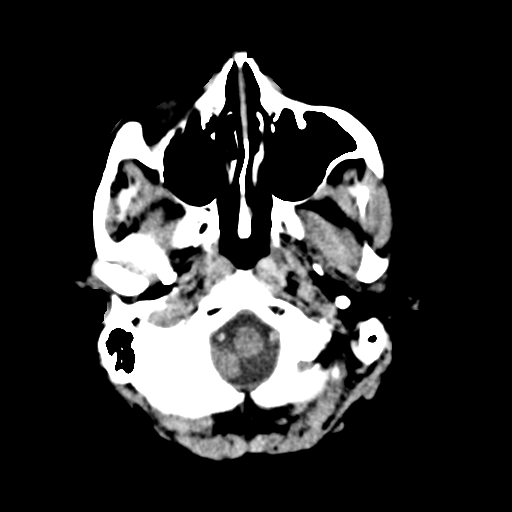
[im 3/30  bone]
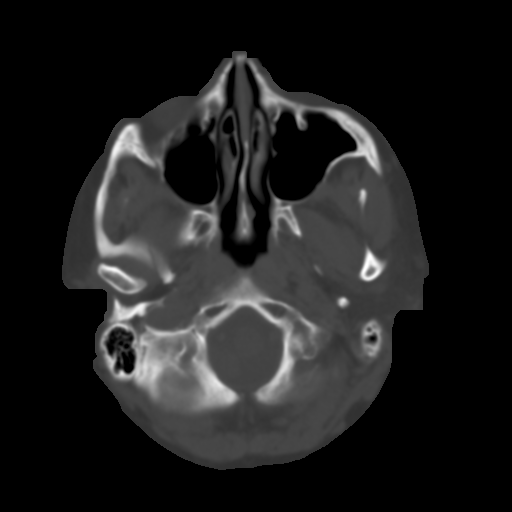
[im 6/30  brain]
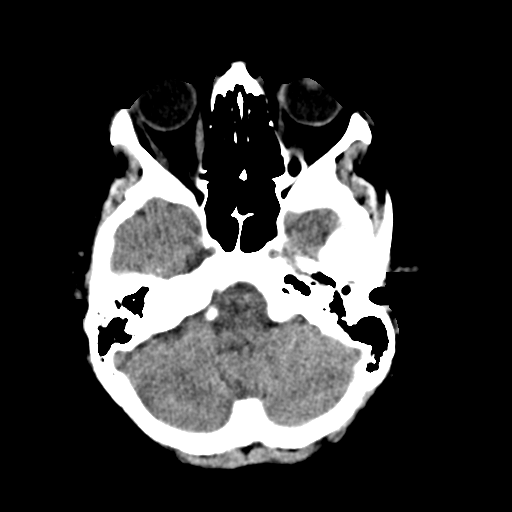
[im 9/30  brain]
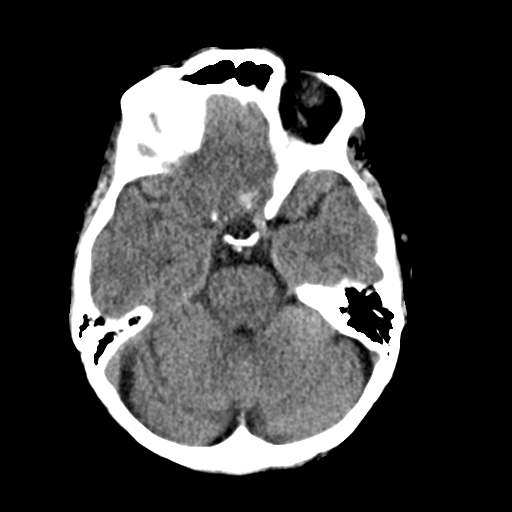
[im 12/30  brain]
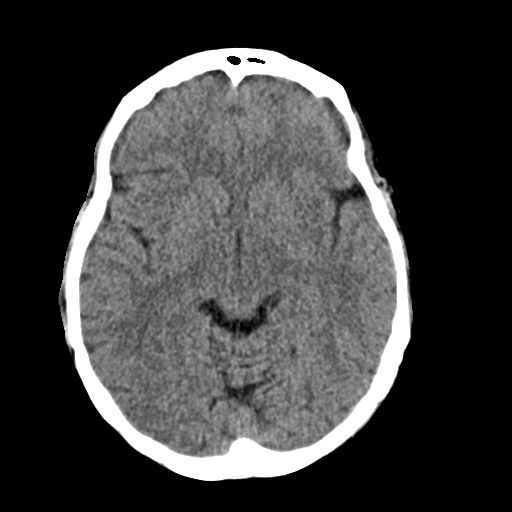
[im 16/30  brain]
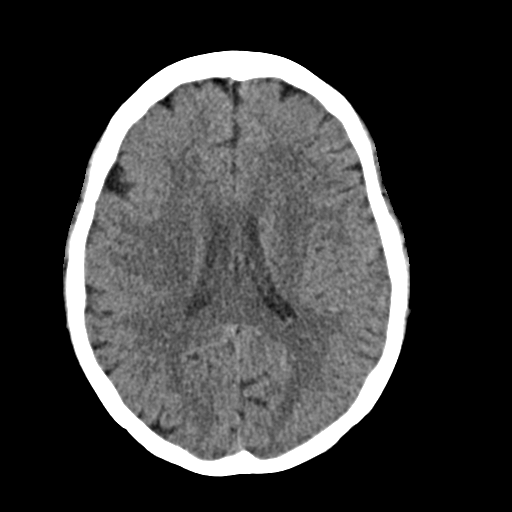
[im 16/30  bone]
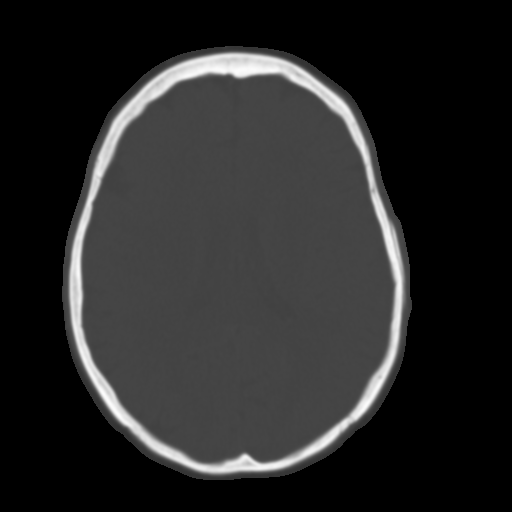
[im 19/30  brain]
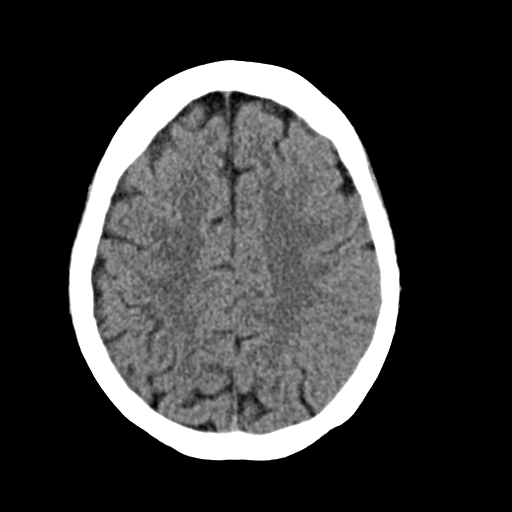
[im 22/30  brain]
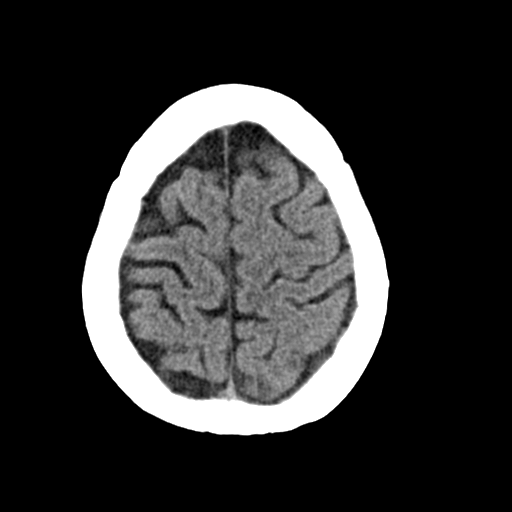
[im 25/30  brain]
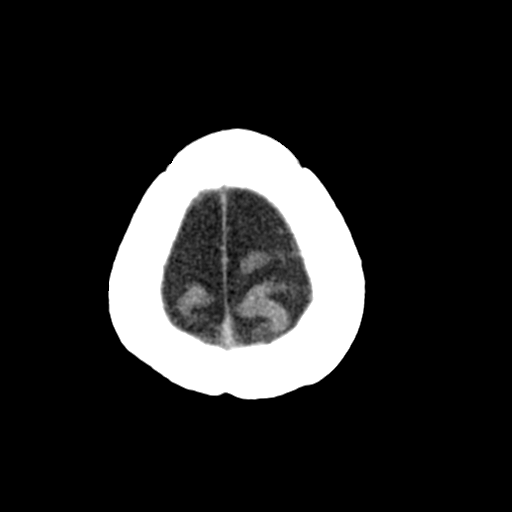
[im 28/30  brain]
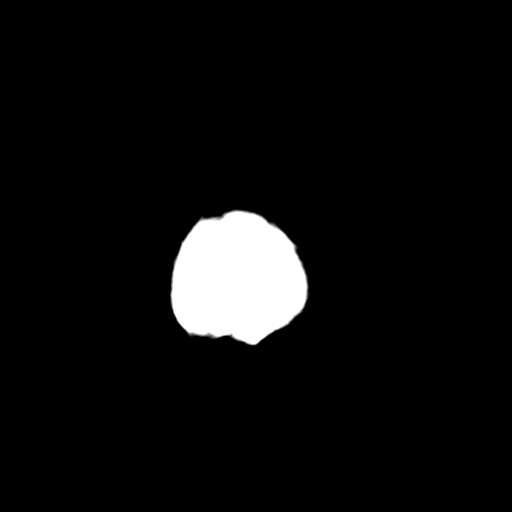
[im 28/30  bone]
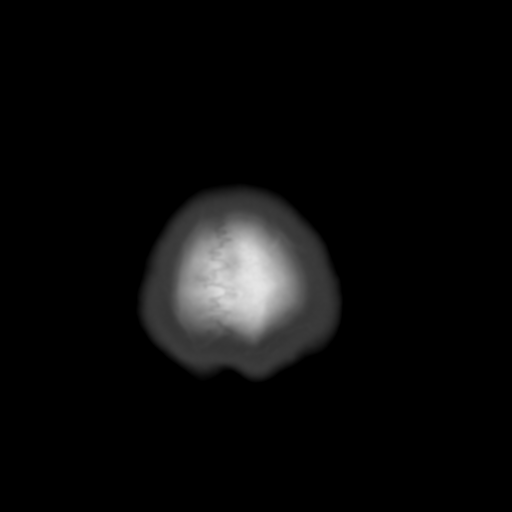

[Series 4: coronal soft · coronal · 0.30mm/px · 3 of 66 slices shown]
[im 22/66  brain]
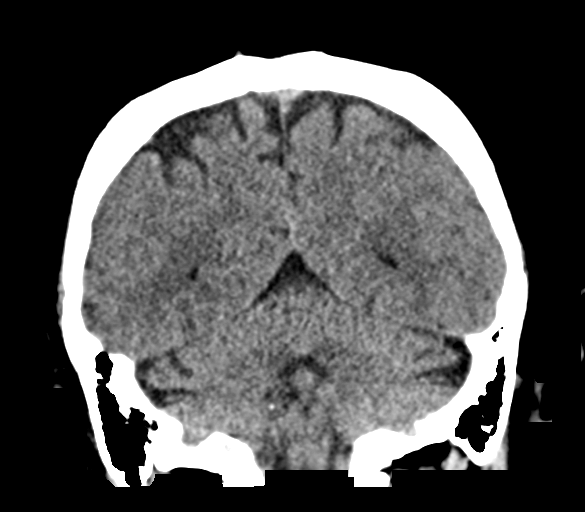
[im 29/66  brain]
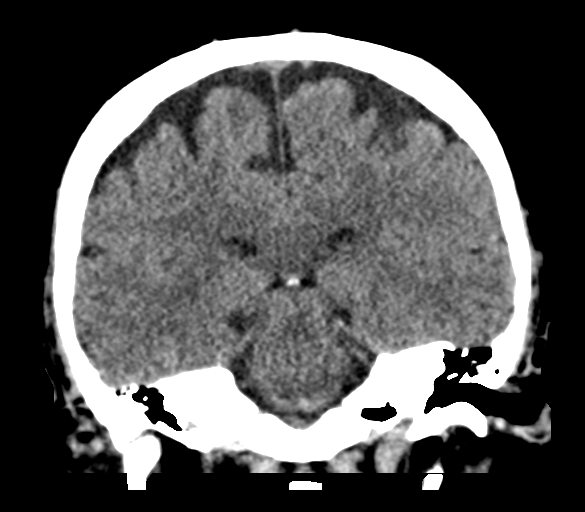
[im 37/66  brain]
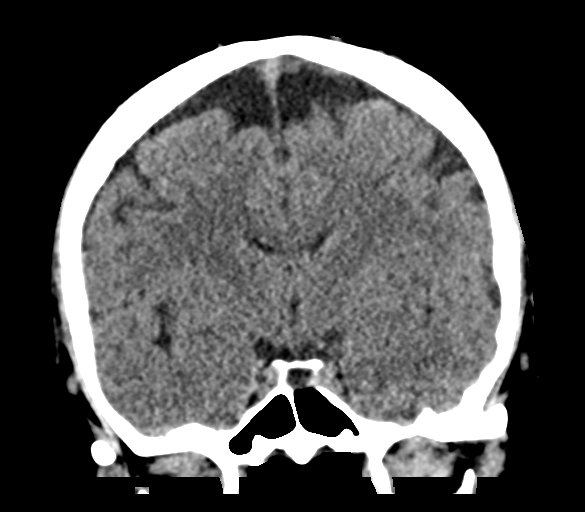

[Series 5: sag soft · sagittal · 0.30mm/px · 3 of 57 slices shown]
[im 19/57  brain]
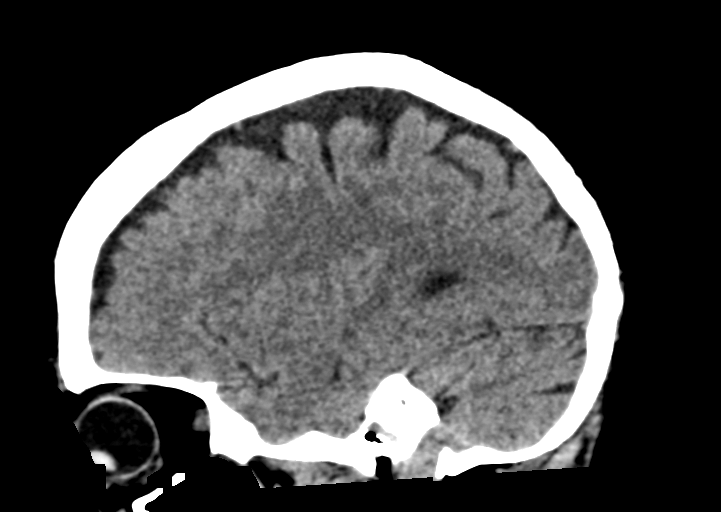
[im 29/57  brain]
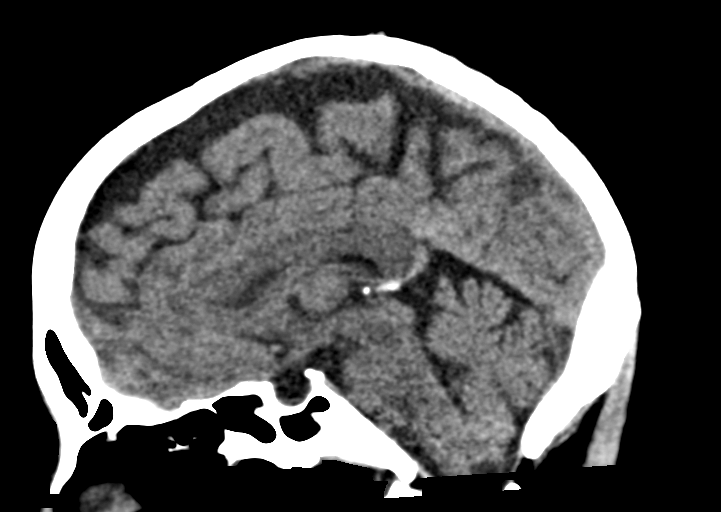
[im 38/57  brain]
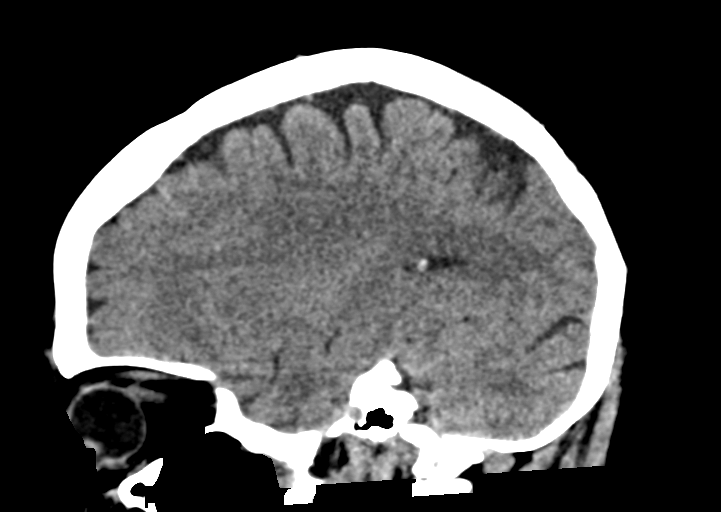

[15 of 47 positions shown; findings below may reference images not displayed]

FINDINGS: Brain: No evidence of acute infarction, hemorrhage, hydrocephalus,
extra-axial collection or mass lesion/mass effect.

Vascular: No hyperdense vessel or unexpected calcification.

Skull: Normal. Negative for fracture or focal lesion.

Sinuses/Orbits: Globes and orbits are unremarkable. Visualized
sinuses and mastoid air cells are clear.

Other: None.
IMPRESSION: Normal unenhanced CT scan of the brain.

## 2020-01-12 ENCOUNTER — Other Ambulatory Visit: Payer: Self-pay

## 2020-01-12 ENCOUNTER — Encounter (HOSPITAL_BASED_OUTPATIENT_CLINIC_OR_DEPARTMENT_OTHER): Payer: Self-pay | Admitting: Emergency Medicine

## 2020-01-12 ENCOUNTER — Emergency Department (HOSPITAL_BASED_OUTPATIENT_CLINIC_OR_DEPARTMENT_OTHER): Payer: Medicare Other

## 2020-01-12 ENCOUNTER — Inpatient Hospital Stay (HOSPITAL_BASED_OUTPATIENT_CLINIC_OR_DEPARTMENT_OTHER)
Admission: EM | Admit: 2020-01-12 | Discharge: 2020-01-15 | DRG: 815 | Disposition: A | Discharge status: Home / Self Care | Payer: Medicare Other | Attending: Internal Medicine | Admitting: Internal Medicine

## 2020-01-12 DIAGNOSIS — Z6829 Body mass index (BMI) 29.0-29.9, adult: Secondary | ICD-10-CM

## 2020-01-12 DIAGNOSIS — E785 Hyperlipidemia, unspecified: Secondary | ICD-10-CM | POA: Diagnosis present

## 2020-01-12 DIAGNOSIS — E669 Obesity, unspecified: Secondary | ICD-10-CM | POA: Diagnosis present

## 2020-01-12 DIAGNOSIS — Z888 Allergy status to other drugs, medicaments and biological substances status: Secondary | ICD-10-CM

## 2020-01-12 DIAGNOSIS — R7301 Impaired fasting glucose: Secondary | ICD-10-CM | POA: Diagnosis present

## 2020-01-12 DIAGNOSIS — Z8249 Family history of ischemic heart disease and other diseases of the circulatory system: Secondary | ICD-10-CM

## 2020-01-12 DIAGNOSIS — I749 Embolism and thrombosis of unspecified artery: Secondary | ICD-10-CM | POA: Diagnosis not present

## 2020-01-12 DIAGNOSIS — I340 Nonrheumatic mitral (valve) insufficiency: Secondary | ICD-10-CM | POA: Diagnosis not present

## 2020-01-12 DIAGNOSIS — I671 Cerebral aneurysm, nonruptured: Secondary | ICD-10-CM | POA: Diagnosis present

## 2020-01-12 DIAGNOSIS — E538 Deficiency of other specified B group vitamins: Secondary | ICD-10-CM | POA: Diagnosis present

## 2020-01-12 DIAGNOSIS — D735 Infarction of spleen: Secondary | ICD-10-CM | POA: Diagnosis present

## 2020-01-12 DIAGNOSIS — R1012 Left upper quadrant pain: Secondary | ICD-10-CM | POA: Diagnosis present

## 2020-01-12 DIAGNOSIS — R7309 Other abnormal glucose: Secondary | ICD-10-CM | POA: Diagnosis not present

## 2020-01-12 DIAGNOSIS — Z823 Family history of stroke: Secondary | ICD-10-CM | POA: Diagnosis not present

## 2020-01-12 DIAGNOSIS — Z8673 Personal history of transient ischemic attack (TIA), and cerebral infarction without residual deficits: Secondary | ICD-10-CM | POA: Diagnosis not present

## 2020-01-12 DIAGNOSIS — K219 Gastro-esophageal reflux disease without esophagitis: Secondary | ICD-10-CM | POA: Diagnosis present

## 2020-01-12 DIAGNOSIS — Z90710 Acquired absence of both cervix and uterus: Secondary | ICD-10-CM

## 2020-01-12 DIAGNOSIS — I10 Essential (primary) hypertension: Secondary | ICD-10-CM | POA: Diagnosis present

## 2020-01-12 DIAGNOSIS — Z880 Allergy status to penicillin: Secondary | ICD-10-CM | POA: Diagnosis not present

## 2020-01-12 DIAGNOSIS — E7800 Pure hypercholesterolemia, unspecified: Secondary | ICD-10-CM | POA: Diagnosis not present

## 2020-01-12 DIAGNOSIS — I4891 Unspecified atrial fibrillation: Secondary | ICD-10-CM | POA: Diagnosis present

## 2020-01-12 DIAGNOSIS — Z79899 Other long term (current) drug therapy: Secondary | ICD-10-CM

## 2020-01-12 DIAGNOSIS — I4819 Other persistent atrial fibrillation: Secondary | ICD-10-CM | POA: Diagnosis present

## 2020-01-12 DIAGNOSIS — K589 Irritable bowel syndrome without diarrhea: Secondary | ICD-10-CM | POA: Diagnosis present

## 2020-01-12 DIAGNOSIS — M8580 Other specified disorders of bone density and structure, unspecified site: Secondary | ICD-10-CM | POA: Diagnosis present

## 2020-01-12 DIAGNOSIS — Z8679 Personal history of other diseases of the circulatory system: Secondary | ICD-10-CM

## 2020-01-12 DIAGNOSIS — E039 Hypothyroidism, unspecified: Secondary | ICD-10-CM | POA: Diagnosis present

## 2020-01-12 DIAGNOSIS — I361 Nonrheumatic tricuspid (valve) insufficiency: Secondary | ICD-10-CM | POA: Diagnosis not present

## 2020-01-12 DIAGNOSIS — Z20822 Contact with and (suspected) exposure to covid-19: Secondary | ICD-10-CM | POA: Diagnosis present

## 2020-01-12 DIAGNOSIS — Z7982 Long term (current) use of aspirin: Secondary | ICD-10-CM

## 2020-01-12 HISTORY — DX: Lyme disease, unspecified: A69.20

## 2020-01-12 HISTORY — DX: Supraventricular tachycardia, unspecified: I47.10

## 2020-01-12 HISTORY — DX: Pure hypercholesterolemia, unspecified: E78.00

## 2020-01-12 HISTORY — DX: Supraventricular tachycardia: I47.1

## 2020-01-12 LAB — COMPREHENSIVE METABOLIC PANEL
ALT: 20 U/L (ref 0–44)
AST: 23 U/L (ref 15–41)
Albumin: 4.1 g/dL (ref 3.5–5.0)
Alkaline Phosphatase: 66 U/L (ref 38–126)
Anion gap: 13 (ref 5–15)
BUN: 20 mg/dL (ref 8–23)
CO2: 23 mmol/L (ref 22–32)
Calcium: 9.1 mg/dL (ref 8.9–10.3)
Chloride: 99 mmol/L (ref 98–111)
Creatinine, Ser: 0.64 mg/dL (ref 0.44–1.00)
GFR calc Af Amer: >60 mL/min (ref >60)
GFR calc non Af Amer: >60 mL/min (ref >60)
Glucose, Bld: 130 mg/dL — ABNORMAL HIGH (ref 70–99)
Potassium: 3.7 mmol/L (ref 3.5–5.1)
Sodium: 135 mmol/L (ref 135–145)
Total Bilirubin: 0.8 mg/dL (ref 0.3–1.2)
Total Protein: 6.8 g/dL (ref 6.5–8.1)

## 2020-01-12 LAB — URINALYSIS, ROUTINE W REFLEX MICROSCOPIC
Bilirubin Urine: NEGATIVE
Glucose, UA: NEGATIVE mg/dL
Hgb urine dipstick: NEGATIVE
Ketones, ur: NEGATIVE mg/dL
Leukocytes,Ua: NEGATIVE
Nitrite: NEGATIVE
Protein, ur: NEGATIVE mg/dL
Specific Gravity, Urine: 1.02 (ref 1.005–1.030)
pH: 7.5 (ref 5.0–8.0)

## 2020-01-12 LAB — CBC WITH DIFFERENTIAL/PLATELET
Abs Immature Granulocytes: 0.05 10*3/uL (ref 0.00–0.07)
Basophils Absolute: 0 10*3/uL (ref 0.0–0.1)
Basophils Relative: 0 %
Eosinophils Absolute: 0 10*3/uL (ref 0.0–0.5)
Eosinophils Relative: 0 %
HCT: 37.8 % (ref 36.0–46.0)
Hemoglobin: 12.3 g/dL (ref 12.0–15.0)
Immature Granulocytes: 0 %
Lymphocytes Relative: 11 %
Lymphs Abs: 1.4 10*3/uL (ref 0.7–4.0)
MCH: 28.7 pg (ref 26.0–34.0)
MCHC: 32.5 g/dL (ref 30.0–36.0)
MCV: 88.1 fL (ref 80.0–100.0)
Monocytes Absolute: 0.4 10*3/uL (ref 0.1–1.0)
Monocytes Relative: 3 %
Neutro Abs: 10.7 10*3/uL — ABNORMAL HIGH (ref 1.7–7.7)
Neutrophils Relative %: 86 %
Platelets: 274 10*3/uL (ref 150–400)
RBC: 4.29 MIL/uL (ref 3.87–5.11)
RDW: 12.8 % (ref 11.5–15.5)
WBC: 12.7 10*3/uL — ABNORMAL HIGH (ref 4.0–10.5)
nRBC: 0 % (ref 0.0–0.2)

## 2020-01-12 LAB — TSH: TSH: 1.375 u[IU]/mL (ref 0.350–4.500)

## 2020-01-12 LAB — HEPARIN LEVEL (UNFRACTIONATED)
Heparin Unfractionated: 0.35 IU/mL (ref 0.30–0.70)
Heparin Unfractionated: 0.25 IU/mL — ABNORMAL LOW (ref 0.30–0.70)

## 2020-01-12 LAB — PHOSPHORUS: Phosphorus: 3.4 mg/dL (ref 2.5–4.6)

## 2020-01-12 LAB — LIPASE, BLOOD: Lipase: 25 U/L (ref 11–51)

## 2020-01-12 LAB — MAGNESIUM: Magnesium: 2 mg/dL (ref 1.7–2.4)

## 2020-01-12 LAB — SARS CORONAVIRUS 2 (TAT 6-24 HRS): SARS Coronavirus 2: NEGATIVE

## 2020-01-12 MED ORDER — HYDROMORPHONE HCL 1 MG/ML IJ SOLN: 0.5000 mg | INTRAMUSCULAR | Status: DC | PRN

## 2020-01-12 MED ORDER — FENTANYL CITRATE (PF) 100 MCG/2ML IJ SOLN
50.0000 ug | Freq: Once | INTRAMUSCULAR | Status: AC
  Administered 2020-01-12: 50 ug via INTRAVENOUS
  Filled 2020-01-12: qty 2

## 2020-01-12 MED ORDER — LOSARTAN POTASSIUM 50 MG PO TABS
50.0000 mg | ORAL_TABLET | Freq: Two times a day (BID) | ORAL | Status: DC
  Administered 2020-01-12 – 2020-01-15 (×7): 50 mg via ORAL
  Filled 2020-01-12 (×6): qty 1
  Filled 2020-01-12: qty 2

## 2020-01-12 MED ORDER — ONDANSETRON HCL 4 MG PO TABS: 4.0000 mg | ORAL_TABLET | Freq: Four times a day (QID) | ORAL | Status: DC | PRN

## 2020-01-12 MED ORDER — SODIUM CHLORIDE 0.45 % IV SOLN: INTRAVENOUS | Status: AC

## 2020-01-12 MED ORDER — MORPHINE SULFATE (PF) 4 MG/ML IV SOLN
4.0000 mg | Freq: Once | INTRAVENOUS | Status: AC
  Administered 2020-01-12: 4 mg via INTRAVENOUS
  Filled 2020-01-12: qty 1

## 2020-01-12 MED ORDER — HEPARIN BOLUS VIA INFUSION
4000.0000 [IU] | Freq: Once | INTRAVENOUS | Status: AC
  Administered 2020-01-12: 4000 [IU] via INTRAVENOUS

## 2020-01-12 MED ORDER — ACETAMINOPHEN 325 MG PO TABS
650.0000 mg | ORAL_TABLET | ORAL | Status: DC | PRN
  Administered 2020-01-13: 650 mg via ORAL
  Filled 2020-01-12: qty 2

## 2020-01-12 MED ORDER — ASPIRIN EC 81 MG PO TBEC
81.0000 mg | DELAYED_RELEASE_TABLET | Freq: Every day | ORAL | Status: DC
  Administered 2020-01-12 – 2020-01-14 (×3): 81 mg via ORAL
  Filled 2020-01-12 (×3): qty 1

## 2020-01-12 MED ORDER — PANTOPRAZOLE SODIUM 40 MG PO TBEC
40.0000 mg | DELAYED_RELEASE_TABLET | Freq: Every day | ORAL | Status: DC
  Administered 2020-01-12 – 2020-01-15 (×4): 40 mg via ORAL
  Filled 2020-01-12 (×4): qty 1

## 2020-01-12 MED ORDER — NEBIVOLOL HCL 2.5 MG PO TABS
2.5000 mg | ORAL_TABLET | Freq: Two times a day (BID) | ORAL | Status: DC
  Administered 2020-01-12 – 2020-01-14 (×4): 2.5 mg via ORAL
  Filled 2020-01-12 (×7): qty 1

## 2020-01-12 MED ORDER — ONDANSETRON HCL 4 MG/2ML IJ SOLN
INTRAMUSCULAR | Status: AC
  Filled 2020-01-12: qty 2

## 2020-01-12 MED ORDER — ONDANSETRON HCL 4 MG/2ML IJ SOLN: 4.0000 mg | Freq: Four times a day (QID) | INTRAMUSCULAR | Status: DC | PRN

## 2020-01-12 MED ORDER — ACETAMINOPHEN 325 MG PO TABS: 650.0000 mg | ORAL_TABLET | Freq: Four times a day (QID) | ORAL | Status: DC | PRN

## 2020-01-12 MED ORDER — ONDANSETRON HCL 4 MG/2ML IJ SOLN
4.0000 mg | Freq: Once | INTRAMUSCULAR | Status: AC
  Administered 2020-01-12: 4 mg via INTRAVENOUS

## 2020-01-12 MED ORDER — ATORVASTATIN CALCIUM 10 MG PO TABS
20.0000 mg | ORAL_TABLET | Freq: Every day | ORAL | Status: DC
  Administered 2020-01-12 – 2020-01-14 (×3): 20 mg via ORAL
  Filled 2020-01-12 (×3): qty 2

## 2020-01-12 MED ORDER — ACETAMINOPHEN 650 MG RE SUPP: 650.0000 mg | Freq: Four times a day (QID) | RECTAL | Status: DC | PRN

## 2020-01-12 MED ORDER — IOHEXOL 300 MG/ML  SOLN
100.0000 mL | Freq: Once | INTRAMUSCULAR | Status: AC | PRN
  Administered 2020-01-12: 100 mL via INTRAVENOUS

## 2020-01-12 MED ORDER — HEPARIN (PORCINE) 25000 UT/250ML-% IV SOLN
1300.0000 [IU]/h | INTRAVENOUS | Status: DC
  Administered 2020-01-12: 1200 [IU]/h via INTRAVENOUS
  Administered 2020-01-13 (×2): 1300 [IU]/h via INTRAVENOUS
  Filled 2020-01-12 (×3): qty 250

## 2020-01-12 MED ORDER — HYDROCHLOROTHIAZIDE 25 MG PO TABS
12.5000 mg | ORAL_TABLET | Freq: Every day | ORAL | Status: DC
  Administered 2020-01-12 – 2020-01-15 (×4): 12.5 mg via ORAL
  Filled 2020-01-12 (×4): qty 1

## 2020-01-12 NOTE — ED Notes (Signed)
Missed floor Rn's return call, attempted to call report again, no answer.

## 2020-01-12 NOTE — ED Notes (Signed)
Pt husband Windy Fast updated with room number and phone number at [facility].

## 2020-01-12 NOTE — ED Notes (Signed)
Pt vomited small amount of yellow emesis after morphine. MD aware.

## 2020-01-12 NOTE — ED Provider Notes (Signed)
[facility] EMERGENCY DEPARTMENT Provider Note   CSN: [id number] Arrival date & time: 01/12/20  0435   History Chief Complaint  Patient presents with  . Abdominal Pain    Allison Richardson is a 74 y.o. female.  The history is provided by the patient.  Abdominal Pain She has history of hypertension, hyperlipidemia, TIA, cerebral aneurysm and comes in complaining of pain in the left upper abdomen which started about 7:30 PM.  Pain radiates to the left side of the back.  Pain is entirely left-sided.  Nothing makes it better, nothing makes it worse.  She tried taking a hot bath without any benefit.  She also tried taking aspirin without any benefit.  There is no associated nausea or vomiting and she denies any fever or chills.  She denies any urinary difficulty.  She has not had any pain like this before.  She does state that there is one spot on her abdomen which is always tender and is related to a history of irritable bowel syndrome.  Past Medical History:  Diagnosis Date  . GERD (gastroesophageal reflux disease)   . Hypercholesteremia   . Hyperlipidemia 12/05/2017  . Hypertension   . Hypothyroid   . Lyme disease   . Osteopenia after menopause 05/14/2018   DEXA 04/2018, [facility]: Impression- Osteopenia   T Scores  R Femur Neck- -0.70 L Femur Neck- -1.20 R Total Femur- -0.40 L Total Femur- -1.50 AP Total Spine: -0.30   . SVT (supraventricular tachycardia) (HCC)   . TIA (transient ischemic attack) 12/05/2017  . TIA (transient ischemic attack)     Patient Active Problem List   Diagnosis Date Noted  . Osteopenia after menopause 05/14/2018  . History of migraine headaches 04/01/2018  . Vitamin B 12 deficiency 02/10/2018  . Nonintractable episodic headache 01/27/2018  . 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 TIA (transient ischemic attack) 12/05/2017  . Essential hypertension 12/05/2017  . GERD (gastroesophageal reflux disease) 11/16/2017  . IBS (irritable bowel  syndrome) 11/16/2017  . Hyperlipidemia 10/15/2017  . Paroxysmal SVT (supraventricular tachycardia) (HCC) 07/07/2016  . Impaired fasting glucose 06/03/2016    Past Surgical History:  Procedure Laterality Date  . ABDOMINAL HYSTERECTOMY    . APPENDECTOMY    . BUNIONECTOMY Left   . IR RADIOLOGIST EVAL & MGMT  01/05/2018     OB History   No obstetric history on file.     Family History  Problem Relation Age of Onset  . Stroke Mother   . High blood pressure Mother   . High Cholesterol Mother   . Stroke Father   . High blood pressure Father   . High Cholesterol Father   . Cancer Father   . Heart attack Maternal Grandfather   . Heart disease Maternal Grandfather   . Stroke Maternal Grandfather   . Early death Paternal Grandmother   . Heart attack Paternal Grandmother   . Heart disease Paternal Grandmother   . Heart attack Paternal Grandfather   . Heart disease Paternal Grandfather     Social History   Tobacco Use  . Smoking status: Never Smoker  . Smokeless tobacco: Never Used  Substance Use Topics  . Alcohol use: No  . Drug use: No    Home Medications Prior to Admission medications   Medication Sig Start Date End Date Taking? Authorizing Provider  acetaminophen (TYLENOL) 500 MG tablet Take 500 mg by mouth every 6 (six) hours as needed.    [provider]  aspirin EC 81  MG tablet Take 81 mg by mouth daily.    [provider]  atorvastatin (LIPITOR) 20 MG tablet Take 1 tablet (20 mg total) by mouth at bedtime. 04/26/18   Willow Ora, MD  cyanocobalamin (,VITAMIN B-12,) 1000 MCG/ML injection Inject into the muscle. Use as directed 07/12/19   [provider]  hydrochlorothiazide (HYDRODIURIL) 12.5 MG tablet Take 1 tablet (12.5 mg total) by mouth daily. 10/01/18   Willow Ora, MD  losartan (COZAAR) 50 MG tablet Take 1 tablet (50 mg total) by mouth 2 (two) times daily. 08/15/19   Lewayne Bunting, MD  nebivolol (BYSTOLIC) 5 MG tablet Take  2.5 mg by mouth 2 (two) times daily. 02/21/19   [provider]  omeprazole (PRILOSEC) 40 MG capsule Take 1 capsule (40 mg total) by mouth daily. 09/27/18   Willow Ora, MD  Probiotic Product (PRO-BIOTIC BLEND PO) Take 1 tablet by mouth daily.    [provider]  Wheat Dextrin (BENEFIBER DRINK MIX PO) Take by mouth.    [provider]    Allergies    Other, Penicillins, Simvastatin, and Sumatriptan succinate  Review of Systems   Review of Systems  Gastrointestinal: Positive for abdominal pain.  All other systems reviewed and are negative.   Physical Exam Updated Vital Signs BP (!) 144/81 (BP Location: Left Arm)   Pulse 73   Temp 97.9 F (36.6 C) (Oral)   Resp 18   Ht 5\' 6"  (1.676 m)   Wt 81.6 kg   SpO2 100%   BMI 29.05 kg/m   Physical Exam Vitals and nursing note reviewed.   74 year old female, resting comfortably and in no acute distress. Vital signs are significant for borderline elevated blood pressure. Oxygen saturation is 100%, which is normal. Head is normocephalic and atraumatic. PERRLA, EOMI. Oropharynx is clear. Neck is nontender and supple without adenopathy or JVD. Back is nontender and there is no CVA tenderness. Lungs are clear without rales, wheezes, or rhonchi. Chest is nontender. Heart has regular rate and rhythm without murmur. Abdomen is soft, flat, with fairly well localized tenderness in the left mid abdomen.  There is no rebound or guarding.  There are no masses or hepatosplenomegaly and peristalsis is hypoactive.  No rashes seen. Extremities have no cyanosis or edema, full range of motion is present. Skin is warm and dry without rash. Neurologic: Mental status is normal, cranial nerves are intact, there are no motor or sensory deficits.  ED Results / Procedures / Treatments   Labs (all labs ordered are listed, but only abnormal results are displayed) Labs Reviewed  COMPREHENSIVE METABOLIC PANEL - Abnormal; Notable for  the following components:      Result Value   Glucose, Bld 130 (*)    All other components within normal limits  CBC WITH DIFFERENTIAL/PLATELET - Abnormal; Notable for the following components:   WBC 12.7 (*)    Neutro Abs 10.7 (*)    All other components within normal limits  SARS CORONAVIRUS 2 (TAT 6-24 HRS)  URINALYSIS, ROUTINE W REFLEX MICROSCOPIC  LIPASE, BLOOD   Radiology CT ABDOMEN PELVIS W CONTRAST  Result Date: 01/12/2020 CLINICAL DATA:  Diverticulitis suspected. Left flank pain since last night. EXAM: CT ABDOMEN AND PELVIS WITH CONTRAST TECHNIQUE: Multidetector CT imaging of the abdomen and pelvis was performed using the standard protocol following bolus administration of intravenous contrast. CONTRAST:  03/13/2020 OMNIPAQUE IOHEXOL 300 MG/ML  SOLN COMPARISON:  None. FINDINGS: Lower chest: Moderate sliding hiatal hernia.  Mild scarring or atelectasis in the left lower lobe. Visible left ventricle and left atrium show no filling defects. Hepatobiliary: No focal liver abnormality.Single calcified gallstone. Gallbladder is full but not over distended or inflamed appearing Pancreas: Unremarkable. Spleen: There is patchy geographic non enhancement within the spleen, involving nearly 50%. No perisplenic hematoma or evidence of underlying lesion. Adrenals/Urinary Tract: Negative adrenals. No hydronephrosis or stone. Unremarkable bladder. Stomach/Bowel: No obstruction. No appendicitis. Multiple left colonic diverticula. Vascular/Lymphatic: No acute vascular abnormality. Atheromatous changes are mild for age. There is calcified plaque at the celiac origin but enhancement of the splenic artery and vein is unremarkable for contrast timing. No mass or adenopathy. Reproductive:Hysterectomy Other: No ascites or pneumoperitoneum. Musculoskeletal: No acute abnormalities. Thoracic spondylosis with endplate sclerosis. 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 IMPRESSION: 1. Patchy splenic infarction. No  infarct seen in the other viscera and no clear embolic source. 2. Cholelithiasis and colonic diverticulosis. 3. Moderate sliding hiatal hernia. Electronically Signed   By: Monte Fantasia M.D.   On: 01/12/2020 06:43    Procedures Procedures  Medications Ordered in ED Medications  morphine 4 MG/ML injection 4 mg (has no administration in time range)    ED Course  I have reviewed the triage vital signs and the nursing notes.  Pertinent labs & imaging results that were available during my care of the patient were reviewed by me and considered in my medical decision making (see chart for details).  MDM Rules/Calculators/A&P Left upper quadrant pain of uncertain cause.  Possibilities include diverticulitis, urolithiasis, abdominal aortic aneurysm, gastritis.  Will check screening labs and send for CT of abdomen and pelvis.  Old records are reviewed, and she has no relevant past visits, no prior abdominal imaging.  She had good relief of pain with morphine, but then developed nausea which required ondansetron.  Labs show slightly elevated glucose of 130 and mild leukocytosis.  CT of abdomen and pelvis shows patchy infarction of the spleen without any clear embolic source, no other infarction seen.  Incidental findings of cholelithiasis and diverticulosis without diverticulitis.  Further review of her prior records shows most recent echocardiogram was in 2019.  She will be admitted for pain control and work-up to evaluate for possible embolic source for infarction.  Case is signed out to Dr. Ronnald Nian..  Final Clinical Impression(s) / ED Diagnoses Final diagnoses:  Splenic infarction    Rx / DC Orders ED Discharge Orders    None       Delora Fuel, MD 85/46/27 [id number]

## 2020-01-12 NOTE — H&P (Signed)
History and Physical    See Beharry MVH:[id number] DOB: Nov 10, 1945 DOA: 01/12/2020  PCP: Javier Glazier, MD  Patient coming from: [place] have personally briefly reviewed patient's old medical records in [place]  Chief Complaint: Left upper quadrant pain  HPI: Allison Richardson is a 74 y.o. female with medical history significant of hypertension, hyperlipidemia, GERD, TIA presents to [facility] ER due to severe left quadrant abdominal pain which started last night at 7:30 PM  Patient tells me that last night at 7:30 PM she started having left upper quadrant abdominal pain, 8 out of 10, radiates to left side of back, denies association with nausea, vomiting, diarrhea, constipation, fever, chills, melena, chest pain, shortness of breath, palpitation, leg swelling, headache or blurry vision.  No previous history of A. fib or CHF.  No history of smoking, alcohol use.  In ED: Patient's vital signs stable, she is afebrile with leukocytosis of 12.7, lipase: WNL, COVID-19 negative.  EKG shows A. fib, CT abdomen/pelvis shows patchy infarction of the spleen.  Patient started on IV heparin for the concern of cardioembolic phenomena.  Cardiology consulted for new onset A. fib.  Patient transferred to [facility] for further evaluation and management.   Review of Systems: As per HPI otherwise negative.    Past Medical History:  Diagnosis Date  . GERD (gastroesophageal reflux disease)   . Hypercholesteremia   . Hyperlipidemia 12/05/2017  . Hypertension   . Hypothyroid   . Lyme disease   . Osteopenia after menopause 05/14/2018   DEXA 04/2018, [facility]: Impression- Osteopenia   T Scores  R Femur Neck- -0.70 L Femur Neck- -1.20 R Total Femur- -0.40 L Total Femur- -1.50 AP Total Spine: -0.30   . SVT (supraventricular tachycardia) ([place])   . TIA (transient ischemic attack) 12/05/2017  . TIA (transient ischemic attack)     Past Surgical History:  Procedure Laterality  Date  . ABDOMINAL HYSTERECTOMY    . APPENDECTOMY    . BUNIONECTOMY Left   . IR RADIOLOGIST EVAL & MGMT  01/05/2018     reports that she has never smoked. She has never used smokeless tobacco. She reports that she does not drink alcohol or use drugs.  Allergies  Allergen Reactions  . Other     Has had adverse reaction to a migraine medication  . Penicillins Itching  . Simvastatin Other (See Comments)  . Sumatriptan Succinate Other (See Comments)    Hypotension Hypotension    Family History  Problem Relation Age of Onset  . Stroke Mother   . High blood pressure Mother   . High Cholesterol Mother   . Stroke Father   . High blood pressure Father   . High Cholesterol Father   . Cancer Father   . Heart attack Maternal Grandfather   . Heart disease Maternal Grandfather   . Stroke Maternal Grandfather   . Early death Paternal Grandmother   . Heart attack Paternal Grandmother   . Heart disease Paternal Grandmother   . Heart attack Paternal Grandfather   . Heart disease Paternal Grandfather     Prior to Admission medications   Medication Sig Start Date End Date Taking? Authorizing Provider  acetaminophen (TYLENOL) 500 MG tablet Take 500 mg by mouth every 6 (six) hours as needed.    [provider]  aspirin EC 81 MG tablet Take 81 mg by mouth daily.    [provider]  atorvastatin (LIPITOR) 20 MG tablet Take  1 tablet (20 mg total) by mouth at bedtime. 04/26/18   Willow Ora, MD  cyanocobalamin (,VITAMIN B-12,) 1000 MCG/ML injection Inject into the muscle. Use as directed 07/12/19   [provider]  hydrochlorothiazide (HYDRODIURIL) 12.5 MG tablet Take 1 tablet (12.5 mg total) by mouth daily. 10/01/18   Willow Ora, MD  losartan (COZAAR) 50 MG tablet Take 1 tablet (50 mg total) by mouth 2 (two) times daily. 08/15/19   Lewayne Bunting, MD  nebivolol (BYSTOLIC) 5 MG tablet Take 2.5 mg by mouth 2 (two) times daily. 02/21/19   [provider]    omeprazole (PRILOSEC) 40 MG capsule Take 1 capsule (40 mg total) by mouth daily. 09/27/18   Willow Ora, MD  Probiotic Product (PRO-BIOTIC BLEND PO) Take 1 tablet by mouth daily.    [provider]  Wheat Dextrin (BENEFIBER DRINK MIX PO) Take by mouth.    [provider]    Physical Exam: Vitals:   01/12/20 1132 01/12/20 1200 01/12/20 1248 01/12/20 1349  BP: 127/76 134/76 134/76 134/76  Pulse: 70 70 74 70  Resp: 15 18 16 15   Temp:      TempSrc:      SpO2: 96% 97% 99% 99%  Weight:      Height:        Constitutional: NAD, calm, comfortable, communicating well Eyes: PERRL, lids and conjunctivae normal ENMT: Mucous membranes are moist. Posterior pharynx clear of any exudate or lesions.Normal dentition.  Neck: normal, supple, no masses, no thyromegaly Respiratory: clear to auscultation bilaterally, no wheezing, no crackles. Normal respiratory effort. No accessory muscle use.  Cardiovascular: Regular rate and rhythm, no murmurs / rubs / gallops. No extremity edema. 2+ pedal pulses. No carotid bruits.  Abdomen: Left  sided abdominal tenderness positive, no masses palpated. No hepatosplenomegaly. Bowel sounds positive.  Musculoskeletal: no clubbing / cyanosis. No joint deformity upper and lower extremities. Good ROM, no contractures. Normal muscle tone.  Skin: no rashes, lesions, ulcers. No induration Neurologic: CN 2-12 grossly intact. Sensation intact, DTR normal. Strength 5/5 in all 4.  Psychiatric: Normal judgment and insight. Alert and oriented x 3. Normal mood.    Labs on Admission: I have personally reviewed following labs and imaging studies  CBC: Recent Labs  Lab 01/12/20 0507  WBC 12.7*  NEUTROABS 10.7*  HGB 12.3  HCT 37.8  MCV 88.1  PLT 274   Basic Metabolic Panel: Recent Labs  Lab 01/12/20 0507  NA 135  K 3.7  CL 99  CO2 23  GLUCOSE 130*  BUN 20  CREATININE 0.64  CALCIUM 9.1   GFR: Estimated Creatinine Clearance: 66.4 mL/min (by  C-G formula based on SCr of 0.64 mg/dL). Liver Function Tests: Recent Labs  Lab 01/12/20 0507  AST 23  ALT 20  ALKPHOS 66  BILITOT 0.8  PROT 6.8  ALBUMIN 4.1   Recent Labs  Lab 01/12/20 0507  LIPASE 25   No results for input(s): AMMONIA in the last 168 hours. Coagulation Profile: No results for input(s): INR, PROTIME in the last 168 hours. Cardiac Enzymes: No results for input(s): CKTOTAL, CKMB, CKMBINDEX, TROPONINI in the last 168 hours. BNP (last 3 results) No results for input(s): PROBNP in the last 8760 hours. HbA1C: No results for input(s): HGBA1C in the last 72 hours. CBG: No results for input(s): GLUCAP in the last 168 hours. Lipid Profile: No results for input(s): CHOL, HDL, LDLCALC, TRIG, CHOLHDL, LDLDIRECT in the last 72 hours. Thyroid Function Tests: No  results for input(s): TSH, T4TOTAL, FREET4, T3FREE, THYROIDAB in the last 72 hours. Anemia Panel: No results for input(s): VITAMINB12, FOLATE, FERRITIN, TIBC, IRON, RETICCTPCT in the last 72 hours. Urine analysis:    Component Value Date/Time   COLORURINE YELLOW 01/12/2020 0447   APPEARANCEUR CLEAR 01/12/2020 0447   LABSPEC 1.020 01/12/2020 0447   PHURINE 7.5 01/12/2020 0447   GLUCOSEU NEGATIVE 01/12/2020 0447   HGBUR NEGATIVE 01/12/2020 0447   BILIRUBINUR NEGATIVE 01/12/2020 0447   KETONESUR NEGATIVE 01/12/2020 0447   PROTEINUR NEGATIVE 01/12/2020 0447   NITRITE NEGATIVE 01/12/2020 0447   LEUKOCYTESUR NEGATIVE 01/12/2020 0447    Radiological Exams on Admission: CT ABDOMEN PELVIS W CONTRAST  Result Date: 01/12/2020 CLINICAL DATA:  Diverticulitis suspected. Left flank pain since last night. EXAM: CT ABDOMEN AND PELVIS WITH CONTRAST TECHNIQUE: Multidetector CT imaging of the abdomen and pelvis was performed using the standard protocol following bolus administration of intravenous contrast. CONTRAST:  OMNIPAQUE IOHEXOL 300 MG/ML  SOLN COMPARISON:  None. FINDINGS: Lower chest: Moderate sliding hiatal  hernia. Mild scarring or atelectasis in the left lower lobe. Visible left ventricle and left atrium show no filling defects. Hepatobiliary: No focal liver abnormality.Single calcified gallstone. Gallbladder is full but not over distended or inflamed appearing Pancreas: Unremarkable. Spleen: There is patchy geographic non enhancement within the spleen, involving nearly 50%. No perisplenic hematoma or evidence of underlying lesion. Adrenals/Urinary Tract: Negative adrenals. No hydronephrosis or stone. Unremarkable bladder. Stomach/Bowel: No obstruction. No appendicitis. Multiple left colonic diverticula. Vascular/Lymphatic: No acute vascular abnormality. Atheromatous changes are mild for age. There is calcified plaque at the celiac origin but enhancement of the splenic artery and vein is unremarkable for contrast timing. No mass or adenopathy. Reproductive:Hysterectomy Other: No ascites or pneumoperitoneum. Musculoskeletal: No acute abnormalities. Thoracic spondylosis with endplate sclerosis. 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 IMPRESSION: 1. Patchy splenic infarction. No infarct seen in the other viscera and no clear embolic source. 2. Cholelithiasis and colonic diverticulosis. 3. Moderate sliding hiatal hernia. Electronically Signed   By: Marnee Spring M.D.   On: 01/12/2020 06:43    EKG: Independently reviewed.  A. 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  Patient is afebrile with no leukocytosis.  Lipase: WNL.  COVID-19 negative.  UA: Negative.  - EKG shows A. fib.  Reviewed CT  abdomen/pelvis result.  Patient started on IV heparin. -Admit patient on med telemetry bed -Cardiology has been consulted-await recommendation -Continue gentle hydration and continue IV heparin -Dilaudid as needed for pain control.  Zofran as needed for nausea and vomiting.  We will keep her n.p.o. except medicines. -Check TSH and other electrolytes.   -Transthoracic echo is pending  Hypertension: Blood pressure is stable.  Continue HCTZ, losartan, Bystolic  TIA: Continue aspirin and statin  Hyperlipidemia: Continue statin  GERD: Continue PPI  DVT prophylaxis: IV heparin, SCD/TED Code Status: Full code-confirmed with the patient  family Communication: None present at bedside.  Plan of care discussed with patient in length and she verbalized understanding and agreed with it. Disposition Plan: To be determined Consults called: Cardiology Admission status: Inpatient   Ollen Bowl MD [facility] Pager  [phone]  If 7PM-7AM, please contact night-coverage [email or web address] Password [facility]  01/12/2020, 4:29 PM

## 2020-01-12 NOTE — ED Notes (Signed)
Attempted to call report to floor; RN to call back  

## 2020-01-12 NOTE — Consult Note (Addendum)
Cardiology Consultation:   Patient ID: Allison Richardson MRN: [id number]; DOB: 01-Dec-1945  Admit date: 01/12/2020 Date of Consult: 01/12/2020  Primary Care Provider: Nadara Eaton, MD Primary Cardiologist: Jens Som  Primary Electrophysiologist:  None    Patient Profile:   Allison Richardson is a 74 y.o. female with a hx of hypertension, hyperlipidemia, TIA who is being seen today for the evaluation of new onset atrial fibrillation with a splenic infarct at the request of Dr Arlean Hopping.  History of Present Illness:   Allison Richardson is a 74 year old female with a history of hypertension, hyperlipidemia and a TIA in the past.  [facility] room due to left quadrant pain which started last night.  She was found to have an infarct in her spleen.  She was found to have atrial fibrillation.  She denies having any palpitations.  She denies any chest pain or shortness of breath.     Past Medical History:  Diagnosis Date  . GERD (gastroesophageal reflux disease)   . Hypercholesteremia   . Hyperlipidemia 12/05/2017  . Hypertension   . Hypothyroid   . Lyme disease   . Osteopenia after menopause 05/14/2018   DEXA 04/2018, [facility]: Impression- Osteopenia   T Scores  R Femur Neck- -0.70 L Femur Neck- -1.20 R Total Femur- -0.40 L Total Femur- -1.50 AP Total Spine: -0.30   . SVT (supraventricular tachycardia) (HCC)   . TIA (transient ischemic attack) 12/05/2017  . TIA (transient ischemic attack)     Past Surgical History:  Procedure Laterality Date  . ABDOMINAL HYSTERECTOMY    . APPENDECTOMY    . BUNIONECTOMY Left   . IR RADIOLOGIST EVAL & MGMT  01/05/2018     Home Medications:  Prior to Admission medications   Medication Sig Start Date End Date Taking? Authorizing Provider  amLODipine (NORVASC) 2.5 MG tablet Take 2.5 mg by mouth at bedtime. 01/06/20  Yes [provider]  ascorbic acid (VITAMIN C) 500 MG tablet Take 500 mg by mouth daily.   Yes [provider]  aspirin EC 325 MG tablet Take 650 mg by mouth as needed (for pain).   Yes [provider]  aspirin EC 81 MG tablet Take 81 mg by mouth at bedtime.    Yes [provider]  Aspirin-Salicylamide-Caffeine (BC HEADACHE POWDER PO) Take 1 packet by mouth as needed (for pain or headaches).    Yes [provider]  atorvastatin (LIPITOR) 10 MG tablet Take 10 mg by mouth at bedtime.   Yes [provider]  Cholecalciferol (VITAMIN D-3) 25 MCG (1000 UT) CAPS Take 1,000 Units by mouth daily.   Yes [provider]  cyanocobalamin (,VITAMIN B-12,) 1000 MCG/ML injection Inject 1,000 mcg into the muscle every 30 (thirty) days.  07/12/19  Yes [provider]  hydrochlorothiazide (HYDRODIURIL) 12.5 MG tablet Take 1 tablet (12.5 mg total) by mouth daily. 10/01/18  Yes Willow Ora, MD  losartan (COZAAR) 50 MG tablet Take 1 tablet (50 mg total) by mouth 2 (two) times daily. 08/15/19  Yes Lewayne Bunting, MD  nebivolol (BYSTOLIC) 5 MG tablet Take 2.5 mg by mouth 2 (two) times daily. 02/21/19  Yes [provider]  omeprazole (PRILOSEC) 40 MG capsule Take 1 capsule (40 mg total) by mouth daily. Patient taking differently: Take 40 mg by mouth every evening.  09/27/18  Yes Willow Ora, MD  Probiotic Product (DIGESTIVE ADV+BOWEL SUPPORT) CAPS Take 1 capsule by mouth daily.  Yes [provider]  Wheat Dextrin (BENEFIBER DRINK MIX PO) Take by mouth See admin instructions. Mix 1 teaspoonful of powder into 6-8 ounces of coffee and drink in the morning (once a day)   Yes [provider]    Inpatient Medications: Scheduled Meds: . aspirin EC  81 mg Oral Daily  . atorvastatin  20 mg Oral QHS  . hydrochlorothiazide  12.5 mg Oral Daily  . losartan  50 mg Oral BID  . nebivolol  2.5 mg Oral BID  . pantoprazole  40 mg Oral Daily   Continuous Infusions: . sodium chloride 75 mL/hr at 01/12/20 1407  . heparin 1,200 Units/hr (01/12/20 0804)     PRN Meds: acetaminophen **OR** acetaminophen, acetaminophen, HYDROmorphone (DILAUDID) injection, ondansetron (ZOFRAN) IV, ondansetron **OR** ondansetron (ZOFRAN) IV  Allergies:    Allergies  Allergen Reactions  . Adhesive [Tape] Other (See Comments)    Heart monitor leads- CREATED RED WELTS!!  . Penicillins Itching    Did it involve swelling of the face/tongue/throat, SOB, or low BP? Unk Did it involve sudden or severe rash/hives, skin peeling, or any reaction on the inside of your mouth or nose? Unk Did you need to seek medical attention at a hospital or doctor's office? No When did it last happen? Childhood If all above answers are "NO", may proceed with cephalosporin use.   . Simvastatin Itching  . Sumatriptan Succinate Other (See Comments)    Hypotension     Social History:   Social History   Socioeconomic History  . Marital status: Married    Spouse name: Not on file  . Number of children: 0  . Years of education: 18-20  . Highest education level: Not on file  Occupational History  . Occupation: Retired  Tobacco Use  . Smoking status: Never Smoker  . Smokeless tobacco: Never Used  Substance and Sexual Activity  . Alcohol use: No  . Drug use: No  . Sexual activity: Yes  Other Topics Concern  . Not on file  Social History Narrative   Lives with husband   Caffeine use: Coffee daily   Right handed   Social Determinants of Health   Financial Resource Strain:   . Difficulty of Paying Living Expenses:   Food Insecurity:   . Worried About Programme researcher, broadcasting/film/video in the Last Year:   . Barista in the Last Year:   Transportation Needs:   . Freight forwarder (Medical):   Marland Kitchen Lack of Transportation (Non-Medical):   Physical Activity:   . Days of Exercise per Week:   . Minutes of Exercise per Session:   Stress:   . Feeling of Stress :   Social Connections:   . Frequency of Communication with Friends and Family:   . Frequency of Social Gatherings with  Friends and Family:   . Attends Religious Services:   . Active Member of Clubs or Organizations:   . Attends Banker Meetings:   Marland Kitchen Marital Status:   Intimate Partner Violence:   . Fear of Current or Ex-Partner:   . Emotionally Abused:   Marland Kitchen Physically Abused:   . Sexually Abused:     Family History:    Family History  Problem Relation Age of Onset  . Stroke Mother   . High blood pressure Mother   . High Cholesterol Mother   . Stroke Father   . High blood pressure Father   . High Cholesterol Father   . Cancer Father   .  Heart attack Maternal Grandfather   . Heart disease Maternal Grandfather   . Stroke Maternal Grandfather   . Early death Paternal Grandmother   . Heart attack Paternal Grandmother   . Heart disease Paternal Grandmother   . Heart attack Paternal Grandfather   . Heart disease Paternal Grandfather      ROS:  Please see the history of present illness.   All other ROS reviewed and negative.     Physical Exam/Data:   Vitals:   01/12/20 1248 01/12/20 1349 01/12/20 1400 01/12/20 1609  BP: 134/76 134/76 (!) 143/85 (!) 143/85  Pulse: 74 70 70 73  Resp: 16 15 19    Temp:   98.4 F (36.9 C) 98.4 F (36.9 C)  TempSrc:   Oral Oral  SpO2: 99% 99% 97% 99%  Weight:   81.6 kg   Height:   5\' 6"  (1.676 m)    No intake or output data in the 24 hours ending 01/12/20 1741 Last 3 Weights 01/12/2020 01/12/2020 08/15/2019  Weight (lbs) 179 lb 14.3 oz 180 lb 177 lb 12.8 oz  Weight (kg) 81.6 kg 81.647 kg 80.65 kg     Body mass index is 29.04 kg/m.  General: Elderly female, mildly obese, no acute distress HEENT: normal Lymph: no adenopathy Neck: no JVD Endocrine:  No thryomegaly Vascular: No carotid bruits; FA pulses 2+ bilaterally without bruits  Cardiac: Irregularly irregular Lungs:  clear to auscultation bilaterally, no wheezing, rhonchi or rales  Abd: soft, nontender, no hepatomegaly  Ext: no edema Musculoskeletal:  No deformities, BUE and BLE strength  normal and equal Skin: warm and dry  Neuro:  CNs 2-12 intact, no focal abnormalities noted Psych:  Normal affect   EKG:  The EKG was personally reviewed and demonstrates: Atrial fibrillation with a controlled ventricular response Telemetry:  Telemetry was personally reviewed and demonstrates: Atrial fibrillation with a controlled ventricular response  Relevant CV Studies:   Laboratory Data:  High Sensitivity Troponin:  No results for input(s): TROPONINIHS in the last 720 hours.   Chemistry Recent Labs  Lab 01/12/20 0507  NA 135  K 3.7  CL 99  CO2 23  GLUCOSE 130*  BUN 20  CREATININE 0.64  CALCIUM 9.1  GFRNONAA >60  GFRAA >60  ANIONGAP 13    Recent Labs  Lab 01/12/20 0507  PROT 6.8  ALBUMIN 4.1  AST 23  ALT 20  ALKPHOS 66  BILITOT 0.8   Hematology Recent Labs  Lab 01/12/20 0507  WBC 12.7*  RBC 4.29  HGB 12.3  HCT 37.8  MCV 88.1  MCH 28.7  MCHC 32.5  RDW 12.8  PLT 274   BNPNo results for input(s): BNP, PROBNP in the last 168 hours.  DDimer No results for input(s): DDIMER in the last 168 hours.   Radiology/Studies:  CT ABDOMEN PELVIS W CONTRAST  Result Date: 01/12/2020 CLINICAL DATA:  Diverticulitis suspected. Left flank pain since last night. EXAM: CT ABDOMEN AND PELVIS WITH CONTRAST TECHNIQUE: Multidetector CT imaging of the abdomen and pelvis was performed using the standard protocol following bolus administration of intravenous contrast. CONTRAST:  162mL OMNIPAQUE IOHEXOL 300 MG/ML  SOLN COMPARISON:  None. FINDINGS: Lower chest: Moderate sliding hiatal hernia. Mild scarring or atelectasis in the left lower lobe. Visible left ventricle and left atrium show no filling defects. Hepatobiliary: No focal liver abnormality.Single calcified gallstone. Gallbladder is full but not over distended or inflamed appearing Pancreas: Unremarkable. Spleen: There is patchy geographic non enhancement within the spleen, involving nearly 50%. No  perisplenic hematoma or evidence  of underlying lesion. Adrenals/Urinary Tract: Negative adrenals. No hydronephrosis or stone. Unremarkable bladder. Stomach/Bowel: No obstruction. No appendicitis. Multiple left colonic diverticula. Vascular/Lymphatic: No acute vascular abnormality. Atheromatous changes are mild for age. There is calcified plaque at the celiac origin but enhancement of the splenic artery and vein is unremarkable for contrast timing. No mass or adenopathy. Reproductive:Hysterectomy Other: No ascites or pneumoperitoneum. Musculoskeletal: No acute abnormalities. Thoracic spondylosis with endplate sclerosis. 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 IMPRESSION: 1. Patchy splenic infarction. No infarct seen in the other viscera and no clear embolic source. 2. Cholelithiasis and colonic diverticulosis. 3. Moderate sliding hiatal hernia. Electronically Signed   By: Marnee Spring M.D.   On: 01/12/2020 06:43   {   Assessment and Plan:   1. Atrial fibrillation: The patient is found to have new onset atrial fibrillation.  She has had palpitations for years but has never had atrial fibrillation diagnosed.  Unfortunately, she has has had a thromboembolus to her spleen.  Hopefully that will heal up nicely.  She is on heparin drip.  We will transition her to Eliquis at some point in the near future.  I am not aware of any specific procedures that she might need but we can hold off the transition for now. We will get an echocardiogram in the morning. We will check a TSH.  2.  Hypertension: Continue current medications for now.  3.  Hyperlipidemia: Continue current medications for now.        For questions or updates, please contact [facility] Please consult [email or web address] for contact info under     Signed, Kristeen Miss, MD  01/12/2020 5:41 PM

## 2020-01-12 NOTE — Progress Notes (Addendum)
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 Other     Has had adverse reaction to a migraine medication  . Penicillins Itching  . Simvastatin Other (See Comments)  . Sumatriptan Succinate Other (See Comments)    Hypotension Hypotension    Patient Measurements: Height: 5\' 6"  (167.6 cm) Weight: 81.6 kg (180 lb) IBW/kg (Calculated) : 59.3  Vital Signs: Temp: 97.9 F (36.6 C) (04/01 0444) Temp Source: Oral (04/01 0444) BP: 134/76 (04/01 1349) Pulse Rate: 70 (04/01 1349)  Labs: Recent Labs    01/12/20 0507 01/12/20 1409  HGB 12.3  --   HCT 37.8  --   PLT 274  --   HEPARINUNFRC  --  0.35  CREATININE 0.64  --     Estimated Creatinine Clearance: 66.4 mL/min (by C-G formula based on SCr of 0.64 mg/dL).   Assessment: 74 yo with abdominal pain that started last night, fairly suddenly.  Pain worse throughout the night.  CT scan of the abdomen and pelvis shows patchy splenic infarction in the setting of atrial fibrillation (new diagnosis). Patient was not on anticoagulation PTA. Pharmacy consulted to start heparin.  Currently on IV heparin at 1200 units/hr. Initial HL therapeutic at 0.35. H/H and Plt wnl. SCr wnl.    Goal of Therapy:  Heparin level 0.3-0.7 units/ml Monitor platelets by anticoagulation protocol: Yes   Plan:  -Continue IV heparin at 1200 units/hr -F/u 8 hr confirmatory heparin level -Monitor daily HL, CBC and s/s of bleeding   66, PharmD., BCPS Clinical Pharmacist Clinical phone for 01/12/20 until 10pm6/1/21 If after 10pm, please refer to AMION for unit-specific pharmacist   Addendum: Heparin level tonight is subtherapeutic at 0.25 on 1200 units/hr. No s/s of bleeding noted per RN. Will increase heparin infusion to 1300 units/hr and f/u AM level   : [id number], PharmD., BCPS Clinical Pharmacist

## 2020-01-12 NOTE — ED Notes (Signed)
Pt husband, Windy Fast, updated with plan

## 2020-01-12 NOTE — ED Triage Notes (Signed)
Pt c/o mid abd pain radiating to left flank area since last night. Denies n/v/d

## 2020-01-12 NOTE — ED Provider Notes (Signed)
Assumed care of patient at 7 AM from Dr. Preston Fleeting.  Patient with abdominal pain that started last night, fairly suddenly.  Pain worse throughout the night.  CT scan of the abdomen and pelvis shows patchy splenic infarction but no obvious clear embolic source.  Lab work was otherwise unremarkable.  Upon my evaluation the patient her pain has improved with IV morphine.  Nausea has improved with Zofran.  Has a history of TIA.  States that she wore a heart monitor in the past but has never been diagnosed with atrial fibrillation to her knowledge.  At that time an EKG was ordered as suspect likely patchy splenic infarct from embolic source likely in the setting of A. fib.  EKG does appear to show a rate controlled atrial fibrillation.  Talked with hospitalist, Dr. Vida Roller and he agrees.  Plan is to start IV heparin given that this is likely embolic in the setting of A. fib which appears new.  Patient to be admitted to hospital service for further care.  Hemodynamically stable throughout my care.  This chart was dictated using voice recognition software.  Despite best efforts to proofread,  errors can occur which can change the documentation meaning.  .Critical Care Performed by: Virgina Norfolk, DO Authorized by: Virgina Norfolk, DO   Critical care provider statement:    Critical care time (minutes):  35   Critical care was necessary to treat or prevent imminent or life-threatening deterioration of the following conditions:  Circulatory failure   Critical care was time spent personally by me on the following activities:  Blood draw for specimens, development of treatment plan with patient or surrogate, discussions with primary provider, evaluation of patient's response to treatment, examination of patient, obtaining history from patient or surrogate, ordering and performing treatments and interventions, ordering and review of laboratory studies, ordering and review of radiographic studies, pulse oximetry,  re-evaluation of patient's condition and review of old charts   I assumed direction of critical care for this patient from another provider in my specialty: no        Virgina Norfolk, DO 01/12/20 [phone]

## 2020-01-12 NOTE — Progress Notes (Signed)
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 Other     Has had adverse reaction to a migraine medication  . Penicillins Itching  . Simvastatin Other (See Comments)  . Sumatriptan Succinate Other (See Comments)    Hypotension Hypotension    Patient Measurements: Height: 5\' 6"  (167.6 cm) Weight: 180 lb (81.6 kg) IBW/kg (Calculated) : 59.3  Vital Signs: Temp: 97.9 F (36.6 C) (04/01 0444) Temp Source: Oral (04/01 0444) BP: 132/84 (04/01 0722) Pulse Rate: 72 (04/01 0721)  Labs: Recent Labs    01/12/20 0507  HGB 12.3  HCT 37.8  PLT 274  CREATININE 0.64    Estimated Creatinine Clearance: 66.4 mL/min (by C-G formula based on SCr of 0.64 mg/dL).   Assessment: 74 yo with abdominal pain that started last night, fairly suddenly.  Pain worse throughout the night.  CT scan of the abdomen and pelvis shows patchy splenic infarction in the setting of atrial fibrillation (new diagnosis). Patient was not on anticoagulation PTA. Pharmacy consulted to start heparin.   Goal of Therapy:  Heparin level 0.3-0.7 units/ml Monitor platelets by anticoagulation protocol: Yes   Plan:  Give 4000 units bolus x 1 Start heparin infusion at 1200 units/hr Check anti-Xa level in 6 hours and daily while on heparin Continue to monitor H&H and platelets  66, PharmD, [facility] Clinical Pharmacist Please see AMION for all Pharmacists' Contact Phone Numbers 01/12/2020, 8:01 AM

## 2020-01-13 ENCOUNTER — Inpatient Hospital Stay (HOSPITAL_COMMUNITY): Payer: Medicare Other

## 2020-01-13 DIAGNOSIS — Z8673 Personal history of transient ischemic attack (TIA), and cerebral infarction without residual deficits: Secondary | ICD-10-CM

## 2020-01-13 DIAGNOSIS — R1012 Left upper quadrant pain: Secondary | ICD-10-CM

## 2020-01-13 DIAGNOSIS — D735 Infarction of spleen: Principal | ICD-10-CM

## 2020-01-13 DIAGNOSIS — I361 Nonrheumatic tricuspid (valve) insufficiency: Secondary | ICD-10-CM

## 2020-01-13 DIAGNOSIS — I340 Nonrheumatic mitral (valve) insufficiency: Secondary | ICD-10-CM

## 2020-01-13 DIAGNOSIS — E7800 Pure hypercholesterolemia, unspecified: Secondary | ICD-10-CM

## 2020-01-13 DIAGNOSIS — I10 Essential (primary) hypertension: Secondary | ICD-10-CM

## 2020-01-13 LAB — CBC
HCT: 36.3 % (ref 36.0–46.0)
Hemoglobin: 11.7 g/dL — ABNORMAL LOW (ref 12.0–15.0)
MCH: 28.7 pg (ref 26.0–34.0)
MCHC: 32.2 g/dL (ref 30.0–36.0)
MCV: 89.2 fL (ref 80.0–100.0)
Platelets: 208 10*3/uL (ref 150–400)
RBC: 4.07 MIL/uL (ref 3.87–5.11)
RDW: 12.8 % (ref 11.5–15.5)
WBC: 8.8 10*3/uL (ref 4.0–10.5)
nRBC: 0 % (ref 0.0–0.2)

## 2020-01-13 LAB — HEPARIN LEVEL (UNFRACTIONATED)
Heparin Unfractionated: 0.38 IU/mL (ref 0.30–0.70)
Heparin Unfractionated: 0.45 IU/mL (ref 0.30–0.70)

## 2020-01-13 LAB — ECHOCARDIOGRAM COMPLETE
Height: 66 in
Weight: 2956.8 oz

## 2020-01-13 MED ORDER — ACETAMINOPHEN 500 MG PO TABS
1000.0000 mg | ORAL_TABLET | Freq: Four times a day (QID) | ORAL | Status: DC | PRN
  Administered 2020-01-13 – 2020-01-14 (×3): 1000 mg via ORAL
  Filled 2020-01-13 (×3): qty 2

## 2020-01-13 NOTE — Plan of Care (Signed)
  Problem: Education: Goal: Knowledge of General Education information will improve Description Including pain rating scale, medication(s)/side effects and non-pharmacologic comfort measures Outcome: Progressing   Problem: Health Behavior/Discharge Planning: Goal: Ability to manage health-related needs will improve Outcome: Progressing   

## 2020-01-13 NOTE — Progress Notes (Signed)
Progress Note  Patient Name: Allison Richardson Date of Encounter: 01/13/2020  Primary Cardiologist: Stanford Breed   Subjective   74 year old female who was found to have new onset atrial fibrillation when she presented with abdominal pain and found to have a splenic infarct as well as A. Fib. Ventricular rate is well controlled.  She has been started on heparin drip. The plan is to transition her to Eliquis as soon is certain that she does not need any procedures. Will give her tylenol for abd pain   Inpatient Medications    Scheduled Meds: . aspirin EC  81 mg Oral Daily  . atorvastatin  20 mg Oral QHS  . hydrochlorothiazide  12.5 mg Oral Daily  . losartan  50 mg Oral BID  . nebivolol  2.5 mg Oral BID  . pantoprazole  40 mg Oral Daily   Continuous Infusions: . heparin 1,300 Units/hr (01/13/20 0700)   PRN Meds: acetaminophen, HYDROmorphone (DILAUDID) injection, ondansetron **OR** ondansetron (ZOFRAN) IV   Vital Signs    Vitals:   01/13/20 0410 01/13/20 0411 01/13/20 0417 01/13/20 0731  BP:  (!) 146/87  128/69  Pulse:    67  Resp: 20 20 20 16   Temp:  98.3 F (36.8 C)  98.5 F (36.9 C)  TempSrc:  Oral  Oral  SpO2:  96%  95%  Weight:   83.8 kg   Height:        Intake/Output Summary (Last 24 hours) at 01/13/2020 1049 Last data filed at 01/13/2020 0700 Gross per 24 hour  Intake 1050 ml  Output --  Net 1050 ml   Last 3 Weights 01/13/2020 01/12/2020 01/12/2020  Weight (lbs) 184 lb 12.8 oz 179 lb 14.3 oz 180 lb  Weight (kg) 83.825 kg 81.6 kg 81.647 kg      Telemetry    Afib , well controlled V rate  - Personally Reviewed  ECG     - Personally Reviewed  Physical Exam   GEN: No acute distress.   Neck: No JVD Cardiac: RRR, no murmurs, rubs, or gallops.  Respiratory: Clear to auscultation bilaterally. GI: Soft, nontender, non-distended  MS: No edema; No deformity. Neuro:  Nonfocal  Psych: Normal affect   Labs    High Sensitivity Troponin:  No results for input(s):  TROPONINIHS in the last 720 hours.    Chemistry Recent Labs  Lab 01/12/20 0507  NA 135  K 3.7  CL 99  CO2 23  GLUCOSE 130*  BUN 20  CREATININE 0.64  CALCIUM 9.1  PROT 6.8  ALBUMIN 4.1  AST 23  ALT 20  ALKPHOS 66  BILITOT 0.8  GFRNONAA >60  GFRAA >60  ANIONGAP 13     Hematology Recent Labs  Lab 01/12/20 0507 01/13/20 0340  WBC 12.7* 8.8  RBC 4.29 4.07  HGB 12.3 11.7*  HCT 37.8 36.3  MCV 88.1 89.2  MCH 28.7 28.7  MCHC 32.5 32.2  RDW 12.8 12.8  PLT 274 208    BNPNo results for input(s): BNP, PROBNP in the last 168 hours.   DDimer No results for input(s): DDIMER in the last 168 hours.   Radiology    CT ABDOMEN PELVIS W CONTRAST  Result Date: 01/12/2020 CLINICAL DATA:  Diverticulitis suspected. Left flank pain since last night. EXAM: CT ABDOMEN AND PELVIS WITH CONTRAST TECHNIQUE: Multidetector CT imaging of the abdomen and pelvis was performed using the standard protocol following bolus administration of intravenous contrast. CONTRAST:  165mL OMNIPAQUE IOHEXOL 300 MG/ML  SOLN COMPARISON:  None. FINDINGS: Lower chest: Moderate sliding hiatal hernia. Mild scarring or atelectasis in the left lower lobe. Visible left ventricle and left atrium show no filling defects. Hepatobiliary: No focal liver abnormality.Single calcified gallstone. Gallbladder is full but not over distended or inflamed appearing Pancreas: Unremarkable. Spleen: There is patchy geographic non enhancement within the spleen, involving nearly 50%. No perisplenic hematoma or evidence of underlying lesion. Adrenals/Urinary Tract: Negative adrenals. No hydronephrosis or stone. Unremarkable bladder. Stomach/Bowel: No obstruction. No appendicitis. Multiple left colonic diverticula. Vascular/Lymphatic: No acute vascular abnormality. Atheromatous changes are mild for age. There is calcified plaque at the celiac origin but enhancement of the splenic artery and vein is unremarkable for contrast timing. No mass or  adenopathy. Reproductive:Hysterectomy Other: No ascites or pneumoperitoneum. Musculoskeletal: No acute abnormalities. Thoracic spondylosis with endplate sclerosis. 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 IMPRESSION: 1. Patchy splenic infarction. No infarct seen in the other viscera and no clear embolic source. 2. Cholelithiasis and colonic diverticulosis. 3. Moderate sliding hiatal hernia. Electronically Signed   By: Marnee Spring M.D.   On: 01/12/2020 06:43    Cardiac Studies      Patient Profile     74 y.o. female typically followed by Dr. Jens Som.  She was found to have a splenic infarct and new onset atrial fibrillation.  Assessment & Plan    One.  Atrial fibrillation: Her heart rate is fairly well controlled.  It is a little fast this morning because she is in lots of pain and is up pacing the floor.  She has not needed any rate controlling medications.  She is currently on IV heparin until we figure out whether or not she will need any procedures for her spleen.  I would suggest transitioning her to Eliquis 5 mg twice a day once that question has been addressed.  She will follow-up with Dr. Jens Som in the [facility] office in a month or two.   [facility] will sign off.   Medication Recommendations:  Transition to Eliquis when we are certain that she does not need any procedures for her splenic infarct.   Other recommendations (labs, testing, etc):   Follow up as an outpatient:  With Dr. Jens Som.   For questions or updates, please contact [facility] Please consult [email or web address] for contact info under        Signed, Kristeen Miss, MD  01/13/2020, 10:49 AM

## 2020-01-13 NOTE — Progress Notes (Signed)
ANTICOAGULATION CONSULT NOTE - Follow Up Consult  Pharmacy Consult for heparin Indication: atrial fibrillation and probable splenic infarct  Labs: Recent Labs    01/12/20 0507 01/12/20 1409 01/12/20 2148 01/13/20 0340  HGB 12.3  --   --  11.7*  HCT 37.8  --   --  36.3  PLT 274  --   --  208  HEPARINUNFRC  --  0.35 0.25* 0.38  CREATININE 0.64  --   --   --     Assessment/Plan:  74yo female therapeutic on heparin after rate change. Will continue gtt at current rate and confirm stable with additional level.   Vernard Gambles, PharmD, BCPS  01/13/2020,4:59 AM

## 2020-01-13 NOTE — Progress Notes (Signed)
  Echocardiogram 2D Echocardiogram has been performed.  Allison Richardson 01/13/2020, 3:42 PM

## 2020-01-13 NOTE — Progress Notes (Signed)
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 Adhesive [Tape] Other (See Comments)    Heart monitor leads- CREATED RED WELTS!!  . Penicillins Itching    Did it involve swelling of the face/tongue/throat, SOB, or low BP? Unk Did it involve sudden or severe rash/hives, skin peeling, or any reaction on the inside of your mouth or nose? Unk Did you need to seek medical attention at a hospital or doctor's office? No When did it last happen? Childhood If all above answers are "NO", may proceed with cephalosporin use.   . Simvastatin Itching  . Sumatriptan Succinate Other (See Comments)    Hypotension     Patient Measurements: Height: 5\' 6"  (167.6 cm) Weight: 83.8 kg (184 lb 12.8 oz) IBW/kg (Calculated) : 59.3  Vital Signs: Temp: 98.5 F (36.9 C) (04/02 0731) Temp Source: Oral (04/02 0731) BP: 128/69 (04/02 0731) Pulse Rate: 67 (04/02 0731)  Labs: Recent Labs    01/12/20 0507 01/12/20 1409 01/12/20 2148 01/13/20 0340 01/13/20 1036  HGB 12.3  --   --  11.7*  --   HCT 37.8  --   --  36.3  --   PLT 274  --   --  208  --   HEPARINUNFRC  --    < > 0.25* 0.38 0.45  CREATININE 0.64  --   --   --   --    < > = values in this interval not displayed.    Estimated Creatinine Clearance: 67.3 mL/min (by C-G formula based on SCr of 0.64 mg/dL).   Assessment: 74 yo with abdominal pain that started last night, fairly suddenly.  Pain worse throughout the night.  CT scan of the abdomen and pelvis shows patchy splenic infarction in the setting of atrial fibrillation (new diagnosis). Patient was not on anticoagulation PTA. Pharmacy consulted to dose heparin. Plans noted for apixaban is no procedures needed -heparin level at goal -hg= 11.7    Goal of Therapy:  Heparin level 0.3-0.7 units/ml Monitor platelets by anticoagulation protocol: Yes   Plan:  -Continue IV  heparin at 1200 units/hr -Monitor daily HL, CBC  66, PharmD Clinical Pharmacist **Pharmacist phone directory can now be found on [email or web address] ([email or web address]).  Listed under [facility].

## 2020-01-13 NOTE — Progress Notes (Signed)
PROGRESS NOTE  Allison Richardson UKG:[id number] DOB: Feb 10, 1946 DOA: 01/12/2020 PCP: Nadara Eaton, MD   LOS: 1 day   Brief Narrative / Interim history: 74 y.o. female with medical history significant of hypertension, hyperlipidemia, GERD, TIA presents to med [facility] ER due to severe left quadrant abdominal pain which started last night at 7:30 PM.  She was found to have patchy infarction of the spleen and A. fib, suspect cardioembolic.  Cardiology was consulted  Subjective / 24h Interval events: Doing well this morning, for left-sided abdominal pain has improved, it is now more localized and overall better.  Denies any chest pain, denies any palpitations  Assessment & Plan: Principal Problem New onset A. Fib with splenic infarction -She was started on heparin infusion, continue, monitor -Discussed with Dr. Elease Hashimoto with cardiology this morning. -2D echo pending -Case was discussed also with general surgery regarding her splenic infarctions, they recommend monitoring for now and if no increased pain or drop in hemoglobin can be safely transitioned to Eliquis and no need for further imaging.  Will repeat CBC tomorrow morning can cautiously continue heparin overnight -Rate controlled with nebivolol, telemetry reviewed this morning  Active problems Essential hypertension -Blood pressure stable this morning, continue hydrochlorothiazide, losartan, nebivolol  History of TIAs -Continue aspirin, statin, now on AC  Hyperlipidemia -Continue statin  Scheduled Meds: . aspirin EC  81 mg Oral Daily  . atorvastatin  20 mg Oral QHS  . hydrochlorothiazide  12.5 mg Oral Daily  . losartan  50 mg Oral BID  . nebivolol  2.5 mg Oral BID  . pantoprazole  40 mg Oral Daily   Continuous Infusions: . heparin 1,300 Units/hr (01/13/20 0700)   PRN Meds:.acetaminophen, HYDROmorphone (DILAUDID) injection, ondansetron **OR** ondansetron (ZOFRAN) IV  DVT prophylaxis: heparin infusion Code Status:  Full code Family Communication: d/w patient  Patient admitted from: home Anticipated d/c place: home Barriers to d/c: Continue IV heparin infusion overnight, there is a theoretical risk of bleeding, will need emergent imaging if she is hypotensive, hemoglobin drops or pain increases  Consultants:  Cardiology  Procedures:  2D echo: Pending  Microbiology  None   Antimicrobials: None     Objective: Vitals:   01/13/20 0410 01/13/20 0411 01/13/20 0417 01/13/20 0731  BP:  (!) 146/87  128/69  Pulse:    67  Resp: 20 20 20 16   Temp:  98.3 F (36.8 C)  98.5 F (36.9 C)  TempSrc:  Oral  Oral  SpO2:  96%  95%  Weight:   83.8 kg   Height:        Intake/Output Summary (Last 24 hours) at 01/13/2020 1208 Last data filed at 01/13/2020 0700 Gross per 24 hour  Intake 1050 ml  Output --  Net 1050 ml   Filed Weights   01/12/20 0443 01/12/20 1400 01/13/20 0417  Weight: 81.6 kg 81.6 kg 83.8 kg    Examination:  Constitutional: NAD Eyes: no scleral icterus ENMT: Mucous membranes are moist.  Neck: normal, supple Respiratory: clear to auscultation bilaterally, no wheezing, no crackles. Normal respiratory effort. No accessory muscle use.  Cardiovascular: Regular rate and rhythm, no murmurs / rubs / gallops. No LE edema. Good peripheral pulses Abdomen: non distended, no tenderness. Bowel sounds positive.  Musculoskeletal: no clubbing / cyanosis.  Skin: no rashes Neurologic: CN 2-12 grossly intact. Strength 5/5 in all 4.  Psychiatric: Normal judgment and insight. Alert and oriented x 3. Normal mood.    Data Reviewed: I have independently reviewed following labs  and imaging studies   CBC: Recent Labs  Lab 01/12/20 0507 01/13/20 0340  WBC 12.7* 8.8  NEUTROABS 10.7*  --   HGB 12.3 11.7*  HCT 37.8 36.3  MCV 88.1 89.2  PLT 274 539   Basic Metabolic Panel: Recent Labs  Lab 01/12/20 0507 01/12/20 1822  NA 135  --   K 3.7  --   CL 99  --   CO2 23  --   GLUCOSE 130*  --    BUN 20  --   CREATININE 0.64  --   CALCIUM 9.1  --   MG  --  2.0  PHOS  --  3.4   Liver Function Tests: Recent Labs  Lab 01/12/20 0507  AST 23  ALT 20  ALKPHOS 66  BILITOT 0.8  PROT 6.8  ALBUMIN 4.1   Coagulation Profile: No results for input(s): INR, PROTIME in the last 168 hours. HbA1C: No results for input(s): HGBA1C in the last 72 hours. CBG: No results for input(s): GLUCAP in the last 168 hours.  Recent Results (from the past 240 hour(s))  SARS CORONAVIRUS 2 (TAT 6-24 HRS) Nasopharyngeal Nasopharyngeal Swab     Status: None   Collection Time: 01/12/20  6:58 AM   Specimen: Nasopharyngeal Swab  Result Value Ref Range Status   SARS Coronavirus 2 NEGATIVE NEGATIVE Final    Comment: (NOTE) SARS-CoV-2 target nucleic acids are NOT DETECTED. The SARS-CoV-2 RNA is generally detectable in upper and lower respiratory specimens during the acute phase of infection. Negative results do not preclude SARS-CoV-2 infection, do not rule out co-infections with other pathogens, and should not be used as the sole basis for treatment or other patient management decisions. Negative results must be combined with clinical observations, patient history, and epidemiological information. The expected result is Negative. Fact Sheet for Patients: SugarRoll.be Fact Sheet for Healthcare Providers: https://www.woods-mathews.com/ This test is not yet approved or cleared by the [place] FDA and  has been authorized for detection and/or diagnosis of SARS-CoV-2 by FDA under an Emergency Use Authorization (EUA). This EUA will remain  in effect (meaning this test can be used) for the duration of the COVID-19 declaration under Section 56 4(b)(1) of the Act, 21 U.S.C. section 360bbb-3(b)(1), unless the authorization is terminated or revoked sooner. Performed at [facility], [place] [address] [place],  [address]      Radiology Studies: No  results found.  Marzetta Board, MD, PhD [facility]  Between 7 am - 7 pm I am available, please contact me via Amion or Securechat  Between 7 pm - 7 am I am not available, please contact night coverage MD/APP via Amion

## 2020-01-14 LAB — BASIC METABOLIC PANEL
Anion gap: 13 (ref 5–15)
BUN: 11 mg/dL (ref 8–23)
CO2: 27 mmol/L (ref 22–32)
Calcium: 9.1 mg/dL (ref 8.9–10.3)
Chloride: 100 mmol/L (ref 98–111)
Creatinine, Ser: 0.91 mg/dL (ref 0.44–1.00)
GFR calc Af Amer: >60 mL/min (ref >60)
GFR calc non Af Amer: >60 mL/min (ref >60)
Glucose, Bld: 105 mg/dL — ABNORMAL HIGH (ref 70–99)
Potassium: 3.5 mmol/L (ref 3.5–5.1)
Sodium: 140 mmol/L (ref 135–145)

## 2020-01-14 LAB — CBC
HCT: 39.2 % (ref 36.0–46.0)
Hemoglobin: 12.5 g/dL (ref 12.0–15.0)
MCH: 28 pg (ref 26.0–34.0)
MCHC: 31.9 g/dL (ref 30.0–36.0)
MCV: 87.9 fL (ref 80.0–100.0)
Platelets: 234 10*3/uL (ref 150–400)
RBC: 4.46 MIL/uL (ref 3.87–5.11)
RDW: 12.6 % (ref 11.5–15.5)
WBC: 11.4 10*3/uL — ABNORMAL HIGH (ref 4.0–10.5)
nRBC: 0 % (ref 0.0–0.2)

## 2020-01-14 LAB — HEPARIN LEVEL (UNFRACTIONATED): Heparin Unfractionated: 0.39 IU/mL (ref 0.30–0.70)

## 2020-01-14 MED ORDER — APIXABAN 5 MG PO TABS
5.0000 mg | ORAL_TABLET | Freq: Two times a day (BID) | ORAL | Status: DC
  Administered 2020-01-14 – 2020-01-15 (×3): 5 mg via ORAL
  Filled 2020-01-14 (×3): qty 1

## 2020-01-14 MED ORDER — NEBIVOLOL HCL 10 MG PO TABS
5.0000 mg | ORAL_TABLET | Freq: Two times a day (BID) | ORAL | Status: DC
  Administered 2020-01-14 – 2020-01-15 (×2): 5 mg via ORAL
  Filled 2020-01-14 (×2): qty 1

## 2020-01-14 NOTE — Progress Notes (Signed)
A consult was placed to IV Therapy to restart the iv, and also asking for ultrasound to be used, as "ultrasound had to be used before;"  Pt is no longer receiving any IV medications, and is hoping to be discharged home tomorrow;  Pt is on telemetry, but really preferred NOT to have another iv inserted unless absolutely necessary;  Explained to the pt that IV Therapy is here 24/7, and can be called for iv access if needed;  Also spoke with Dois Davenport, RN;  Will hold iv restart at this time, and have staff place another consult if iv restart is needed.  Pt is happy with this decision.

## 2020-01-14 NOTE — Progress Notes (Signed)
PROGRESS NOTE  Allison Richardson WVP:[id number] DOB: 02-06-1946 DOA: 01/12/2020 PCP: Nadara Eaton, MD   LOS: 2 days   Brief Narrative / Interim history: 74 y.o. female with medical history significant of hypertension, hyperlipidemia, GERD, TIA presents to med [facility] ER due to severe left quadrant abdominal pain which started last night at 7:30 PM.  She was found to have patchy infarction of the spleen and A. fib, suspect cardioembolic.  Cardiology was consulted  Subjective / 24h Interval events: Feeling well this morning, less abdominal pain, however feels a little bit more palpitations shortness of breath, and saw that her heart rate is in the 150s  Assessment & Plan: Principal Problem New onset A. Fib with splenic infarction, RVR today -She was started on heparin infusion, her hemoglobin has remained stable and her abdominal pain is improved we will convert to Eliquis today -2D echo with normal EF -Case was discussed also with general surgery regarding her splenic infarctions, they recommend monitoring for now and if no increased pain or drop in hemoglobin she can be safely transitioned to Eliquis.  We will do that today -This morning she developed RVR with rates into the 150s-160s when up and getting around, a little bit dyspneic with that.  I have reconsulted cardiology to assist with rate control -Her beta-blocker dose has been doubled, monitor overnight and if stable will be discharged tomorrow  Active problems Essential hypertension -Blood pressure stable this morning, continue hydrochlorothiazide, losartan, nebivolol  History of TIAs -Continue aspirin, statin, now on [facility]  Hyperlipidemia -Continue statin  Scheduled Meds: . apixaban  5 mg Oral BID  . atorvastatin  20 mg Oral QHS  . hydrochlorothiazide  12.5 mg Oral Daily  . losartan  50 mg Oral BID  . nebivolol  5 mg Oral BID  . pantoprazole  40 mg Oral Daily   Continuous Infusions:  PRN Meds:.acetaminophen,  HYDROmorphone (DILAUDID) injection, ondansetron **OR** ondansetron (ZOFRAN) IV  DVT prophylaxis: heparin infusion Code Status: Full code Family Communication: d/w patient  Patient admitted from: home Anticipated d/c place: home Barriers to d/c: Developed A. fib with RVR today, anticipate home in 24 hours if rate controlled  Consultants:  Cardiology  Procedures:  2D echo  Microbiology  None   Antimicrobials: None     Objective: Vitals:   01/13/20 2030 01/14/20 0508 01/14/20 0529 01/14/20 0839  BP: (!) 148/88 (!) 142/77  (!) 104/56  Pulse: 89   98  Resp: 14   18  Temp: 98.1 F (36.7 C) 98.9 F (37.2 C)  98.4 F (36.9 C)  TempSrc: Oral Oral  Oral  SpO2: 98% 97%  97%  Weight:   83.2 kg   Height:        Intake/Output Summary (Last 24 hours) at 01/14/2020 1714 Last data filed at 01/14/2020 0800 Gross per 24 hour  Intake 461 ml  Output --  Net 461 ml   Filed Weights   01/12/20 1400 01/13/20 0417 01/14/20 0529  Weight: 81.6 kg 83.8 kg 83.2 kg    Examination:  Constitutional: No distress Eyes: No icterus ENMT: Moist mucous membranes Neck: normal, supple Respiratory: Clear to auscultation bilaterally without wheezing or crackles, normal respiratory effort Cardiovascular: Irregularly irregular, tachycardic, no peripheral edema Abdomen: Soft, nontender, nondistended Musculoskeletal: no clubbing / cyanosis.  Skin: No rashes appreciated Neurologic: Grossly nonfocal, ambulatory   Data Reviewed: I have independently reviewed following labs and imaging studies   CBC: Recent Labs  Lab 01/12/20 0507 01/13/20 0340 01/14/20 5462  WBC 12.7* 8.8 11.4*  NEUTROABS 10.7*  --   --   HGB 12.3 11.7* 12.5  HCT 37.8 36.3 39.2  MCV 88.1 89.2 87.9  PLT 274 208 270   Basic Metabolic Panel: Recent Labs  Lab 01/12/20 0507 01/12/20 1822 01/14/20 0306  NA 135  --  140  K 3.7  --  3.5  CL 99  --  100  CO2 23  --  27  GLUCOSE 130*  --  105*  BUN 20  --  11  CREATININE  0.64  --  0.91  CALCIUM 9.1  --  9.1  MG  --  2.0  --   PHOS  --  3.4  --    Liver Function Tests: Recent Labs  Lab 01/12/20 0507  AST 23  ALT 20  ALKPHOS 66  BILITOT 0.8  PROT 6.8  ALBUMIN 4.1   Coagulation Profile: No results for input(s): INR, PROTIME in the last 168 hours. HbA1C: No results for input(s): HGBA1C in the last 72 hours. CBG: No results for input(s): GLUCAP in the last 168 hours.  Recent Results (from the past 240 hour(s))  SARS CORONAVIRUS 2 (TAT 6-24 HRS) Nasopharyngeal Nasopharyngeal Swab     Status: None   Collection Time: 01/12/20  6:58 AM   Specimen: Nasopharyngeal Swab  Result Value Ref Range Status   SARS Coronavirus 2 NEGATIVE NEGATIVE Final    Comment: (NOTE) SARS-CoV-2 target nucleic acids are NOT DETECTED. The SARS-CoV-2 RNA is generally detectable in upper and lower respiratory specimens during the acute phase of infection. Negative results do not preclude SARS-CoV-2 infection, do not rule out co-infections with other pathogens, and should not be used as the sole basis for treatment or other patient management decisions. Negative results must be combined with clinical observations, patient history, and epidemiological information. The expected result is Negative. Fact Sheet for Patients: SugarRoll.be Fact Sheet for Healthcare Providers: https://www.woods-mathews.com/ This test is not yet approved or cleared by the [place] FDA and  has been authorized for detection and/or diagnosis of SARS-CoV-2 by FDA under an Emergency Use Authorization (EUA). This EUA will remain  in effect (meaning this test can be used) for the duration of the COVID-19 declaration under Section 56 4(b)(1) of the Act, 21 U.S.C. section 360bbb-3(b)(1), unless the authorization is terminated or revoked sooner. Performed at [facility], [place] [address], [place], [place] [address]      Radiology Studies: No results  found.  Marzetta Board, MD, PhD [facility]  Between 7 am - 7 pm I am available, please contact me via Amion or Securechat  Between 7 pm - 7 am I am not available, please contact night coverage MD/APP via Amion

## 2020-01-14 NOTE — Progress Notes (Addendum)
Progress Note  Patient Name: Allison Richardson Date of Encounter: 01/14/2020  Primary Cardiologist: Olga Millers, MD   Subjective   No CP or dyspnea  Inpatient Medications    Scheduled Meds: . aspirin EC  81 mg Oral Daily  . atorvastatin  20 mg Oral QHS  . hydrochlorothiazide  12.5 mg Oral Daily  . losartan  50 mg Oral BID  . nebivolol  2.5 mg Oral BID  . pantoprazole  40 mg Oral Daily   Continuous Infusions: . heparin 1,300 Units/hr (01/14/20 0843)   PRN Meds: acetaminophen, HYDROmorphone (DILAUDID) injection, ondansetron **OR** ondansetron (ZOFRAN) IV   Vital Signs    Vitals:   01/13/20 2030 01/14/20 0508 01/14/20 0529 01/14/20 0839  BP: (!) 148/88 (!) 142/77  (!) 104/56  Pulse: 89   98  Resp: 14   18  Temp: 98.1 F (36.7 C) 98.9 F (37.2 C)  98.4 F (36.9 C)  TempSrc: Oral Oral  Oral  SpO2: 98% 97%  97%  Weight:   83.2 kg   Height:        Intake/Output Summary (Last 24 hours) at 01/14/2020 1323 Last data filed at 01/14/2020 0800 Gross per 24 hour  Intake 461 ml  Output --  Net 461 ml   Last 3 Weights 01/14/2020 01/13/2020 01/12/2020  Weight (lbs) 183 lb 6.8 oz 184 lb 12.8 oz 179 lb 14.3 oz  Weight (kg) 83.2 kg 83.825 kg 81.6 kg      Telemetry    Atrial fibrillation- Personally Reviewed   Physical Exam   GEN: No acute distress.   Neck: No JVD Cardiac: irregular Respiratory: Clear to auscultation bilaterally. GI: Soft, nontender, non-distended  MS: No edema Neuro:  Nonfocal  Psych: Normal affect   Labs    Chemistry Recent Labs  Lab 01/12/20 0507 01/14/20 0306  NA 135 140  K 3.7 3.5  CL 99 100  CO2 23 27  GLUCOSE 130* 105*  BUN 20 11  CREATININE 0.64 0.91  CALCIUM 9.1 9.1  PROT 6.8  --   ALBUMIN 4.1  --   AST 23  --   ALT 20  --   ALKPHOS 66  --   BILITOT 0.8  --   GFRNONAA >60 >60  GFRAA >60 >60  ANIONGAP 13 13     Hematology Recent Labs  Lab 01/12/20 0507 01/13/20 0340 01/14/20 0306  WBC 12.7* 8.8 11.4*  RBC 4.29 4.07  4.46  HGB 12.3 11.7* 12.5  HCT 37.8 36.3 39.2  MCV 88.1 89.2 87.9  MCH 28.7 28.7 28.0  MCHC 32.5 32.2 31.9  RDW 12.8 12.8 12.6  PLT 274 208 234    Radiology    ECHOCARDIOGRAM COMPLETE  Result Date: 01/13/2020    ECHOCARDIOGRAM REPORT   Patient Name:   Allison Richardson Date of Exam: 01/13/2020 Medical Rec #:  [id number]   Height:       66.0 in Accession #:    [id number]  Weight:       184.8 lb Date of Birth:  December 28, 1945    BSA:          1.934 m Patient Age:    74 years    BP:           128/69 mmHg Patient Gender: F           HR:           67 bpm. Exam Location:  Inpatient Procedure: 2D Echo Indications:    atrial fibrillation 427.31  History:        Patient has prior history of Echocardiogram examinations, most                 recent 12/06/2017. Arrythmias:Atrial Fibrillation; Risk                 Factors:Hypertension and Dyslipidemia.  Sonographer:    Delcie Roch Referring Phys: [id number] ROBERT SCHERTZ IMPRESSIONS  1. Normal LV function; mild LVH; mild MR; biatrial enlargement; mild TR.  2. Left ventricular ejection fraction, by estimation, is 60 to 65%. The left ventricle has normal function. The left ventricle has no regional wall motion abnormalities. There is mild left ventricular hypertrophy. Left ventricular diastolic function could not be evaluated.  3. Right ventricular systolic function is normal. The right ventricular size is normal. There is mildly elevated pulmonary artery systolic pressure.  4. Left atrial size was mild to moderately dilated.  5. Right atrial size was mildly dilated.  6. The mitral valve is normal in structure. Mild mitral valve regurgitation. No evidence of mitral stenosis.  7. The aortic valve is tricuspid. Aortic valve regurgitation is not visualized. Mild aortic valve sclerosis is present, with no evidence of aortic valve stenosis.  8. The inferior vena cava is normal in size with greater than 50% respiratory variability, suggesting right atrial pressure of 3 mmHg. FINDINGS   Left Ventricle: Left ventricular ejection fraction, by estimation, is 60 to 65%. The left ventricle has normal function. The left ventricle has no regional wall motion abnormalities. The left ventricular internal cavity size was normal in size. There is  mild left ventricular hypertrophy. Left ventricular diastolic function could not be evaluated due to atrial fibrillation. Left ventricular diastolic function could not be evaluated. Right Ventricle: The right ventricular size is normal. Right ventricular systolic function is normal. There is mildly elevated pulmonary artery systolic pressure. The tricuspid regurgitant velocity is 2.80 m/s, and with an assumed right atrial pressure of 3 mmHg, the estimated right ventricular systolic pressure is 34.4 mmHg. Left Atrium: Left atrial size was mild to moderately dilated. Right Atrium: Right atrial size was mildly dilated. Pericardium: There is no evidence of pericardial effusion. Mitral Valve: The mitral valve is normal in structure. Normal mobility of the mitral valve leaflets. Mild mitral annular calcification. Mild mitral valve regurgitation. No evidence of mitral valve stenosis. Tricuspid Valve: The tricuspid valve is normal in structure. Tricuspid valve regurgitation is mild . No evidence of tricuspid stenosis. Aortic Valve: The aortic valve is tricuspid. Aortic valve regurgitation is not visualized. Mild aortic valve sclerosis is present, with no evidence of aortic valve stenosis. Pulmonic Valve: The pulmonic valve was normal in structure. Pulmonic valve regurgitation is trivial. No evidence of pulmonic stenosis. Aorta: The aortic root is normal in size and structure. Venous: The inferior vena cava is normal in size with greater than 50% respiratory variability, suggesting right atrial pressure of 3 mmHg.  Additional Comments: Normal LV function; mild LVH; mild MR; biatrial enlargement; mild TR.  LEFT VENTRICLE PLAX 2D LVIDd:         4.20 cm LVIDs:         2.90 cm  LV PW:         0.90 cm LV IVS:        1.00 cm LVOT diam:     1.80 cm LV SV:         63 LV SV Index:   33 LVOT Area:     2.54 cm  RIGHT VENTRICLE RV  S prime:     9.46 cm/s TAPSE (M-mode): 1.5 cm LEFT ATRIUM             Index       RIGHT ATRIUM           Index LA diam:        4.70 cm 2.43 cm/m  RA Area:     19.20 cm LA Vol (A2C):   75.9 ml 39.25 ml/m RA Volume:   54.60 ml  28.24 ml/m LA Vol (A4C):   68.7 ml 35.53 ml/m LA Biplane Vol: 72.2 ml 37.34 ml/m  AORTIC VALVE LVOT Vmax:   116.00 cm/s LVOT Vmean:  78.200 cm/s LVOT VTI:    0.249 m  AORTA Ao Root diam: 2.70 cm TRICUSPID VALVE TR Peak grad:   31.4 mmHg TR Vmax:        280.00 cm/s  SHUNTS Systemic VTI:  0.25 m Systemic Diam: 1.80 cm Kirk Ruths MD Electronically signed by Kirk Ruths MD Signature Date/Time: 01/13/2020/3:45:37 PM    Final     Patient Profile     74 y.o. female with past medical history of hypertension and cerebral aneurysm admitted with new onset atrial fibrillation and splenic infarct.  Echocardiogram shows normal LV function, mild left ventricular hypertrophy, mild mitral regurgitation, biatrial enlargement and mild tricuspid regurgitation.  Assessment & Plan    1 persistent atrial fibrillation-telemetry personally reviewed.  Heart rate mildly elevated with ambulation.  Increase nebivolol to 5 mg twice daily.  Follow on telemetry and adjust as needed.  Patient will need transition from IV heparin to apixaban 5 mg twice daily when okay with primary service.  We will see patient back in the office in 4 to 6 weeks.  At that time we will discuss rate control versus rhythm control.  She does have some fatigue over the past 3 weeks and mild dyspnea on exertion.  It would likely be worthwhile attempting to reestablish sinus rhythm to see if her symptoms improve.  2 hypertension-plan to continue present medications but increase nebivolol as outlined above.  3 history of TIA-discontinue aspirin given need for apixaban.  4  hyperlipidemia-continue statin.  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  For questions or updates, please contact [place] Please consult [email or web address] for contact info under        Signed, Kirk Ruths, MD  01/14/2020, 1:23 PM

## 2020-01-14 NOTE — Progress Notes (Addendum)
ANTICOAGULATION CONSULT NOTE - Follow Up Consult  Pharmacy Consult for Heparin >> Eliquis Indication:  A. Fib with probable splenic infarct  Allergies  Allergen Reactions  . Adhesive [Tape] Other (See Comments)    Heart monitor leads- CREATED RED WELTS!!  . Penicillins Itching    Did it involve swelling of the face/tongue/throat, SOB, or low BP? Unk Did it involve sudden or severe rash/hives, skin peeling, or any reaction on the inside of your mouth or nose? Unk Did you need to seek medical attention at a hospital or doctor's office? No When did it last happen? Childhood If all above answers are "NO", may proceed with cephalosporin use.   . Simvastatin Itching  . Sumatriptan Succinate Other (See Comments)    Hypotension     Patient Measurements: Height: 5\' 6"  (167.6 cm) Weight: 83.2 kg (183 lb 6.8 oz) IBW/kg (Calculated) : 59.3  Vital Signs: Temp: 98.9 F (37.2 C) (04/03 0508) Temp Source: Oral (04/03 0508) BP: 142/77 (04/03 0508) Pulse Rate: 89 (04/02 2030)  Labs: Recent Labs    01/12/20 0507 01/12/20 1409 01/13/20 0340 01/13/20 1036 01/14/20 0306  HGB 12.3  --  11.7*  --  12.5  HCT 37.8  --  36.3  --  39.2  PLT 274  --  208  --  234  HEPARINUNFRC  --    < > 0.38 0.45 0.39  CREATININE 0.64  --   --   --  0.91   < > = values in this interval not displayed.    Estimated Creatinine Clearance: 59 mL/min (by C-G formula based on SCr of 0.91 mg/dL).   Assessment: 74 yo with abdominal pain that started last night, fairly suddenly.  Pain worse throughout the night.  CT scan of the abdomen and pelvis shows patchy splenic infarction in the setting of atrial fibrillation (new diagnosis). Patient was not on anticoagulation PTA. Pharmacy consulted to dose heparin. Plans noted for apixaban if no procedures or further imaging needed. No overt bleeding noted. -heparin level at goal -hg= 112.5    Goal of Therapy:  Heparin level 0.3-0.7 units/ml Monitor platelets by  anticoagulation protocol: Yes   Plan:  -Continue IV heparin at 1200 units/hr -Monitor daily HL, CBC -Monitor for signs/symptoms of beeding  66, PharmD PGY1 Pharmacy Resident Phone: [phone] 01/14/2020  7:16 AM  Please check [email or web address] for unit-specific pharmacy phone numbers.  ========================================== PM Update: Switching heparin to Eliquis 5mg  BID. Will stop heparin at the time of first Eliquis dose.

## 2020-01-15 DIAGNOSIS — I4891 Unspecified atrial fibrillation: Secondary | ICD-10-CM

## 2020-01-15 DIAGNOSIS — I749 Embolism and thrombosis of unspecified artery: Secondary | ICD-10-CM

## 2020-01-15 DIAGNOSIS — R7309 Other abnormal glucose: Secondary | ICD-10-CM

## 2020-01-15 LAB — BASIC METABOLIC PANEL WITH GFR
Anion gap: 10 (ref 5–15)
BUN: 12 mg/dL (ref 8–23)
CO2: 27 mmol/L (ref 22–32)
Calcium: 8.6 mg/dL — ABNORMAL LOW (ref 8.9–10.3)
Chloride: 102 mmol/L (ref 98–111)
Creatinine, Ser: 0.89 mg/dL (ref 0.44–1.00)
GFR calc Af Amer: >60 mL/min
GFR calc non Af Amer: >60 mL/min
Glucose, Bld: 106 mg/dL — ABNORMAL HIGH (ref 70–99)
Potassium: 3.8 mmol/L (ref 3.5–5.1)
Sodium: 139 mmol/L (ref 135–145)

## 2020-01-15 LAB — CBC
HCT: 37.6 % (ref 36.0–46.0)
Hemoglobin: 12.3 g/dL (ref 12.0–15.0)
MCH: 28.6 pg (ref 26.0–34.0)
MCHC: 32.7 g/dL (ref 30.0–36.0)
MCV: 87.4 fL (ref 80.0–100.0)
Platelets: 236 10*3/uL (ref 150–400)
RBC: 4.3 MIL/uL (ref 3.87–5.11)
RDW: 12.6 % (ref 11.5–15.5)
WBC: 8.6 10*3/uL (ref 4.0–10.5)
nRBC: 0 % (ref 0.0–0.2)

## 2020-01-15 MED ORDER — NEBIVOLOL HCL 5 MG PO TABS: 5.0000 mg | ORAL_TABLET | Freq: Two times a day (BID) | ORAL | 0 refills | Status: DC

## 2020-01-15 MED ORDER — APIXABAN 5 MG PO TABS: 5.0000 mg | ORAL_TABLET | Freq: Two times a day (BID) | ORAL | 1 refills | Status: DC

## 2020-01-15 NOTE — Discharge Summary (Addendum)
Physician Discharge Summary  Allison Richardson WCB:[id number] DOB: Jan 18, 1946 DOA: 01/12/2020  PCP: Nadara Eaton, MD  Admit date: 01/12/2020 Discharge date: 01/15/2020  Admitted From: home Disposition:  home  Recommendations for Outpatient Follow-up:  1. Follow up with PCP in 1-2 weeks 2. Follow-up with Dr. Jens Som as an outpatient  Home Health: None Equipment/Devices: None  Discharge Condition: Stable CODE STATUS: Full code Diet recommendation: Heart healthy  HPI: Per admitting MD, Allison Richardson is a 74 y.o. female with medical history significant of hypertension, hyperlipidemia, GERD, TIA presents to med [facility] ER due to severe left quadrant abdominal pain which started last night at 7:30 PM Patient tells me that last night at 7:30 PM she started having left upper quadrant abdominal pain, 8 out of 10, radiates to left side of back, denies association with nausea, vomiting, diarrhea, constipation, fever, chills, melena, chest pain, shortness of breath, palpitation, leg swelling, headache or blurry vision.  No previous history of A. fib or CHF.  No history of smoking, alcohol use. In ED: Patient's vital signs stable, she is afebrile with leukocytosis of 12.7, lipase: WNL, COVID-19 negative.  EKG shows A. fib, CT abdomen/pelvis shows patchy infarction of the spleen.  Patient started on IV heparin for the concern of cardioembolic phenomena.  Cardiology consulted for new onset A. fib.  Patient transferred to [facility] for further evaluation and management.  Hospital Course / Discharge diagnoses: Principal Problem New onset A. Fib with splenic infarction, RVR-She was started on heparin infusion, her hemoglobin has remained stable and her abdominal pain is improved, she was converted to Eliquis and tolerated that well.  Cardiology consulted and followed patient while hospitalized.  She underwent a 2D echo which showed normal EF.  Her case was discussed with general surgery  regarding her splenic infarctions, recommended monitoring, she has had improving pain, hemoglobin has remained stable and there is no need for further imaging at this point.  She has had few episodes of RVR with heart rate to the 150s, and her home beta-blocker has been increased in dose per cardiology.  She is now rate controlled, she is tolerating anticoagulation, and will be discharged home in stable condition with outpatient follow-up.  Active problems Essential hypertension -resume home medications  History of TIAs -now on [place], hold [place] per cards Hyperlipidemia -Continue statin   Discharge Instructions   Allergies as of 01/15/2020      Reactions   Adhesive [tape] Other (See Comments)   Heart monitor leads- CREATED RED WELTS!!   Penicillins Itching   Did it involve swelling of the face/tongue/throat, SOB, or low BP? Unk Did it involve sudden or severe rash/hives, skin peeling, or any reaction on the inside of your mouth or nose? Unk Did you need to seek medical attention at a hospital or doctor's office? No When did it last happen? Childhood If all above answers are "NO", may proceed with cephalosporin use.   Simvastatin Itching   Sumatriptan Succinate Other (See Comments)   Hypotension      Medication List    STOP taking these medications   aspirin EC 325 MG tablet   aspirin EC 81 MG tablet     TAKE these medications   amLODipine 2.5 MG tablet Commonly known as: NORVASC Take 2.5 mg by mouth at bedtime.   apixaban 5 MG Tabs tablet Commonly known as: ELIQUIS Take 1 tablet (5 mg total) by mouth 2 (two) times daily.   ascorbic acid 500 MG tablet  Commonly known as: VITAMIN C Take 500 mg by mouth daily.   atorvastatin 10 MG tablet Commonly known as: LIPITOR Take 10 mg by mouth at bedtime.   BC HEADACHE POWDER PO Take 1 packet by mouth as needed (for pain or headaches).   BENEFIBER DRINK MIX PO Take by mouth See admin instructions. Mix 1 teaspoonful of powder into  6-8 ounces of coffee and drink in the morning (once a day)   cyanocobalamin 1000 MCG/ML injection Commonly known as: (VITAMIN B-12) Inject 1,000 mcg into the muscle every 30 (thirty) days.   Digestive Adv+Bowel Support Caps Take 1 capsule by mouth daily.   hydrochlorothiazide 12.5 MG tablet Commonly known as: HYDRODIURIL Take 1 tablet (12.5 mg total) by mouth daily.   losartan 50 MG tablet Commonly known as: COZAAR Take 1 tablet (50 mg total) by mouth 2 (two) times daily.   nebivolol 5 MG tablet Commonly known as: BYSTOLIC Take 1 tablet (5 mg total) by mouth 2 (two) times daily. What changed: how much to take   omeprazole 40 MG capsule Commonly known as: PRILOSEC Take 1 capsule (40 mg total) by mouth daily. What changed: when to take this   Vitamin D-3 25 MCG (1000 UT) Caps Take 1,000 Units by mouth daily.      Consultations:  Cardiology   Procedures/Studies:  CT ABDOMEN PELVIS W CONTRAST  Result Date: 01/12/2020 CLINICAL DATA:  Diverticulitis suspected. Left flank pain since last night. EXAM: CT ABDOMEN AND PELVIS WITH CONTRAST TECHNIQUE: Multidetector CT imaging of the abdomen and pelvis was performed using the standard protocol following bolus administration of intravenous contrast. CONTRAST:  OMNIPAQUE IOHEXOL 300 MG/ML  SOLN COMPARISON:  None. FINDINGS: Lower chest: Moderate sliding hiatal hernia. Mild scarring or atelectasis in the left lower lobe. Visible left ventricle and left atrium show no filling defects. Hepatobiliary: No focal liver abnormality.Single calcified gallstone. Gallbladder is full but not over distended or inflamed appearing Pancreas: Unremarkable. Spleen: There is patchy geographic non enhancement within the spleen, involving nearly 50%. No perisplenic hematoma or evidence of underlying lesion. Adrenals/Urinary Tract: Negative adrenals. No hydronephrosis or stone. Unremarkable bladder. Stomach/Bowel: No obstruction. No appendicitis. Multiple left  colonic diverticula. Vascular/Lymphatic: No acute vascular abnormality. Atheromatous changes are mild for age. There is calcified plaque at the celiac origin but enhancement of the splenic artery and vein is unremarkable for contrast timing. No mass or adenopathy. Reproductive:Hysterectomy Other: No ascites or pneumoperitoneum. Musculoskeletal: No acute abnormalities. Thoracic spondylosis with endplate sclerosis. 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 IMPRESSION: 1. Patchy splenic infarction. No infarct seen in the other viscera and no clear embolic source. 2. Cholelithiasis and colonic diverticulosis. 3. Moderate sliding hiatal hernia. Electronically Signed   By: Marnee Spring M.D.   On: 01/12/2020 06:43   ECHOCARDIOGRAM COMPLETE  Result Date: 01/13/2020    ECHOCARDIOGRAM REPORT   Patient Name:   PILAR WESTERGAARD Date of Exam: 01/13/2020 Medical Rec #:  [id number]   Height:       66.0 in Accession #:    [id number]  Weight:       184.8 lb Date of Birth:  [birth year]    BSA:          1.934 m Patient Age:    74 years    BP:           128/69 mmHg Patient Gender: F           HR:           67 bpm.  Exam Location:  Inpatient Procedure: 2D Echo Indications:    atrial fibrillation 427.31  History:        Patient has prior history of Echocardiogram examinations, most                 recent 12/06/2017. Arrythmias:Atrial Fibrillation; Risk                 Factors:Hypertension and Dyslipidemia.  Sonographer:    Delcie RochLauren Pennington Referring Phys: [id number] ROBERT SCHERTZ IMPRESSIONS  1. Normal LV function; mild LVH; mild MR; biatrial enlargement; mild TR.  2. Left ventricular ejection fraction, by estimation, is 60 to 65%. The left ventricle has normal function. The left ventricle has no regional wall motion abnormalities. There is mild left ventricular hypertrophy. Left ventricular diastolic function could not be evaluated.  3. Right ventricular systolic function is normal. The right ventricular size is normal. There is  mildly elevated pulmonary artery systolic pressure.  4. Left atrial size was mild to moderately dilated.  5. Right atrial size was mildly dilated.  6. The mitral valve is normal in structure. Mild mitral valve regurgitation. No evidence of mitral stenosis.  7. The aortic valve is tricuspid. Aortic valve regurgitation is not visualized. Mild aortic valve sclerosis is present, with no evidence of aortic valve stenosis.  8. The inferior vena cava is normal in size with greater than 50% respiratory variability, suggesting right atrial pressure of 3 mmHg. FINDINGS  Left Ventricle: Left ventricular ejection fraction, by estimation, is 60 to 65%. The left ventricle has normal function. The left ventricle has no regional wall motion abnormalities. The left ventricular internal cavity size was normal in size. There is  mild left ventricular hypertrophy. Left ventricular diastolic function could not be evaluated due to atrial fibrillation. Left ventricular diastolic function could not be evaluated. Right Ventricle: The right ventricular size is normal. Right ventricular systolic function is normal. There is mildly elevated pulmonary artery systolic pressure. The tricuspid regurgitant velocity is 2.80 m/s, and with an assumed right atrial pressure of 3 mmHg, the estimated right ventricular systolic pressure is 34.4 mmHg. Left Atrium: Left atrial size was mild to moderately dilated. Right Atrium: Right atrial size was mildly dilated. Pericardium: There is no evidence of pericardial effusion. Mitral Valve: The mitral valve is normal in structure. Normal mobility of the mitral valve leaflets. Mild mitral annular calcification. Mild mitral valve regurgitation. No evidence of mitral valve stenosis. Tricuspid Valve: The tricuspid valve is normal in structure. Tricuspid valve regurgitation is mild . No evidence of tricuspid stenosis. Aortic Valve: The aortic valve is tricuspid. Aortic valve regurgitation is not visualized. Mild  aortic valve sclerosis is present, with no evidence of aortic valve stenosis. Pulmonic Valve: The pulmonic valve was normal in structure. Pulmonic valve regurgitation is trivial. No evidence of pulmonic stenosis. Aorta: The aortic root is normal in size and structure. Venous: The inferior vena cava is normal in size with greater than 50% respiratory variability, suggesting right atrial pressure of 3 mmHg.  Additional Comments: Normal LV function; mild LVH; mild MR; biatrial enlargement; mild TR.  LEFT VENTRICLE PLAX 2D LVIDd:         4.20 cm LVIDs:         2.90 cm LV PW:         0.90 cm LV IVS:        1.00 cm LVOT diam:     1.80 cm LV SV:         63 LV SV Index:  33 LVOT Area:     2.54 cm  RIGHT VENTRICLE RV S prime:     9.46 cm/s TAPSE (M-mode): 1.5 cm LEFT ATRIUM             Index       RIGHT ATRIUM           Index LA diam:        4.70 cm 2.43 cm/m  RA Area:     19.20 cm LA Vol (A2C):   75.9 ml 39.25 ml/m RA Volume:   54.60 ml  28.24 ml/m LA Vol (A4C):   68.7 ml 35.53 ml/m LA Biplane Vol: 72.2 ml 37.34 ml/m  AORTIC VALVE LVOT Vmax:   116.00 cm/s LVOT Vmean:  78.200 cm/s LVOT VTI:    0.249 m  AORTA Ao Root diam: 2.70 cm TRICUSPID VALVE TR Peak grad:   31.4 mmHg TR Vmax:        280.00 cm/s  SHUNTS Systemic VTI:  0.25 m Systemic Diam: 1.80 cm Olga Millers MD Electronically signed by Olga Millers MD Signature Date/Time: 01/13/2020/3:45:37 PM    Final      Subjective: - no chest pain, shortness of breath, no abdominal pain, nausea or vomiting. Ready to go home  Discharge Exam: BP 118/70 (BP Location: Right Arm)   Pulse 70   Temp 98.4 F (36.9 C) (Oral)   Resp 18   Ht 5\' 6"  (1.676 m)   Wt 82 kg   SpO2 97%   BMI 29.18 kg/m   General: Pt is alert, awake, not in acute distress Cardiovascular: irregular  Respiratory: CTA bilaterally, no wheezing, no rhonchi Abdominal: Soft, NT, ND, bowel sounds + Extremities: no edema, no cyanosis    The results of significant diagnostics from this  hospitalization (including imaging, microbiology, ancillary and laboratory) are listed below for reference.     Microbiology: Recent Results (from the past 240 hour(s))  SARS CORONAVIRUS 2 (TAT 6-24 HRS) Nasopharyngeal Nasopharyngeal Swab     Status: None   Collection Time: 01/12/20  6:58 AM   Specimen: Nasopharyngeal Swab  Result Value Ref Range Status   SARS Coronavirus 2 NEGATIVE NEGATIVE Final    Comment: (NOTE) SARS-CoV-2 target nucleic acids are NOT DETECTED. The SARS-CoV-2 RNA is generally detectable in upper and lower respiratory specimens during the acute phase of infection. Negative results do not preclude SARS-CoV-2 infection, do not rule out co-infections with other pathogens, and should not be used as the sole basis for treatment or other patient management decisions. Negative results must be combined with clinical observations, patient history, and epidemiological information. The expected result is Negative. Fact Sheet for Patients: 03/13/20 Fact Sheet for Healthcare Providers: HairSlick.no This test is not yet approved or cleared by the quierodirigir.com FDA and  has been authorized for detection and/or diagnosis of SARS-CoV-2 by FDA under an Emergency Use Authorization (EUA). This EUA will remain  in effect (meaning this test can be used) for the duration of the COVID-19 declaration under Section 56 4(b)(1) of the Act, 21 U.S.C. section 360bbb-3(b)(1), unless the authorization is terminated or revoked sooner. Performed at [facility], [address], [place], [place]      Labs: Basic Metabolic Panel: Recent Labs  Lab 01/12/20 0507 01/12/20 1822 01/14/20 0306 01/15/20 0329  NA 135  --  140 139  K 3.7  --  3.5 3.8  CL 99  --  100 102  CO2 23  --  27 27  GLUCOSE  130*  --  105* 106*  BUN 20  --  11 12  CREATININE 0.64  --  0.91 0.89  CALCIUM 9.1  --  9.1 8.6*  MG  --  2.0  --    --   PHOS  --  3.4  --   --    Liver Function Tests: Recent Labs  Lab 01/12/20 0507  AST 23  ALT 20  ALKPHOS 66  BILITOT 0.8  PROT 6.8  ALBUMIN 4.1   CBC: Recent Labs  Lab 01/12/20 0507 01/13/20 0340 01/14/20 0306 01/15/20 0329  WBC 12.7* 8.8 11.4* 8.6  NEUTROABS 10.7*  --   --   --   HGB 12.3 11.7* 12.5 12.3  HCT 37.8 36.3 39.2 37.6  MCV 88.1 89.2 87.9 87.4  PLT 274 208 234 236   CBG: No results for input(s): GLUCAP in the last 168 hours. Hgb A1c No results for input(s): HGBA1C in the last 72 hours. Lipid Profile No results for input(s): CHOL, HDL, LDLCALC, TRIG, CHOLHDL, LDLDIRECT in the last 72 hours. Thyroid function studies Recent Labs    01/12/20 1822  TSH 1.375   Urinalysis    Component Value Date/Time   COLORURINE YELLOW 01/12/2020 0447   APPEARANCEUR CLEAR 01/12/2020 0447   LABSPEC 1.020 01/12/2020 0447   PHURINE 7.5 01/12/2020 0447   GLUCOSEU NEGATIVE 01/12/2020 0447   HGBUR NEGATIVE 01/12/2020 0447   BILIRUBINUR NEGATIVE 01/12/2020 0447   KETONESUR NEGATIVE 01/12/2020 0447   PROTEINUR NEGATIVE 01/12/2020 0447   NITRITE NEGATIVE 01/12/2020 0447   LEUKOCYTESUR NEGATIVE 01/12/2020 0447    FURTHER DISCHARGE INSTRUCTIONS:   Get Medicines reviewed and adjusted: Please take all your medications with you for your next visit with your Primary MD   Laboratory/radiological data: Please request your Primary MD to go over all hospital tests and procedure/radiological results at the follow up, please ask your Primary MD to get all Hospital records sent to his/her office.   In some cases, they will be blood work, cultures and biopsy results pending at the time of your discharge. Please request that your primary care M.D. goes through all the records of your hospital data and follows up on these results.   Also Note the following: If you experience worsening of your admission symptoms, develop shortness of breath, life threatening emergency, suicidal or  homicidal thoughts you must seek medical attention immediately by calling 911 or calling your MD immediately  if symptoms less severe.   You must read complete instructions/literature along with all the possible adverse reactions/side effects for all the Medicines you take and that have been prescribed to you. Take any new Medicines after you have completely understood and accpet all the possible adverse reactions/side effects.    Do not drive when taking Pain medications or sleeping medications (Benzodaizepines)   Do not take more than prescribed Pain, Sleep and Anxiety Medications. It is not advisable to combine anxiety,sleep and pain medications without talking with your primary care practitioner   Special Instructions: If you have smoked or chewed Tobacco  in the last 2 yrs please stop smoking, stop any regular Alcohol  and or any Recreational drug use.   Wear Seat belts while driving.   Please note: You were cared for by a hospitalist during your hospital stay. Once you are discharged, your primary care physician will handle any further medical issues. Please note that NO REFILLS for any discharge medications will be authorized once you are discharged, as it is imperative that  you return to your primary care physician (or establish a relationship with a primary care physician if you do not have one) for your post hospital discharge needs so that they can reassess your need for medications and monitor your lab values.  Time coordinating discharge: 35 minutes  SIGNED:  Marzetta Board, MD, PhD 01/15/2020, 11:12 AM

## 2020-01-15 NOTE — Care Management (Signed)
Patient provided with 30 day Eliquis card. She verbalized understanding instructions for use. No other CM needs identified.

## 2020-01-15 NOTE — Discharge Instructions (Signed)

## 2020-01-15 NOTE — Progress Notes (Signed)
Progress Note  Patient Name: Allison Richardson Date of Encounter: 01/15/2020  Primary Cardiologist: Olga Millers, MD   Subjective   No CP or dyspnea  Inpatient Medications    Scheduled Meds: . apixaban  5 mg Oral BID  . atorvastatin  20 mg Oral QHS  . hydrochlorothiazide  12.5 mg Oral Daily  . losartan  50 mg Oral BID  . nebivolol  5 mg Oral BID  . pantoprazole  40 mg Oral Daily   Continuous Infusions:  PRN Meds: acetaminophen, HYDROmorphone (DILAUDID) injection, ondansetron **OR** ondansetron (ZOFRAN) IV   Vital Signs    Vitals:   01/14/20 2325 01/15/20 0640 01/15/20 0655 01/15/20 0816  BP: 130/72 134/84  118/70  Pulse: 77   70  Resp: 18 12  18   Temp: 97.8 F (36.6 C) 98 F (36.7 C)  98.4 F (36.9 C)  TempSrc: Oral Oral  Oral  SpO2: 97% 100%  97%  Weight:   82 kg   Height:       No intake or output data in the 24 hours ending 01/15/20 0918 Last 3 Weights 01/15/2020 01/14/2020 01/13/2020  Weight (lbs) 180 lb 12.8 oz 183 lb 6.8 oz 184 lb 12.8 oz  Weight (kg) 82.01 kg 83.2 kg 83.825 kg      Telemetry    Atrial fibrillation rate controlled- Personally Reviewed   Physical Exam   GEN: No acute distress.  WD WN Neck: No JVD, supple Cardiac: irregular, normal rate Respiratory: Clear to auscultation bilaterally; no wheeze GI: Soft, NT/ND MS: No edema Neuro:  Grossly intact Psych: Normal affect   Labs    Chemistry Recent Labs  Lab 01/12/20 0507 01/14/20 0306 01/15/20 0329  NA 135 140 139  K 3.7 3.5 3.8  CL 99 100 102  CO2 23 27 27   GLUCOSE 130* 105* 106*  BUN 20 11 12   CREATININE 0.64 0.91 0.89  CALCIUM 9.1 9.1 8.6*  PROT 6.8  --   --   ALBUMIN 4.1  --   --   AST 23  --   --   ALT 20  --   --   ALKPHOS 66  --   --   BILITOT 0.8  --   --   GFRNONAA >60 >60 >60  GFRAA >60 >60 >60  ANIONGAP 13 13 10      Hematology Recent Labs  Lab 01/13/20 0340 01/14/20 0306 01/15/20 0329  WBC 8.8 11.4* 8.6  RBC 4.07 4.46 4.30  HGB 11.7* 12.5 12.3  HCT  36.3 39.2 37.6  MCV 89.2 87.9 87.4  MCH 28.7 28.0 28.6  MCHC 32.2 31.9 32.7  RDW 12.8 12.6 12.6  PLT 208 234 236    Radiology    ECHOCARDIOGRAM COMPLETE  Result Date: 01/13/2020    ECHOCARDIOGRAM REPORT   Patient Name:   DANNON PERLOW Date of Exam: 01/13/2020 Medical Rec #:  03/16/20   Height:       66.0 in Accession #:    03/14/2020  Weight:       184.8 lb Date of Birth:  [birth year]    BSA:          1.934 m Patient Age:    74 years    BP:           128/69 mmHg Patient Gender: F           HR:           67 bpm. Exam Location:  Inpatient Procedure: 2D Echo Indications:  atrial fibrillation 427.31  History:        Patient has prior history of Echocardiogram examinations, most                 recent 12/06/2017. Arrythmias:Atrial Fibrillation; Risk                 Factors:Hypertension and Dyslipidemia.  Sonographer:    Delcie Roch Referring Phys: [id number] ROBERT SCHERTZ IMPRESSIONS  1. Normal LV function; mild LVH; mild MR; biatrial enlargement; mild TR.  2. Left ventricular ejection fraction, by estimation, is 60 to 65%. The left ventricle has normal function. The left ventricle has no regional wall motion abnormalities. There is mild left ventricular hypertrophy. Left ventricular diastolic function could not be evaluated.  3. Right ventricular systolic function is normal. The right ventricular size is normal. There is mildly elevated pulmonary artery systolic pressure.  4. Left atrial size was mild to moderately dilated.  5. Right atrial size was mildly dilated.  6. The mitral valve is normal in structure. Mild mitral valve regurgitation. No evidence of mitral stenosis.  7. The aortic valve is tricuspid. Aortic valve regurgitation is not visualized. Mild aortic valve sclerosis is present, with no evidence of aortic valve stenosis.  8. The inferior vena cava is normal in size with greater than 50% respiratory variability, suggesting right atrial pressure of 3 mmHg. FINDINGS  Left Ventricle: Left ventricular  ejection fraction, by estimation, is 60 to 65%. The left ventricle has normal function. The left ventricle has no regional wall motion abnormalities. The left ventricular internal cavity size was normal in size. There is  mild left ventricular hypertrophy. Left ventricular diastolic function could not be evaluated due to atrial fibrillation. Left ventricular diastolic function could not be evaluated. Right Ventricle: The right ventricular size is normal. Right ventricular systolic function is normal. There is mildly elevated pulmonary artery systolic pressure. The tricuspid regurgitant velocity is 2.80 m/s, and with an assumed right atrial pressure of 3 mmHg, the estimated right ventricular systolic pressure is 34.4 mmHg. Left Atrium: Left atrial size was mild to moderately dilated. Right Atrium: Right atrial size was mildly dilated. Pericardium: There is no evidence of pericardial effusion. Mitral Valve: The mitral valve is normal in structure. Normal mobility of the mitral valve leaflets. Mild mitral annular calcification. Mild mitral valve regurgitation. No evidence of mitral valve stenosis. Tricuspid Valve: The tricuspid valve is normal in structure. Tricuspid valve regurgitation is mild . No evidence of tricuspid stenosis. Aortic Valve: The aortic valve is tricuspid. Aortic valve regurgitation is not visualized. Mild aortic valve sclerosis is present, with no evidence of aortic valve stenosis. Pulmonic Valve: The pulmonic valve was normal in structure. Pulmonic valve regurgitation is trivial. No evidence of pulmonic stenosis. Aorta: The aortic root is normal in size and structure. Venous: The inferior vena cava is normal in size with greater than 50% respiratory variability, suggesting right atrial pressure of 3 mmHg.  Additional Comments: Normal LV function; mild LVH; mild MR; biatrial enlargement; mild TR.  LEFT VENTRICLE PLAX 2D LVIDd:         4.20 cm LVIDs:         2.90 cm LV PW:         0.90 cm LV IVS:         1.00 cm LVOT diam:     1.80 cm LV SV:         63 LV SV Index:   33 LVOT Area:     2.54 cm  RIGHT VENTRICLE RV S prime:     9.46 cm/s TAPSE (M-mode): 1.5 cm LEFT ATRIUM             Index       RIGHT ATRIUM           Index LA diam:        4.70 cm 2.43 cm/m  RA Area:     19.20 cm LA Vol (A2C):   75.9 ml 39.25 ml/m RA Volume:   54.60 ml  28.24 ml/m LA Vol (A4C):   68.7 ml 35.53 ml/m LA Biplane Vol: 72.2 ml 37.34 ml/m  AORTIC VALVE LVOT Vmax:   116.00 cm/s LVOT Vmean:  78.200 cm/s LVOT VTI:    0.249 m  AORTA Ao Root diam: 2.70 cm TRICUSPID VALVE TR Peak grad:   31.4 mmHg TR Vmax:        280.00 cm/s  SHUNTS Systemic VTI:  0.25 m Systemic Diam: 1.80 cm Kirk Ruths MD Electronically signed by Kirk Ruths MD Signature Date/Time: 01/13/2020/3:45:37 PM    Final     Patient Profile     74 y.o. female with past medical history of hypertension and cerebral aneurysm admitted with new onset atrial fibrillation and splenic infarct.  Echocardiogram shows normal LV function, mild left ventricular hypertrophy, mild mitral regurgitation, biatrial enlargement and mild tricuspid regurgitation.  Assessment & Plan    1 persistent atrial fibrillation-patient's heart rate appears to be better controlled today.  We will continue Bystolic 5 mg twice daily.  Continue apixaban.  Patient has an appointment to see me in the office in May.  At that time we will discuss rate control versus rhythm control.  She does have some fatigue over the past 3 weeks and mild dyspnea on exertion.  It would likely be worthwhile attempting to reestablish sinus rhythm to see if her symptoms improve.  2 hypertension-blood pressure controlled.  Continue present medications.  3 history of TIA-discontinue aspirin given need for apixaban.  4 hyperlipidemia-continue statin.  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  Patient can be discharged from a cardiac standpoint on present medications.  Follow-up with me in the  office as scheduled.  For questions or updates, please contact [place] Please consult [email or web address] for contact info under        Signed, Kirk Ruths, MD  01/15/2020, 9:18 AM

## 2020-02-06 DIAGNOSIS — I10 Essential (primary) hypertension: Secondary | ICD-10-CM

## 2020-02-06 DIAGNOSIS — G459 Transient cerebral ischemic attack, unspecified: Secondary | ICD-10-CM

## 2020-02-23 LAB — BASIC METABOLIC PANEL
BUN/Creatinine Ratio: 17 (ref 12–28)
BUN: 16 mg/dL (ref 8–27)
CO2: 24 mmol/L (ref 20–29)
Calcium: 9.4 mg/dL (ref 8.7–10.3)
Chloride: 101 mmol/L (ref 96–106)
Creatinine, Ser: 0.93 mg/dL (ref 0.57–1.00)
GFR calc Af Amer: 70 mL/min/{1.73_m2} (ref >59)
GFR calc non Af Amer: 61 mL/min/{1.73_m2} (ref >59)
Glucose: 95 mg/dL (ref 65–99)
Potassium: 4.6 mmol/L (ref 3.5–5.2)
Sodium: 141 mmol/L (ref 134–144)

## 2020-02-23 LAB — CBC
Hematocrit: 39.3 % (ref 34.0–46.6)
Hemoglobin: 12.9 g/dL (ref 11.1–15.9)
MCH: 28.5 pg (ref 26.6–33.0)
MCHC: 32.8 g/dL (ref 31.5–35.7)
MCV: 87 fL (ref 79–97)
Platelets: 313 10*3/uL (ref 150–450)
RBC: 4.52 x10E6/uL (ref 3.77–5.28)
RDW: 13 % (ref 11.7–15.4)
WBC: 6.6 10*3/uL (ref 3.4–10.8)

## 2020-02-23 NOTE — Progress Notes (Signed)
HPI: FUatrial fibrillation and hypertension. MRA February 2019 showed no significant stenosis. 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 Also h/o TIA.  Renal Dopplers December 2020 showed no renal artery stenosis.  Echocardiogram April 2021 showed normal LV function, mild left ventricular hypertrophy, biatrial enlargement, mild mitral vegetation and mild tricuspid regurgitation.  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  Plan was rate control until follow-up in the office and then discussion could be had concerning rate control versus rhythm control.  It was noted she had some fatigue and rhythm control would likely be best option.  Since last seen,she denies dyspnea, chest pain, palpitations or syncope.  No bleeding.  She does note increased fatigue.  Current Outpatient Medications  Medication Sig Dispense Refill  . amLODipine (NORVASC) 2.5 MG tablet Take 2.5 mg by mouth at bedtime.    Marland Kitchen apixaban (ELIQUIS) 5 MG TABS tablet Take 1 tablet (5 mg total) by mouth 2 (two) times daily. 60 tablet 1  . ascorbic acid (VITAMIN C) 500 MG tablet Take 500 mg by mouth daily.    Marland Kitchen atorvastatin (LIPITOR) 10 MG tablet Take 20 mg by mouth at bedtime.     . Cholecalciferol (VITAMIN D-3) 25 MCG (1000 UT) CAPS Take 1,000 Units by mouth daily.    . cyanocobalamin (,VITAMIN B-12,) 1000 MCG/ML injection Inject 1,000 mcg into the muscle every 30 (thirty) days.     . hydrochlorothiazide (HYDRODIURIL) 12.5 MG tablet Take 1 tablet (12.5 mg total) by mouth daily. 90 tablet 1  . losartan (COZAAR) 50 MG tablet Take 1 tablet (50 mg total) by mouth 2 (two) times daily. 180 tablet 3  . nebivolol (BYSTOLIC) 5 MG tablet Take 1 tablet (5 mg total) by mouth 2 (two) times daily. 60 tablet 0  . omeprazole (PRILOSEC) 40 MG capsule Take 1 capsule (40 mg total) by mouth daily. (Patient taking  differently: Take 40 mg by mouth every evening. ) 90 capsule 3  . Probiotic Product (DIGESTIVE ADV+BOWEL SUPPORT) CAPS Take 1 capsule by mouth daily.     . Wheat Dextrin (BENEFIBER DRINK MIX PO) Take by mouth See admin instructions. Mix 1 teaspoonful of powder into 6-8 ounces of coffee and drink in the morning (once a day)     No current facility-administered medications for this visit.     Past Medical History:  Diagnosis Date  . GERD (gastroesophageal reflux disease)   . Hypercholesteremia   . Hyperlipidemia 12/05/2017  . Hypertension   . Hypothyroid   . Lyme disease   . Osteopenia after menopause 05/14/2018   DEXA 04/2018, [facility]: Impression- Osteopenia   T Scores  R Femur Neck- -0.70 L Femur Neck- -1.20 R Total Femur- -0.40 L Total Femur- -1.50 AP Total Spine: -0.30   . SVT (supraventricular tachycardia) ([place])   . TIA (transient ischemic attack) 12/05/2017  . TIA (transient ischemic attack)     Past Surgical History:  Procedure Laterality Date  . ABDOMINAL HYSTERECTOMY    . APPENDECTOMY    . BUNIONECTOMY Left   . IR RADIOLOGIST EVAL & MGMT  01/05/2018    Social History   Socioeconomic History  . Marital status: Married    Spouse name: Not on file  . Number of children: 0  . Years of education: 18-20  . Highest education level: Not on file  Occupational History  . Occupation: Retired  Tobacco Use  . Smoking status: Never Smoker  . Smokeless tobacco: Never Used  Substance and Sexual Activity  . Alcohol use: No  . Drug use: No  . Sexual activity: Yes  Other Topics Concern  . Not on file  Social History Narrative   Lives with husband   Caffeine use: Coffee daily   Right handed   Social Determinants of Health   Financial Resource Strain:   . Difficulty of Paying Living Expenses:   Food Insecurity:   . Worried About Programme researcher, broadcasting/film/video in the Last Year:   . Barista in the Last Year:   Transportation Needs:   . Freight forwarder (Medical):   Marland Kitchen Lack  of Transportation (Non-Medical):   Physical Activity:   . Days of Exercise per Week:   . Minutes of Exercise per Session:   Stress:   . Feeling of Stress :   Social Connections:   . Frequency of Communication with Friends and Family:   . Frequency of Social Gatherings with Friends and Family:   . Attends Religious Services:   . Active Member of Clubs or Organizations:   . Attends Banker Meetings:   Marland Kitchen Marital Status:   Intimate Partner Violence:   . Fear of Current or Ex-Partner:   . Emotionally Abused:   Marland Kitchen Physically Abused:   . Sexually Abused:     Family History  Problem Relation Age of Onset  . Stroke Mother   . High blood pressure Mother   . High Cholesterol Mother   . Stroke Father   . High blood pressure Father   . High Cholesterol Father   . Cancer Father   . Heart attack Maternal Grandfather   . Heart disease Maternal Grandfather   . Stroke Maternal Grandfather   . Early death Paternal Grandmother   . Heart attack Paternal Grandmother   . Heart disease Paternal Grandmother   . Heart attack Paternal Grandfather   . Heart disease Paternal Grandfather     ROS: no fevers or chills, productive cough, hemoptysis, dysphasia, odynophagia, melena, hematochezia, dysuria, hematuria, rash, seizure activity, orthopnea, PND, pedal edema, claudication. Remaining systems are negative.  Physical Exam: Well-developed well-nourished in no acute distress.  Skin is warm and dry.  HEENT is normal.  Neck is supple.  Chest is clear to auscultation with normal expansion.  Cardiovascular exam is irregular Abdominal exam nontender or distended. No masses palpated. Extremities show no edema. neuro grossly intact  Electrocardiogram today shows atrial fibrillation with controlled ventricular response.  Personally reviewed.  A/P  1 persistent atrial fibrillation-plan to continue Bystolic and apixaban.  She is having increased fatigue which may be related to her atrial  fibrillation.  I think we should try to reestablish sinus rhythm.  Given that she had a recent embolic event I will arrange TEE prior to cardioversion.  Hopefully she will hold sinus rhythm and her symptoms will improve.  If she then developed recurrent atrial fibrillation would need to consider antiarrhythmic versus referral for ablation.  She will need lifelong anticoagulation given previous embolic event.  2 hypertension-patient's blood pressure is controlled today.  Continue present medical regimen.  3 hyperlipidemia-continue statin.  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  5 history of cerebral aneurysm-followed by neurology and interventional radiology.  Olga Millers, MD

## 2020-02-27 ENCOUNTER — Telehealth: Payer: Self-pay | Admitting: Cardiology

## 2020-02-27 NOTE — Telephone Encounter (Signed)
Patient would like her husband to come with her to her appt on 02/29/20 with Dr. Jens Som. She states that she has a new diagnosis and her husband has some questions. She states that they both have been vaccinated.

## 2020-02-27 NOTE — Telephone Encounter (Signed)
Spoke to patient advised ok for husband to come back at her appointment with Dr.Crenshaw.

## 2020-02-29 ENCOUNTER — Other Ambulatory Visit: Payer: Self-pay

## 2020-02-29 ENCOUNTER — Encounter: Payer: Self-pay | Admitting: Cardiology

## 2020-02-29 ENCOUNTER — Ambulatory Visit (INDEPENDENT_AMBULATORY_CARE_PROVIDER_SITE_OTHER): Payer: Medicare Other | Admitting: Cardiology

## 2020-02-29 VITALS — BP 116/70 | HR 74 | Ht 66.5 in | Wt 186.8 lb

## 2020-02-29 DIAGNOSIS — E7800 Pure hypercholesterolemia, unspecified: Secondary | ICD-10-CM | POA: Diagnosis not present

## 2020-02-29 DIAGNOSIS — I4819 Other persistent atrial fibrillation: Secondary | ICD-10-CM

## 2020-02-29 DIAGNOSIS — I10 Essential (primary) hypertension: Secondary | ICD-10-CM | POA: Diagnosis not present

## 2020-02-29 NOTE — Patient Instructions (Addendum)
Medication Instructions:  NO CHANGE *If you need a refill on your cardiac medications before your next appointment, please call your pharmacy*   Lab Work: If you have labs (blood work) drawn today and your tests are completely normal, you will receive your results only by: Marland Kitchen MyChart Message (if you have MyChart) OR . A paper copy in the mail If you have any lab test that is abnormal or we need to change your treatment, we will call you to review the results.   Testing/Procedures:  You are scheduled for aTEE Cardioversion on Thursday 03/08/20 with Dr. Jens Som.  Please arrive at the [facility] (Main Entrance A) at [facility]: [address] [place], [place] [address] at 8 am. (1 hour prior to procedure unless lab work is needed; if lab work is needed arrive 1.5 hours ahead)  DIET: Nothing to eat or drink after midnight except a sip of water with medications (see medication instructions below)  GO TO [address] ON Monday 03/05/20 @ 1:20 PM FOR COVID TESTING  Medication Instructions: DO NOT TAKE BYSTOLIC THE MORNING OF THE PROCEDURE  Continue your anticoagulant: ELIQUIS   You must have a responsible person to drive you home and stay in the waiting area during your procedure. Failure to do so could result in cancellation.  Bring your insurance cards.  *Special Note: Every effort is made to have your procedure done on time. Occasionally there are emergencies that occur at the hospital that may cause delays. Please be patient if a delay does occur.    Follow-Up: At [facility], you and your health needs are our priority.  As part of our continuing mission to provide you with exceptional heart care, we have created designated Provider Care Teams.  These Care Teams include your primary Cardiologist (physician) and Advanced Practice Providers (APPs -  Physician Assistants and Nurse Practitioners) who all work together to provide you with the care you need, when you need  it.  We recommend signing up for the patient portal called "MyChart".  Sign up information is provided on this After Visit Summary.  MyChart is used to connect with patients for Virtual Visits (Telemedicine).  Patients are able to view lab/test results, encounter notes, upcoming appointments, etc.  Non-urgent messages can be sent to your provider as well.   To learn more about what you can do with MyChart, go to [email or web address].    Your next appointment:   3 month(s)  The format for your next appointment:   In Person  Provider:   Olga Millers, MD

## 2020-03-05 ENCOUNTER — Other Ambulatory Visit (HOSPITAL_COMMUNITY)
Admission: RE | Admit: 2020-03-05 | Discharge: 2020-03-05 | Disposition: A | Discharge status: Home / Self Care | Payer: Medicare Other | Source: Ambulatory Visit | Attending: Cardiology | Admitting: Cardiology

## 2020-03-05 DIAGNOSIS — Z20822 Contact with and (suspected) exposure to covid-19: Secondary | ICD-10-CM | POA: Diagnosis not present

## 2020-03-05 DIAGNOSIS — Z01812 Encounter for preprocedural laboratory examination: Secondary | ICD-10-CM | POA: Insufficient documentation

## 2020-03-05 LAB — SARS CORONAVIRUS 2 (TAT 6-24 HRS): SARS Coronavirus 2: NEGATIVE

## 2020-03-08 ENCOUNTER — Ambulatory Visit (HOSPITAL_COMMUNITY): Payer: Medicare Other | Admitting: Certified Registered Nurse Anesthetist

## 2020-03-08 ENCOUNTER — Ambulatory Visit (HOSPITAL_COMMUNITY)
Admission: RE | Admit: 2020-03-08 | Discharge: 2020-03-08 | Disposition: A | Discharge status: Home / Self Care | Payer: Medicare Other | Attending: Cardiology | Admitting: Cardiology

## 2020-03-08 ENCOUNTER — Encounter (HOSPITAL_COMMUNITY): Payer: Self-pay | Admitting: Cardiology

## 2020-03-08 ENCOUNTER — Ambulatory Visit (HOSPITAL_BASED_OUTPATIENT_CLINIC_OR_DEPARTMENT_OTHER)
Admission: RE | Admit: 2020-03-08 | Discharge: 2020-03-08 | Disposition: A | Discharge status: Home / Self Care | Payer: Medicare Other | Source: Ambulatory Visit | Attending: Cardiology | Admitting: Cardiology

## 2020-03-08 ENCOUNTER — Other Ambulatory Visit: Payer: Self-pay

## 2020-03-08 ENCOUNTER — Encounter (HOSPITAL_COMMUNITY)
Admission: RE | Disposition: A | Discharge status: Home / Self Care | Payer: Medicare Other | Source: Home / Self Care | Attending: Cardiology

## 2020-03-08 DIAGNOSIS — E039 Hypothyroidism, unspecified: Secondary | ICD-10-CM | POA: Insufficient documentation

## 2020-03-08 DIAGNOSIS — I340 Nonrheumatic mitral (valve) insufficiency: Secondary | ICD-10-CM | POA: Diagnosis not present

## 2020-03-08 DIAGNOSIS — Z79899 Other long term (current) drug therapy: Secondary | ICD-10-CM | POA: Insufficient documentation

## 2020-03-08 DIAGNOSIS — Z823 Family history of stroke: Secondary | ICD-10-CM | POA: Insufficient documentation

## 2020-03-08 DIAGNOSIS — I4891 Unspecified atrial fibrillation: Secondary | ICD-10-CM | POA: Diagnosis not present

## 2020-03-08 DIAGNOSIS — I088 Other rheumatic multiple valve diseases: Secondary | ICD-10-CM | POA: Insufficient documentation

## 2020-03-08 DIAGNOSIS — E785 Hyperlipidemia, unspecified: Secondary | ICD-10-CM | POA: Diagnosis not present

## 2020-03-08 DIAGNOSIS — Z8673 Personal history of transient ischemic attack (TIA), and cerebral infarction without residual deficits: Secondary | ICD-10-CM | POA: Insufficient documentation

## 2020-03-08 DIAGNOSIS — M8580 Other specified disorders of bone density and structure, unspecified site: Secondary | ICD-10-CM | POA: Diagnosis not present

## 2020-03-08 DIAGNOSIS — I4819 Other persistent atrial fibrillation: Secondary | ICD-10-CM | POA: Insufficient documentation

## 2020-03-08 DIAGNOSIS — I671 Cerebral aneurysm, nonruptured: Secondary | ICD-10-CM | POA: Diagnosis not present

## 2020-03-08 DIAGNOSIS — E7800 Pure hypercholesterolemia, unspecified: Secondary | ICD-10-CM | POA: Insufficient documentation

## 2020-03-08 DIAGNOSIS — Z8249 Family history of ischemic heart disease and other diseases of the circulatory system: Secondary | ICD-10-CM | POA: Insufficient documentation

## 2020-03-08 DIAGNOSIS — K219 Gastro-esophageal reflux disease without esophagitis: Secondary | ICD-10-CM | POA: Diagnosis not present

## 2020-03-08 DIAGNOSIS — I471 Supraventricular tachycardia: Secondary | ICD-10-CM | POA: Insufficient documentation

## 2020-03-08 DIAGNOSIS — Z7901 Long term (current) use of anticoagulants: Secondary | ICD-10-CM | POA: Diagnosis not present

## 2020-03-08 DIAGNOSIS — I119 Hypertensive heart disease without heart failure: Secondary | ICD-10-CM | POA: Insufficient documentation

## 2020-03-08 HISTORY — PX: TEE WITHOUT CARDIOVERSION: SHX5443

## 2020-03-08 HISTORY — PX: CARDIOVERSION: SHX1299

## 2020-03-08 LAB — POCT I-STAT, CHEM 8
BUN: 24 mg/dL — ABNORMAL HIGH (ref 8–23)
Calcium, Ion: 0.92 mmol/L — ABNORMAL LOW (ref 1.15–1.40)
Chloride: 105 mmol/L (ref 98–111)
Creatinine, Ser: 0.9 mg/dL (ref 0.44–1.00)
Glucose, Bld: 104 mg/dL — ABNORMAL HIGH (ref 70–99)
HCT: 42 % (ref 36.0–46.0)
Hemoglobin: 14.3 g/dL (ref 12.0–15.0)
Potassium: 5.5 mmol/L — ABNORMAL HIGH (ref 3.5–5.1)
Sodium: 136 mmol/L (ref 135–145)
TCO2: 28 mmol/L (ref 22–32)

## 2020-03-08 SURGERY — ECHOCARDIOGRAM, TRANSESOPHAGEAL
Anesthesia: General

## 2020-03-08 MED ORDER — PROPOFOL 500 MG/50ML IV EMUL
INTRAVENOUS | Status: DC | PRN
  Administered 2020-03-08: 120 ug/kg/min via INTRAVENOUS

## 2020-03-08 MED ORDER — SODIUM CHLORIDE 0.9 % IV SOLN: INTRAVENOUS | Status: DC

## 2020-03-08 MED ORDER — PROPOFOL 10 MG/ML IV BOLUS
INTRAVENOUS | Status: DC | PRN
  Administered 2020-03-08: 20 mg via INTRAVENOUS
  Administered 2020-03-08: 10 mg via INTRAVENOUS

## 2020-03-08 MED ORDER — EPHEDRINE SULFATE-NACL 50-0.9 MG/10ML-% IV SOSY
PREFILLED_SYRINGE | INTRAVENOUS | Status: DC | PRN
  Administered 2020-03-08: 5 mg via INTRAVENOUS

## 2020-03-08 MED ORDER — BUTAMBEN-TETRACAINE-BENZOCAINE 2-2-14 % EX AERO
INHALATION_SPRAY | CUTANEOUS | Status: DC | PRN
  Administered 2020-03-08: 2 via TOPICAL

## 2020-03-08 NOTE — Interval H&P Note (Signed)
History and Physical Interval Note:  03/08/2020 7:32 AM  Allison Richardson  has presented today for surgery, with the diagnosis of AFIB.  The various methods of treatment have been discussed with the patient and family. After consideration of risks, benefits and other options for treatment, the patient has consented to  Procedure(s): TRANSESOPHAGEAL ECHOCARDIOGRAM (TEE) (N/A) CARDIOVERSION (N/A) as a surgical intervention.  The patient's history has been reviewed, patient examined, no change in status, stable for surgery.  I have reviewed the patient's chart and labs.  Questions were answered to the patient's satisfaction.     Olga Millers

## 2020-03-08 NOTE — Procedures (Signed)
    Transesophageal Echocardiogram Note  Allison Richardson [id number] 05-17-1946  Procedure: Transesophageal Echocardiogram Indications: atrial fibrillation   Procedure Details Consent: Obtained Time Out: Verified patient identification, verified procedure, site/side was marked, verified correct patient position, special equipment/implants available, Radiology Safety Procedures followed,  medications/allergies/relevent history reviewed, required imaging and test results available.  Performed  Medications:  Pt sedated by anesthesia with diprovan 205 mg IV total.  Normal LV function; no LAA thrombus; pt subsequently had DCCV with 200 J to sinus bradycardia.   Complications: No apparent complications Patient did tolerate procedure well.  Olga Millers, MD

## 2020-03-08 NOTE — H&P (Signed)
Office Visit   Go to Cards  02/29/2020  [facility] [place]     Photo of Livingston, Madolyn Frieze, MD       Lewayne Bunting, MD   Cardiology        Persistent atrial fibrillation [facility]) +2 more   Dx        Follow-up . Atrial Fibrillation . Hypertension; Referred by Nadara Eaton, MD   Reason for Visit        Additional Documentation   Vitals:       BP 116/70       Pulse 74       Ht 5' 6.5" (1.689 m)       Wt 84.7 kg       SpO2 97%       BMI 29.70 kg/m       BSA 1.99 m    Flowsheets:        NEWS,       MEWS Score,       Anthropometrics     Encounter Info:        Billing Info,       History,       Allergies,       Detailed Report            All Notes      Progress Notes by Lewayne Bunting, MD at 02/29/2020 9:40 AM   Author: Lewayne Bunting, MD Author Type: Physician Filed: 02/29/2020 10:34 AM  Note Status: Signed Cosign: Cosign Not Required Encounter Date: 02/29/2020  Editor: Lewayne Bunting, MD (Physician)              untitled image              HPI: FU atrial fibrillation and hypertension. MRA February 2019 showed no significant stenosis.  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  Seen by interventional radiology and options included arteriogram versus watchful waiting. Also h/o TIA.  Renal Dopplers December 2020 showed no renal artery stenosis.  Echocardiogram April 2021 showed normal LV function, mild left ventricular hypertrophy, biatrial enlargement, mild mitral vegetation and mild tricuspid regurgitation.  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  Plan was rate control until follow-up in the office and then discussion could be had concerning rate control versus rhythm control.  It was noted she had some fatigue and rhythm control would likely be best option.  Since last seen,  she denies dyspnea, chest pain, palpitations or syncope.  No bleeding.  She does note increased fatigue.            Current Outpatient Medications    Medication   Sig   Dispense   Refill    .   amLODipine (NORVASC) 2.5 MG tablet   Take 2.5 mg by mouth at bedtime.            Marland Kitchen   apixaban (ELIQUIS) 5 MG TABS tablet   Take 1 tablet (5 mg total) by mouth 2 (two) times daily.   60 tablet   1    .   ascorbic acid (VITAMIN C) 500 MG tablet   Take 500 mg by mouth daily.            Marland Kitchen   atorvastatin (LIPITOR) 10 MG tablet   Take 20 mg by mouth at bedtime.             Marland Kitchen  Cholecalciferol (VITAMIN D-3) 25 MCG (1000 UT) CAPS   Take 1,000 Units by mouth daily.            .   cyanocobalamin (,VITAMIN B-12,) 1000 MCG/ML injection   Inject 1,000 mcg into the muscle every 30 (thirty) days.             .   hydrochlorothiazide (HYDRODIURIL) 12.5 MG tablet   Take 1 tablet (12.5 mg total) by mouth daily.   90 tablet   1    .   losartan (COZAAR) 50 MG tablet   Take 1 tablet (50 mg total) by mouth 2 (two) times daily.   180 tablet   3    .   nebivolol (BYSTOLIC) 5 MG tablet   Take 1 tablet (5 mg total) by mouth 2 (two) times daily.   60 tablet   0    .   omeprazole (PRILOSEC) 40 MG capsule   Take 1 capsule (40 mg total) by mouth daily. (Patient taking differently: Take 40 mg by mouth every evening. )   90 capsule   3    .   Probiotic Product (DIGESTIVE ADV+BOWEL SUPPORT) CAPS   Take 1 capsule by mouth daily.             .   Wheat Dextrin (BENEFIBER DRINK MIX PO)   Take by mouth See admin instructions. Mix 1 teaspoonful of powder into 6-8 ounces of coffee and drink in the morning (once a day)                No current facility-administered medications for this visit.                  Past Medical History:    Diagnosis   Date    .   GERD (gastroesophageal reflux disease)         .   Hypercholesteremia        .   Hyperlipidemia   12/05/2017    .   Hypertension        .   Hypothyroid        .   Lyme disease        .   Osteopenia after menopause   05/14/2018        DEXA 04/2018, [facility]: Impression- Osteopenia   T Scores  R Femur Neck- -0.70 L Femur Neck- -1.20 R Total Femur- -0.40 L Total Femur- -1.50 AP Total Spine: -0.30     .   SVT (supraventricular tachycardia) (HCC)        .   TIA (transient ischemic attack)   12/05/2017    .   TIA (transient ischemic attack)                    Past Surgical History:    Procedure   Laterality   Date    .   ABDOMINAL HYSTERECTOMY            .   APPENDECTOMY            .   BUNIONECTOMY   Left        .   IR RADIOLOGIST EVAL & MGMT       01/05/2018           Social History             Socioeconomic History    .   Marital status:   Married  Spouse name:   Not on file    .   Number of children:   0    .   Years of education:   18-20    .   Highest education level:   Not on file    Occupational History    .   Occupation:   Retired    Tobacco Use    .   Smoking status:   Never Smoker    .   Smokeless tobacco:   Never Used    Substance and Sexual Activity    .   Alcohol use:   No    .   Drug use:   No    .   Sexual activity:   Yes    Other Topics   Concern    .   Not on file    Social History Narrative        Lives with husband        Caffeine use: Coffee daily        Right handed        Social Determinants of Health           Financial Resource Strain:     .   Difficulty of Paying Living Expenses:     Food Insecurity:     .   Worried About Charity fundraiser in the Last Year:     .   Arboriculturist in the Last Year:     Transportation Needs:     .   Lexicographer (Medical):     Marland Kitchen   Lack of Transportation (Non-Medical):     Physical Activity:     .   Days of Exercise per Week:     .   Minutes of Exercise per Session:     Stress:     .   Feeling of Stress :     Social Connections:     .   Frequency of Communication with Friends and Family:     .   Frequency of Social Gatherings with Friends and Family:     .   Attends Religious Services:     .   Active Member of Clubs or Organizations:     .   Attends Archivist Meetings:     Marland Kitchen   Marital Status:     Intimate Partner Violence:     .   Fear of Current or Ex-Partner:     .   Emotionally Abused:     Marland Kitchen   Physically Abused:     .   Sexually Abused:                 Family History    Problem   Relation   Age of Onset    .   Stroke   Mother        .   High blood pressure   Mother        .   High Cholesterol   Mother        .   Stroke   Father        .   High blood pressure   Father        .   High Cholesterol   Father        .   Cancer   Father        .   Heart attack  Maternal Grandfather        .   Heart disease   Maternal Grandfather        .   Stroke   Maternal Grandfather        .   Early death   Paternal Grandmother        .   Heart attack   Paternal Grandmother        .   Heart disease   Paternal Grandmother        .   Heart attack   Paternal Grandfather        .   Heart disease   Paternal Grandfather              ROS: no fevers or chills, productive cough, hemoptysis, dysphasia, odynophagia, melena, hematochezia, dysuria, hematuria, rash, seizure activity, orthopnea, PND, pedal edema, claudication. Remaining systems are negative.     Physical Exam:  Well-developed well-nourished in no acute distress.   Skin is warm and dry.   HEENT is normal.   Neck is  supple.   Chest is clear to auscultation with normal expansion.   Cardiovascular exam is irregular  Abdominal exam nontender or distended. No masses palpated.  Extremities show no edema.  neuro grossly intact     Electrocardiogram today shows atrial fibrillation with controlled ventricular response.  Personally reviewed.     A/P     1 persistent atrial fibrillation-plan to continue Bystolic and apixaban.  She is having increased fatigue which may be related to her atrial fibrillation.  I think we should try to reestablish sinus rhythm.  Given that she had a recent embolic event I will arrange TEE prior to cardioversion.  Hopefully she will hold sinus rhythm and her symptoms will improve.  If she then developed recurrent atrial fibrillation would need to consider antiarrhythmic versus referral for ablation.  She will need lifelong anticoagulation given previous embolic event.     2 hypertension-patient's blood pressure is controlled today.  Continue present medical regimen.     3 hyperlipidemia-continue statin.     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     5 history of cerebral aneurysm-followed by neurology and interventional radiology.     Olga Millers, MD    For TEE/DCCV; no changes Olga Millers

## 2020-03-08 NOTE — Progress Notes (Signed)
  Echocardiogram Echocardiogram Transesophageal has been performed.  Allison Richardson 03/08/2020, 9:30 AM

## 2020-03-08 NOTE — Transfer of Care (Signed)
Immediate Anesthesia Transfer of Care Note  Patient: Allison Richardson  Procedure(s) Performed: TRANSESOPHAGEAL ECHOCARDIOGRAM (TEE) (N/A ) CARDIOVERSION (N/A )  Patient Location: Endoscopy Unit  Anesthesia Type:MAC  Level of Consciousness: drowsy  Airway & Oxygen Therapy: Patient Spontanous Breathing and Patient connected to nasal cannula oxygen  Post-op Assessment: Report given to RN and Post -op Vital signs reviewed and stable  Post vital signs: Reviewed  Last Vitals:  Vitals Value Taken Time  BP 103/50 03/08/20 0930  Temp    Pulse 53 03/08/20 0931  Resp 18 03/08/20 0931  SpO2 100 % 03/08/20 0931  Vitals shown include unvalidated device data.  Last Pain:  Vitals:   03/08/20 0930  TempSrc:   PainSc: 0-No pain         Complications: No apparent anesthesia complications

## 2020-03-08 NOTE — Anesthesia Preprocedure Evaluation (Addendum)
Anesthesia Evaluation  Patient identified by MRN, date of birth, ID band Patient awake    Reviewed: Allergy & Precautions, NPO status , Patient's Chart, lab work & pertinent test results  Airway Mallampati: III  TM Distance: >3 FB Neck ROM: Full    Dental  (+) Teeth Intact, Dental Advisory Given   Pulmonary neg pulmonary ROS,    Pulmonary exam normal        Cardiovascular hypertension, Pt. on medications and Pt. on home beta blockers  Rhythm:Irregular Rate:Normal     Neuro/Psych  Headaches, TIAnegative psych ROS   GI/Hepatic Neg liver ROS, GERD  Medicated,  Endo/Other  Hypothyroidism   Renal/GU negative Renal ROS     Musculoskeletal negative musculoskeletal ROS (+)   Abdominal Normal abdominal exam  (+)   Peds  Hematology negative hematology ROS (+)   Anesthesia Other Findings   Reproductive/Obstetrics                           Anesthesia Physical Anesthesia Plan  ASA: III  Anesthesia Plan: General   Post-op Pain Management:    Induction: Intravenous  PONV Risk Score and Plan: 0 and Propofol infusion  Airway Management Planned: Natural Airway and Nasal Cannula  Additional Equipment: None  Intra-op Plan:   Post-operative Plan:   Informed Consent: I have reviewed the patients History and Physical, chart, labs and discussed the procedure including the risks, benefits and alternatives for the proposed anesthesia with the patient or authorized representative who has indicated his/her understanding and acceptance.       Plan Discussed with: CRNA  Anesthesia Plan Comments:         Anesthesia Quick Evaluation

## 2020-03-08 NOTE — Anesthesia Postprocedure Evaluation (Signed)
Anesthesia Post Note  Patient: Allison Richardson  Procedure(s) Performed: TRANSESOPHAGEAL ECHOCARDIOGRAM (TEE) (N/A ) CARDIOVERSION (N/A )     Patient location during evaluation: PACU Anesthesia Type: General Level of consciousness: awake and alert Pain management: pain level controlled Vital Signs Assessment: post-procedure vital signs reviewed and stable Respiratory status: spontaneous breathing, nonlabored ventilation, respiratory function stable and patient connected to nasal cannula oxygen Cardiovascular status: blood pressure returned to baseline and stable Postop Assessment: no apparent nausea or vomiting Anesthetic complications: no    Last Vitals:  Vitals:   03/08/20 0950 03/08/20 1000  BP: 99/72 (!) 110/51  Pulse: (!) 39 (!) 45  Resp: 16 14  Temp:    SpO2: 99% 100%    Last Pain:  Vitals:   03/08/20 1000  TempSrc:   PainSc: 0-No pain                 Shelton Silvas

## 2020-03-08 NOTE — Anesthesia Procedure Notes (Signed)
Procedure Name: MAC Date/Time: 03/08/2020 8:58 AM Performed by: Janene Harvey, CRNA Pre-anesthesia Checklist: Patient identified, Emergency Drugs available, Suction available and Patient being monitored Patient Re-evaluated:Patient Re-evaluated prior to induction Oxygen Delivery Method: Nasal cannula Placement Confirmation: positive ETCO2 Dental Injury: Teeth and Oropharynx as per pre-operative assessment

## 2020-03-23 ENCOUNTER — Telehealth: Payer: Self-pay | Admitting: Cardiology

## 2020-03-23 ENCOUNTER — Other Ambulatory Visit: Payer: Self-pay | Admitting: Cardiology

## 2020-03-23 ENCOUNTER — Other Ambulatory Visit: Payer: Self-pay

## 2020-03-23 MED ORDER — APIXABAN 5 MG PO TABS: 5.0000 mg | ORAL_TABLET | Freq: Two times a day (BID) | ORAL | 1 refills | Status: DC

## 2020-03-23 NOTE — Telephone Encounter (Signed)
*  STAT* If patient is at the pharmacy, call can be transferred to refill team.   1. Which medications need to be refilled? (please list name of each medication and dose if known)  apixaban (ELIQUIS) 5 MG TABS tablet  2. Which pharmacy/location (including street and city if local pharmacy) is medication to be sent to? [facility] - [place], [place] - [address]  3. Do they need a 30 day or 90 day supply? 30 day supply   Pharmacy states they did not receive prescription

## 2020-03-23 NOTE — Telephone Encounter (Signed)
Spoke with pt pharmacy, they have received the prescription for eliquis. Patient made aware.

## 2020-05-18 NOTE — Progress Notes (Signed)
HPI: FUatrial fibrillation and hypertension. MRA February 2019 showed no significant stenosis. 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 Also h/o TIA.  Renal Dopplers December 2020 showed no renal artery stenosis.  Echocardiogram April 2021 showed normal LV function, mild left ventricular hypertrophy, biatrial enlargement, mild mitral regurgitation and mild tricuspid regurgitation.  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 TEE May 2021 showed normal LV function, mild mitral regurgitation, moderate left atrial enlargement, no left atrial appendage thrombus and mild tricuspid regurgitation.  Patient subsequently underwent cardioversion successfully to sinus bradycardia.  Since last seen,patient notes mild dyspnea on exertion but no orthopnea, PND, pedal edema, chest pain or syncope.  Minimal fatigue.  Current Outpatient Medications  Medication Sig Dispense Refill   acetaminophen (TYLENOL) 500 MG tablet Take 500 mg by mouth every 6 (six) hours as needed (for pain.).     amLODipine (NORVASC) 2.5 MG tablet Take 2.5 mg by mouth at bedtime.     apixaban (ELIQUIS) 5 MG TABS tablet Take 1 tablet (5 mg total) by mouth 2 (two) times daily. 60 tablet 1   ascorbic acid (VITAMIN C) 500 MG tablet Take 500 mg by mouth daily.     atorvastatin (LIPITOR) 10 MG tablet Take 10 mg by mouth at bedtime.      Cholecalciferol (VITAMIN D-3) 25 MCG (1000 UT) CAPS Take 1,000 Units by mouth daily.     cyanocobalamin (,VITAMIN B-12,) 1000 MCG/ML injection Inject 1,000 mcg into the muscle every 30 (thirty) days.      ELIQUIS 5 MG TABS tablet TAKE ONE (1) TABLET BY MOUTH TWO (2) TIMES DAILY 90 tablet 1   hydrochlorothiazide (HYDRODIURIL) 12.5 MG tablet Take 1 tablet (12.5 mg total) by mouth daily. 90 tablet 1   losartan (COZAAR) 50 MG tablet Take 1 tablet (50 mg total) by mouth  2 (two) times daily. 180 tablet 3   nebivolol (BYSTOLIC) 5 MG tablet Take 1 tablet (5 mg total) by mouth 2 (two) times daily. 60 tablet 0   omeprazole (PRILOSEC) 40 MG capsule Take 1 capsule (40 mg total) by mouth daily. (Patient taking differently: Take 40 mg by mouth every evening. ) 90 capsule 3   Probiotic Product (DIGESTIVE ADVANTAGE PO) Take 1 capsule by mouth daily.     Wheat Dextrin (BENEFIBER DRINK MIX PO) Take 4.2 g by mouth daily. Mix 1 teaspoonful of powder into 6-8 ounces of coffee and drink in the morning (once a day)     No current facility-administered medications for this visit.     Past Medical History:  Diagnosis Date   GERD (gastroesophageal reflux disease)    Hypercholesteremia    Hyperlipidemia 12/05/2017   Hypertension    Hypothyroid    Lyme disease    Osteopenia after menopause 05/14/2018   DEXA 04/2018, [facility]: Impression- Osteopenia   T Scores  R Femur Neck- -0.70 L Femur Neck- -1.20 R Total Femur- -0.40 L Total Femur- -1.50 AP Total Spine: -0.30    SVT (supraventricular tachycardia) (HCC)    TIA (transient ischemic attack) 12/05/2017   TIA (transient ischemic attack)     Past Surgical History:  Procedure Laterality Date   ABDOMINAL HYSTERECTOMY     APPENDECTOMY     BUNIONECTOMY Left    CARDIOVERSION N/A 03/08/2020   Procedure: CARDIOVERSION;  Surgeon: Lewayne Bunting, MD;  Location: [facility] ENDOSCOPY;  Service: Cardiovascular;  Laterality: N/A;   IR RADIOLOGIST EVAL & MGMT  01/05/2018   TEE WITHOUT CARDIOVERSION N/A 03/08/2020   Procedure: TRANSESOPHAGEAL ECHOCARDIOGRAM (TEE);  Surgeon: Lewayne Bunting, MD;  Location[facility] ENDOSCOPY;  Service: Cardiovascular;  Laterality: N/A;    Social History   Socioeconomic History   Marital status: Married    Spouse name: Not on file   Number of children: 0   Years of education: 18-20   Highest education level: Not on file  Occupational History   Occupation: Retired  Tobacco Use   Smoking  status: Never Smoker   Smokeless tobacco: Never Used  Building services engineer Use: Never used  Substance and Sexual Activity   Alcohol use: No   Drug use: No   Sexual activity: Yes  Other Topics Concern   Not on file  Social History Narrative   Lives with husband   Caffeine use: Coffee daily   Right handed   Social Determinants of Health   Financial Resource Strain:    Difficulty of Paying Living Expenses:   Food Insecurity:    Worried About Programme researcher, broadcasting/film/video in the Last Year:    Barista in the Last Year:   Transportation Needs:    Freight forwarder (Medical):    Lack of Transportation (Non-Medical):   Physical Activity:    Days of Exercise per Week:    Minutes of Exercise per Session:   Stress:    Feeling of Stress :   Social Connections:    Frequency of Communication with Friends and Family:    Frequency of Social Gatherings with Friends and Family:    Attends Religious Services:    Active Member of Clubs or Organizations:    Attends Engineer, structural:    Marital Status:   Intimate Partner Violence:    Fear of Current or Ex-Partner:    Emotionally Abused:    Physically Abused:    Sexually Abused:     Family History  Problem Relation Age of Onset   Stroke Mother    High blood pressure Mother    High Cholesterol Mother    Stroke Father    High blood pressure Father    High Cholesterol Father    Cancer Father    Heart attack Maternal Grandfather    Heart disease Maternal Grandfather    Stroke Maternal Grandfather    Early death Paternal Grandmother    Heart attack Paternal Grandmother    Heart disease Paternal Grandmother    Heart attack Paternal Grandfather    Heart disease Paternal Grandfather     ROS: no fevers or chills, productive cough, hemoptysis, dysphasia, odynophagia, melena, hematochezia, dysuria, hematuria, rash, seizure activity, orthopnea, PND, pedal edema, claudication. Remaining  systems are negative.  Physical Exam: Well-developed well-nourished in no acute distress.  Skin is warm and dry.  HEENT is normal.  Neck is supple.  Chest is clear to auscultation with normal expansion.  Cardiovascular exam is irregular Abdominal exam nontender or distended. No masses palpated. Extremities show no edema. neuro grossly intact  ECG-atrial fibrillation at a rate of 68, no ST changes.  Personally reviewed  A/P  1 paroxysmal atrial fibrillation-patient has developed recurrent atrial fibrillation.  We discussed rate control versus rhythm control today.  She does not feel as though her symptoms changed dramatically following her recent cardioversion.  Therefore rate control may be best option.  I will see her back in 6 to 8 weeks and we will  reassess at that time.  If she develops symptoms we will likely refer to the atrial fibrillation clinic, initiate antiarrhythmic such as Tikosyn and proceed with cardioversion.  We will continue Bystolic at present dose.  Continue apixaban.  She will need lifelong anticoagulation given history of previous embolic event.   2 hypertension-blood pressure controlled.  Continue present medications and follow.  3 hyperlipidemia-continue statin.  4 history of splenic infarct/TIA-as outlined above patient will require lifelong anticoagulation.  5 history of cerebral aneurysm-patient is followed by neurology and interventional radiology.  Olga Millers, MD

## 2020-05-24 ENCOUNTER — Other Ambulatory Visit: Payer: Self-pay | Admitting: Cardiology

## 2020-05-24 NOTE — Telephone Encounter (Signed)
    *  STAT* If patient is at the pharmacy, call can be transferred to refill team.   1. Which medications need to be refilled? (please list name of each medication and dose if known) apixaban (ELIQUIS) 5 MG TABS tablet  2. Which pharmacy/location (including street and city if local pharmacy) is medication to be sent to? [facility] - [place], [place] - [address]  3. Do they need a 30 day or 90 day supply? 90 days

## 2020-05-25 MED ORDER — APIXABAN 5 MG PO TABS: 5.0000 mg | ORAL_TABLET | Freq: Two times a day (BID) | ORAL | 1 refills | Status: DC

## 2020-05-25 NOTE — Telephone Encounter (Signed)
Prescription refill request for Eliquis received. Indication: Atrial Fibrillation Last office visit: 02/29/2020 C[place]shaw Scr: 0.93 02/22/2020 Age: 74 Weight: 84.7 kg  Prescription refilled

## 2020-05-30 ENCOUNTER — Other Ambulatory Visit: Payer: Self-pay

## 2020-05-30 ENCOUNTER — Encounter: Payer: Self-pay | Admitting: Cardiology

## 2020-05-30 ENCOUNTER — Ambulatory Visit (INDEPENDENT_AMBULATORY_CARE_PROVIDER_SITE_OTHER): Payer: Medicare Other | Admitting: Cardiology

## 2020-05-30 VITALS — BP 108/66 | HR 68 | Ht 66.5 in | Wt 186.4 lb

## 2020-05-30 DIAGNOSIS — E7800 Pure hypercholesterolemia, unspecified: Secondary | ICD-10-CM

## 2020-05-30 DIAGNOSIS — I10 Essential (primary) hypertension: Secondary | ICD-10-CM

## 2020-05-30 DIAGNOSIS — I4819 Other persistent atrial fibrillation: Secondary | ICD-10-CM

## 2020-05-30 NOTE — Patient Instructions (Signed)
Medication Instructions:  NO CHANGE *If you need a refill on your cardiac medications before your next appointment, please call your pharmacy*   Lab Work: If you have labs (blood work) drawn today and your tests are completely normal, you will receive your results only by: . MyChart Message (if you have MyChart) OR . A paper copy in the mail If you have any lab test that is abnormal or we need to change your treatment, we will call you to review the results.   Follow-Up: At [facility], you and your health needs are our priority.  As part of our continuing mission to provide you with exceptional heart care, we have created designated Provider Care Teams.  These Care Teams include your primary Cardiologist (physician) and Advanced Practice Providers (APPs -  Physician Assistants and Nurse Practitioners) who all work together to provide you with the care you need, when you need it.  We recommend signing up for the patient portal called "MyChart".  Sign up information is provided on this After Visit Summary.  MyChart is used to connect with patients for Virtual Visits (Telemedicine).  Patients are able to view lab/test results, encounter notes, upcoming appointments, etc.  Non-urgent messages can be sent to your provider as well.   To learn more about what you can do with MyChart, go to [email or web address].    Your next appointment:   6 week(s)  The format for your next appointment:   In Person  Provider:   Brian Crenshaw, MD    

## 2020-07-02 NOTE — Progress Notes (Signed)
HPI: FUatrial fibrillation andhypertension. MRA February 2019 showed no significant stenosis. 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 Also h/o TIA.Renal Dopplers December 2020 showed no renal artery stenosis. Echocardiogram April 2021 showed normal LV function, mild left ventricular hypertrophy, biatrial enlargement, mild mitral regurgitation and mild tricuspid regurgitation. 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 TEE May 2021 showed normal LV function, mild mitral regurgitation, moderate left atrial enlargement, no left atrial appendage thrombus and mild tricuspid regurgitation.  Patient subsequently underwent cardioversion successfully to sinus bradycardia.  Since last seen,she has minimal dyspnea on exertion.  She does have some fatigue.  No palpitations or syncope.  No chest pain.  Current Outpatient Medications  Medication Sig Dispense Refill  . acetaminophen (TYLENOL) 500 MG tablet Take 500 mg by mouth every 6 (six) hours as needed (for pain.).    Marland Kitchen amLODipine (NORVASC) 2.5 MG tablet Take 2.5 mg by mouth at bedtime.    Marland Kitchen apixaban (ELIQUIS) 5 MG TABS tablet Take 1 tablet (5 mg total) by mouth 2 (two) times daily. 180 tablet 1  . atorvastatin (LIPITOR) 10 MG tablet Take 10 mg by mouth at bedtime.     . Cholecalciferol (VITAMIN D-3) 25 MCG (1000 UT) CAPS Take 1,000 Units by mouth daily.    . cyanocobalamin (,VITAMIN B-12,) 1000 MCG/ML injection Inject 1,000 mcg into the muscle every 30 (thirty) days.     . hydrochlorothiazide (HYDRODIURIL) 12.5 MG tablet Take 1 tablet (12.5 mg total) by mouth daily. 90 tablet 1  . losartan (COZAAR) 50 MG tablet Take 1 tablet (50 mg total) by mouth 2 (two) times daily. 180 tablet 3  . nebivolol (BYSTOLIC) 5 MG tablet Take 1 tablet (5 mg total) by mouth 2 (two) times daily. 60 tablet 0  . omeprazole (PRILOSEC)  40 MG capsule Take 1 capsule (40 mg total) by mouth daily. 90 capsule 3  . Probiotic Product (DIGESTIVE ADVANTAGE PO) Take 1 capsule by mouth daily.    . Wheat Dextrin (BENEFIBER DRINK MIX PO) Take 4.2 g by mouth daily. Mix 1 teaspoonful of powder into 6-8 ounces of coffee and drink in the morning (once a day)     No current facility-administered medications for this visit.     Past Medical History:  Diagnosis Date  . GERD (gastroesophageal reflux disease)   . Hypercholesteremia   . Hyperlipidemia 12/05/2017  . Hypertension   . Hypothyroid   . Lyme disease   . Osteopenia after menopause 05/14/2018   DEXA 04/2018, [facility]: Impression- Osteopenia   T Scores  R Femur Neck- -0.70 L Femur Neck- -1.20 R Total Femur- -0.40 L Total Femur- -1.50 AP Total Spine: -0.30   . SVT (supraventricular tachycardia) (HCC)   . TIA (transient ischemic attack) 12/05/2017  . TIA (transient ischemic attack)     Past Surgical History:  Procedure Laterality Date  . ABDOMINAL HYSTERECTOMY    . APPENDECTOMY    . BUNIONECTOMY Left   . CARDIOVERSION N/A 03/08/2020   Procedure: CARDIOVERSION;  Surgeon: Lewayne Bunting, MD;  Location: [facility] ENDOSCOPY;  Service: Cardiovascular;  Laterality: N/A;  . IR RADIOLOGIST EVAL & MGMT  01/05/2018  . TEE WITHOUT CARDIOVERSION N/A 03/08/2020   Procedure: TRANSESOPHAGEAL ECHOCARDIOGRAM (TEE);  Surgeon: Lewayne Bunting, MD;  Location: [facility] ENDOSCOPY;  Service: Cardiovascular;  Laterality: N/A;    Social History   Socioeconomic History  .  Marital status: Married    Spouse name: Not on file  . Number of children: 0  . Years of education: 18-20  . Highest education level: Not on file  Occupational History  . Occupation: Retired  Tobacco Use  . Smoking status: Never Smoker  . Smokeless tobacco: Never Used  Vaping Use  . Vaping Use: Never used  Substance and Sexual Activity  . Alcohol use: No  . Drug use: No  . Sexual activity: Yes  Other Topics Concern  . Not on file    Social History Narrative   Lives with husband   Caffeine use: Coffee daily   Right handed   Social Determinants of Health   Financial Resource Strain:   . Difficulty of Paying Living Expenses: Not on file  Food Insecurity:   . Worried About Programme researcher, broadcasting/film/video in the Last Year: Not on file  . Ran Out of Food in the Last Year: Not on file  Transportation Needs:   . Lack of Transportation (Medical): Not on file  . Lack of Transportation (Non-Medical): Not on file  Physical Activity:   . Days of Exercise per Week: Not on file  . Minutes of Exercise per Session: Not on file  Stress:   . Feeling of Stress : Not on file  Social Connections:   . Frequency of Communication with Friends and Family: Not on file  . Frequency of Social Gatherings with Friends and Family: Not on file  . Attends Religious Services: Not on file  . Active Member of Clubs or Organizations: Not on file  . Attends Banker Meetings: Not on file  . Marital Status: Not on file  Intimate Partner Violence:   . Fear of Current or Ex-Partner: Not on file  . Emotionally Abused: Not on file  . Physically Abused: Not on file  . Sexually Abused: Not on file    Family History  Problem Relation Age of Onset  . Stroke Mother   . High blood pressure Mother   . High Cholesterol Mother   . Stroke Father   . High blood pressure Father   . High Cholesterol Father   . Cancer Father   . Heart attack Maternal Grandfather   . Heart disease Maternal Grandfather   . Stroke Maternal Grandfather   . Early death Paternal Grandmother   . Heart attack Paternal Grandmother   . Heart disease Paternal Grandmother   . Heart attack Paternal Grandfather   . Heart disease Paternal Grandfather     ROS: no fevers or chills, productive cough, hemoptysis, dysphasia, odynophagia, melena, hematochezia, dysuria, hematuria, rash, seizure activity, orthopnea, PND, pedal edema, claudication. Remaining systems are  negative.  Physical Exam: Well-developed well-nourished in no acute distress.  Skin is warm and dry.  HEENT is normal.  Neck is supple.  Chest is clear to auscultation with normal expansion.  Cardiovascular exam is irregular Abdominal exam nontender or distended. No masses palpated. Extremities show no edema. neuro grossly intact  ECG-atrial fibrillation at a rate of 52, no ST changes.  Personally reviewed  A/P  1 persistent atrial fibrillation-patient remains in atrial fibrillation today.  She did not feel as though her symptoms were dramatically different following previous cardioversion.  We therefore have elected to continue with rate control and anticoagulation.  Continue Bystolic and apixaban.  Note she will require lifelong anticoagulation given previous embolic event.  Check potassium and renal function.  2 hypertension-patient's blood pressure is controlled today.  Continue present  medical regimen.  3 hyperlipidemia-continue statin.  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  5 cerebral aneurysm-followed by neurology and interventional radiology.  Olga Millers, MD

## 2020-07-11 ENCOUNTER — Ambulatory Visit (INDEPENDENT_AMBULATORY_CARE_PROVIDER_SITE_OTHER): Payer: Medicare Other | Admitting: Cardiology

## 2020-07-11 ENCOUNTER — Encounter: Payer: Self-pay | Admitting: Cardiology

## 2020-07-11 ENCOUNTER — Other Ambulatory Visit: Payer: Self-pay

## 2020-07-11 VITALS — BP 130/74 | HR 52 | Ht 66.5 in | Wt 188.8 lb

## 2020-07-11 DIAGNOSIS — I4819 Other persistent atrial fibrillation: Secondary | ICD-10-CM | POA: Diagnosis not present

## 2020-07-11 DIAGNOSIS — E7800 Pure hypercholesterolemia, unspecified: Secondary | ICD-10-CM | POA: Diagnosis not present

## 2020-07-11 DIAGNOSIS — I10 Essential (primary) hypertension: Secondary | ICD-10-CM | POA: Diagnosis not present

## 2020-07-11 NOTE — Patient Instructions (Signed)

## 2020-07-12 LAB — CBC
Hematocrit: 39.8 % (ref 34.0–46.6)
Hemoglobin: 13.1 g/dL (ref 11.1–15.9)
MCH: 28.2 pg (ref 26.6–33.0)
MCHC: 32.9 g/dL (ref 31.5–35.7)
MCV: 86 fL (ref 79–97)
Platelets: 304 10*3/uL (ref 150–450)
RBC: 4.65 x10E6/uL (ref 3.77–5.28)
RDW: 12.9 % (ref 11.7–15.4)
WBC: 7.8 10*3/uL (ref 3.4–10.8)

## 2020-07-12 LAB — BASIC METABOLIC PANEL
BUN/Creatinine Ratio: 25 (ref 12–28)
BUN: 21 mg/dL (ref 8–27)
CO2: 26 mmol/L (ref 20–29)
Calcium: 9.6 mg/dL (ref 8.7–10.3)
Chloride: 99 mmol/L (ref 96–106)
Creatinine, Ser: 0.83 mg/dL (ref 0.57–1.00)
GFR calc Af Amer: 80 mL/min/{1.73_m2} (ref >59)
GFR calc non Af Amer: 70 mL/min/{1.73_m2} (ref >59)
Glucose: 91 mg/dL (ref 65–99)
Potassium: 4.4 mmol/L (ref 3.5–5.2)
Sodium: 138 mmol/L (ref 134–144)

## 2020-08-21 DIAGNOSIS — I4819 Other persistent atrial fibrillation: Secondary | ICD-10-CM

## 2020-08-21 MED ORDER — APIXABAN 5 MG PO TABS: 5.0000 mg | ORAL_TABLET | Freq: Two times a day (BID) | ORAL | 3 refills | Status: DC

## 2020-10-10 ENCOUNTER — Telehealth (HOSPITAL_COMMUNITY): Payer: Self-pay

## 2020-10-10 ENCOUNTER — Other Ambulatory Visit (HOSPITAL_COMMUNITY): Payer: Self-pay | Admitting: Interventional Radiology

## 2020-10-10 DIAGNOSIS — I671 Cerebral aneurysm, nonruptured: Secondary | ICD-10-CM

## 2020-10-10 NOTE — Telephone Encounter (Signed)
Called to schedule mri, no answer, left vm. AW 

## 2020-11-12 ENCOUNTER — Ambulatory Visit (HOSPITAL_COMMUNITY)
Admission: RE | Admit: 2020-11-12 | Discharge: 2020-11-12 | Disposition: A | Discharge status: Home / Self Care | Payer: Medicare Other | Source: Ambulatory Visit | Attending: Interventional Radiology | Admitting: Interventional Radiology

## 2020-11-12 ENCOUNTER — Encounter (HOSPITAL_COMMUNITY): Payer: Self-pay

## 2020-11-12 ENCOUNTER — Other Ambulatory Visit: Payer: Self-pay

## 2020-11-12 DIAGNOSIS — I671 Cerebral aneurysm, nonruptured: Secondary | ICD-10-CM | POA: Insufficient documentation

## 2020-11-13 ENCOUNTER — Telehealth (HOSPITAL_COMMUNITY): Payer: Self-pay

## 2020-11-13 NOTE — Telephone Encounter (Signed)
Pt agreed to f/u in 2 years with an mra. AW  

## 2020-11-13 NOTE — Telephone Encounter (Signed)
Called pt regarding recent imaging, no answer, left vm. AW  

## 2020-12-05 ENCOUNTER — Ambulatory Visit: Payer: Medicare Other | Admitting: Family Medicine

## 2020-12-20 ENCOUNTER — Other Ambulatory Visit: Payer: Self-pay

## 2020-12-20 ENCOUNTER — Encounter: Payer: Self-pay | Admitting: Family Medicine

## 2020-12-20 ENCOUNTER — Ambulatory Visit (INDEPENDENT_AMBULATORY_CARE_PROVIDER_SITE_OTHER): Payer: Medicare Other | Admitting: Family Medicine

## 2020-12-20 DIAGNOSIS — I10 Essential (primary) hypertension: Secondary | ICD-10-CM

## 2020-12-20 DIAGNOSIS — E7800 Pure hypercholesterolemia, unspecified: Secondary | ICD-10-CM

## 2020-12-20 DIAGNOSIS — I671 Cerebral aneurysm, nonruptured: Secondary | ICD-10-CM | POA: Diagnosis not present

## 2020-12-20 DIAGNOSIS — D735 Infarction of spleen: Secondary | ICD-10-CM

## 2020-12-20 DIAGNOSIS — K219 Gastro-esophageal reflux disease without esophagitis: Secondary | ICD-10-CM | POA: Diagnosis not present

## 2020-12-20 DIAGNOSIS — I4819 Other persistent atrial fibrillation: Secondary | ICD-10-CM

## 2020-12-20 NOTE — Assessment & Plan Note (Signed)
Continue omeprazole 

## 2020-12-20 NOTE — Assessment & Plan Note (Signed)
This has been stable.  Denies new symptoms.

## 2020-12-20 NOTE — Assessment & Plan Note (Signed)
Denies abdominal pain at this time.

## 2020-12-20 NOTE — Assessment & Plan Note (Signed)
Rate controlled with bystolic Doing well with eliquis for anticoagulation.  She will continue to see Dr. Jens Som as well.

## 2020-12-20 NOTE — Assessment & Plan Note (Signed)
Continue atorvastatin at current strength.  

## 2020-12-20 NOTE — Patient Instructions (Signed)
Great to meet you today! Let me know when you are needing refills.  I would like to follow up with you in 6 months or sooner if needed.  

## 2020-12-20 NOTE — Assessment & Plan Note (Signed)
Blood pressure is at goal at for age and co-morbidities.  I recommend continuation of current medications.  In addition they were instructed to follow a low sodium diet with regular exercise to help to maintain adequate control of blood pressure.  ? ?

## 2020-12-20 NOTE — Progress Notes (Signed)
Adya Wirz - 75 y.o. female MRN [id number]  Date of birth: 13-Nov-1945  Subjective Chief Complaint  Patient presents with   Establish Care    HPI Leonela Kivi is a 75 y.o. female here today for initial visit to establish care.  She has a history of HTN, PAF, GERD, cerebral aneurysm, HLD and splenic infarction.   She is followed by Dr. Jens Som for her A. Fib and HTN.    HTN currently managed with amlodipine, losartan, HCTZ and nebivolol.  BP has remained well controlled with current medications.  She has not had side effects from medication.  HR remains well controlled with nebivolol.   She denies chest pain, shortness of breath, palpitations, headache or vision changes.  She is anticoagulated with eliquis, history of possible TIA in the past.   She is doing well with atorvastatin without myalgias.   She reports history of splenic infarction last year.  This occurred shortly after receiving her second COVID vaccine.  This is also when she was found to be in a. Fib.  She is concerned that this may have been brought on by vaccine so she did not get booster.  She has not had any further abdominal pain.   GERD well managed with omeprazole.    ROS:  A comprehensive ROS was completed and negative except as noted per HPI   Allergies  Allergen Reactions   Adhesive [Tape] Other (See Comments)    Heart monitor leads- CREATED RED WELTS!!   Other Other (See Comments)    Has had adverse reaction to a migraine medication Heart monitor leads- CREATED RED WELTS!!   Penicillins Itching    Did it involve swelling of the face/tongue/throat, SOB, or low BP? Unk Did it involve sudden or severe rash/hives, skin peeling, or any reaction on the inside of your mouth or nose? Unk Did you need to seek medical attention at a hospital or doctor's office? No When did it last happen? Childhood If all above answers are "NO", may proceed with cephalosporin use.    Simvastatin Itching   Sumatriptan  Succinate Other (See Comments)    Hypotension     Past Medical History:  Diagnosis Date   GERD (gastroesophageal reflux disease)    Hypercholesteremia    Hyperlipidemia 12/05/2017   Hypertension    Hypothyroid    Lyme disease    Osteopenia after menopause 05/14/2018   DEXA 04/2018, [facility]: Impression- Osteopenia   T Scores  R Femur Neck- -0.70 L Femur Neck- -1.20 R Total Femur- -0.40 L Total Femur- -1.50 AP Total Spine: -0.30    SVT (supraventricular tachycardia) (HCC)    TIA (transient ischemic attack) 12/05/2017   TIA (transient ischemic attack)     Past Surgical History:  Procedure Laterality Date   ABDOMINAL HYSTERECTOMY     APPENDECTOMY     BUNIONECTOMY Left    CARDIOVERSION N/A 03/08/2020   Procedure: CARDIOVERSION;  Surgeon: Lewayne Bunting, MD;  Location: [facility] ENDOSCOPY;  Service: Cardiovascular;  Laterality: N/A;   IR RADIOLOGIST EVAL & MGMT  01/05/2018   TEE WITHOUT CARDIOVERSION N/A 03/08/2020   Procedure: TRANSESOPHAGEAL ECHOCARDIOGRAM (TEE);  Surgeon: Lewayne Bunting, MD;  Location: [facility] ENDOSCOPY;  Service: Cardiovascular;  Laterality: N/A;    Social History   Socioeconomic History   Marital status: Married    Spouse name: Not on file   Number of children: 0   Years of education: 18-20   Highest education level: Not on file  Occupational History   Occupation:  Retired  Tobacco Use   Smoking status: Never Smoker   Smokeless tobacco: Never Used  Building services engineer Use: Never used  Substance and Sexual Activity   Alcohol use: No   Drug use: No   Sexual activity: Yes  Other Topics Concern   Not on file  Social History Narrative   Lives with husband   Caffeine use: Coffee daily   Right handed   Social Determinants of Health   Financial Resource Strain: Not on file  Food Insecurity: Not on file  Transportation Needs: Not on file  Physical Activity: Not on file  Stress: Not on file  Social Connections: Not on file    Family  History  Problem Relation Age of Onset   Stroke Mother    High blood pressure Mother    High Cholesterol Mother    Hypertension Mother    Stroke Father    High blood pressure Father    High Cholesterol Father    Cancer Father    Hypertension Father    Lung cancer Father    Heart attack Maternal Grandfather    Heart disease Maternal Grandfather    Stroke Maternal Grandfather    Early death Paternal Grandmother    Heart attack Paternal Grandmother    Heart disease Paternal Grandmother    Heart attack Paternal Grandfather    Heart disease Paternal Grandfather     Health Maintenance  Topic Date Due   Hepatitis C Screening  Never done   MAMMOGRAM  05/13/2019   COVID-19 Vaccine (3 - Booster for Moderna series) 05/21/2020   INFLUENZA VACCINE  01/10/2021 (Originally 05/13/2020)   COLONOSCOPY (Pts 45-20yrs Insurance coverage will need to be confirmed)  02/11/2024   DEXA SCAN  Completed   PNA vac Low Risk Adult  Completed   HPV VACCINES  Aged Out   TETANUS/TDAP  Discontinued     ----------------------------------------------------------------------------------------------------------------------------------------------------------------------------------------------------------------- Physical Exam BP 128/75 (BP Location: Left Arm, Patient Position: Sitting, Cuff Size: Normal)    Pulse 62    Temp (!) 97.4 F (36.3 C)    Ht 5' 4.76" (1.645 m)    Wt 191 lb 11.2 oz (87 kg)    SpO2 100%    BMI 32.13 kg/m   Physical Exam Constitutional:      Appearance: Normal appearance.  HENT:     Head: Normocephalic.  Musculoskeletal:     Cervical back: Neck supple.  Neurological:     General: No focal deficit present.     Mental Status: She is alert.  Psychiatric:        Mood and Affect: Mood normal.        Behavior: Behavior normal.      ------------------------------------------------------------------------------------------------------------------------------------------------------------------------------------------------------------------- Assessment and Plan  Atrial fibrillation (HCC) Rate controlled with bystolic Doing well with eliquis for anticoagulation.  She will continue to see Dr. Jens Som as well.   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  Denies new symptoms.    Essential hypertension Blood pressure is at goal at for age and co-morbidities.  I recommend continuation of current medications.  In addition they were instructed to follow a low sodium diet with regular exercise to help to maintain adequate control of blood pressure.    GERD (gastroesophageal reflux disease) Continue omeprazole.   Splenic infarction, by imaging Denies abdominal pain at this time.    Hyperlipidemia Continue atorvastatin at current strength.    No orders of the defined types were placed in this encounter.   Return in  about 6 months (around 06/22/2021) for HTN.    This visit occurred during the SARS-CoV-2 public health emergency.  Safety protocols were in place, including screening questions prior to the visit, additional usage of staff PPE, and extensive cleaning of exam room while observing appropriate contact time as indicated for disinfecting solutions.

## 2020-12-28 ENCOUNTER — Encounter: Payer: Self-pay | Admitting: Family Medicine

## 2020-12-28 ENCOUNTER — Other Ambulatory Visit: Payer: Self-pay | Admitting: Family Medicine

## 2020-12-28 MED ORDER — PLENITY PO CAPS: 2.2500 g | ORAL_CAPSULE | Freq: Two times a day (BID) | ORAL | 2 refills | Status: DC

## 2020-12-31 NOTE — Progress Notes (Signed)
HPI: FUatrial fibrillation andhypertension. MRA February 2019 showed no significant stenosis. 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 Also h/o TIA.Renal Dopplers December 2020 showed no renal artery stenosis. Echocardiogram April 2021 showed normal LV function, mild left ventricular hypertrophy, biatrial enlargement, mild mitralregurgitationand mild tricuspid regurgitation. Presented April 2021 with atrial fibrillation and had a splenic infarct on CT.TEE May 2021 showed normal LV function, mild mitral regurgitation, moderate left atrial enlargement, no left atrial appendage thrombus and mild tricuspid regurgitation. Patient subsequently underwent cardioversion successfully to sinus bradycardia. Since last seen,she denies increased dyspnea, chest pain or syncope.  She has noticed elevated heart rate and palpitations at times.  She continues to have fatigue.  Current Outpatient Medications  Medication Sig Dispense Refill  . acetaminophen (TYLENOL) 500 MG tablet Take 500 mg by mouth every 6 (six) hours as needed (for pain.).    Marland Kitchen amLODipine (NORVASC) 2.5 MG tablet Take 2.5 mg by mouth at bedtime.    Marland Kitchen apixaban (ELIQUIS) 5 MG TABS tablet Take 1 tablet (5 mg total) by mouth 2 (two) times daily. 180 tablet 3  . atorvastatin (LIPITOR) 10 MG tablet Take 10 mg by mouth at bedtime.     . Cholecalciferol (VITAMIN D-3) 25 MCG (1000 UT) CAPS Take 1,000 Units by mouth daily.    . hydrochlorothiazide (HYDRODIURIL) 12.5 MG tablet Take 1 tablet (12.5 mg total) by mouth daily. 90 tablet 1  . losartan (COZAAR) 50 MG tablet Take 1 tablet (50 mg total) by mouth 2 (two) times daily. 180 tablet 3  . nebivolol (BYSTOLIC) 5 MG tablet Take 1 tablet (5 mg total) by mouth 2 (two) times daily. 60 tablet 0  . omeprazole (PRILOSEC) 40 MG capsule Take 1 capsule (40 mg total) by mouth daily. 90 capsule 3  .  Probiotic Product (DIGESTIVE ADVANTAGE PO) Take 1 capsule by mouth daily.    . vitamin C (ASCORBIC ACID) 500 MG tablet Take 500 mg by mouth daily.    . Wheat Dextrin (BENEFIBER DRINK MIX PO) Take 4.2 g by mouth daily. Mix 1 teaspoonful of powder into 6-8 ounces of coffee and drink in the morning (once a day)    . Carboxymeth-Cellulose-CitricAc (PLENITY) CAPS Take 2.25 g by mouth 2 (two) times daily. Take with water 30 minutes before lunch and dinner. 180 capsule 2   No current facility-administered medications for this visit.     Past Medical History:  Diagnosis Date  . GERD (gastroesophageal reflux disease)   . Hypercholesteremia   . Hyperlipidemia 12/05/2017  . Hypertension   . Hypothyroid   . Lyme disease   . Osteopenia after menopause 05/14/2018   DEXA 04/2018, [facility]: Impression- Osteopenia   T Scores  R Femur Neck- -0.70 L Femur Neck- -1.20 R Total Femur- -0.40 L Total Femur- -1.50 AP Total Spine: -0.30   . SVT (supraventricular tachycardia) (HCC)   . TIA (transient ischemic attack) 12/05/2017  . TIA (transient ischemic attack)     Past Surgical History:  Procedure Laterality Date  . ABDOMINAL HYSTERECTOMY    . APPENDECTOMY    . BUNIONECTOMY Left   . CARDIOVERSION N/A 03/08/2020   Procedure: CARDIOVERSION;  Surgeon: Lewayne Bunting, MD;  Location: [facility] ENDOSCOPY;  Service: Cardiovascular;  Laterality: N/A;  . IR RADIOLOGIST EVAL & MGMT  01/05/2018  . TEE WITHOUT CARDIOVERSION N/A 03/08/2020   Procedure: TRANSESOPHAGEAL ECHOCARDIOGRAM (TEE);  Surgeon: Lewayne Bunting,  MD;  Location: [facility] ENDOSCOPY;  Service: Cardiovascular;  Laterality: N/A;    Social History   Socioeconomic History  . Marital status: Married    Spouse name: Not on file  . Number of children: 0  . Years of education: 18-20  . Highest education level: Not on file  Occupational History  . Occupation: Retired  Tobacco Use  . Smoking status: Never Smoker  . Smokeless tobacco: Never Used  Vaping Use  . Vaping  Use: Never used  Substance and Sexual Activity  . Alcohol use: No  . Drug use: No  . Sexual activity: Yes  Other Topics Concern  . Not on file  Social History Narrative   Lives with husband   Caffeine use: Coffee daily   Right handed   Social Determinants of Health   Financial Resource Strain: Not on file  Food Insecurity: Not on file  Transportation Needs: Not on file  Physical Activity: Not on file  Stress: Not on file  Social Connections: Not on file  Intimate Partner Violence: Not on file    Family History  Problem Relation Age of Onset  . Stroke Mother   . High blood pressure Mother   . High Cholesterol Mother   . Hypertension Mother   . Stroke Father   . High blood pressure Father   . High Cholesterol Father   . Cancer Father   . Hypertension Father   . Lung cancer Father   . Heart attack Maternal Grandfather   . Heart disease Maternal Grandfather   . Stroke Maternal Grandfather   . Early death Paternal Grandmother   . Heart attack Paternal Grandmother   . Heart disease Paternal Grandmother   . Heart attack Paternal Grandfather   . Heart disease Paternal Grandfather     ROS: Fatigue but no fevers or chills, productive cough, hemoptysis, dysphasia, odynophagia, melena, hematochezia, dysuria, hematuria, rash, seizure activity, orthopnea, PND, pedal edema, claudication. Remaining systems are negative.  Physical Exam: Well-developed well-nourished in no acute distress.  Skin is warm and dry.  HEENT is normal.  Neck is supple.  Chest is clear to auscultation with normal expansion.  Cardiovascular exam is irregular Abdominal exam nontender or distended. No masses palpated. Extremities show no edema. neuro grossly intact  A/P  1 permanent atrial fibrillation-plan is rate control and anticoagulation.  She is describing palpitations at times and elevated heart rate.  I will arrange a 3-day Zio patch to make sure that her rate is adequately controlled.  If not  we will advance AV nodal blocking agents.  She is otherwise asymptomatic and I will plan rate control and anticoagulation long-term.  Continue Bystolic and apixaban.  As outlined above she will require lifelong anticoagulation given previous embolic event.  2 prior splenic infarct/TIA-continue anticoagulation.  3 hypertension-blood pressure controlled.  Continue present medications.  4 hyperlipidemia-continue statin.  5 cerebral aneurysm-Per neurology and interventional radiology.  Olga Millers, MD

## 2021-01-02 ENCOUNTER — Ambulatory Visit (INDEPENDENT_AMBULATORY_CARE_PROVIDER_SITE_OTHER): Payer: Medicare Other

## 2021-01-02 ENCOUNTER — Other Ambulatory Visit: Payer: Self-pay

## 2021-01-02 ENCOUNTER — Encounter: Payer: Self-pay | Admitting: Cardiology

## 2021-01-02 ENCOUNTER — Ambulatory Visit (INDEPENDENT_AMBULATORY_CARE_PROVIDER_SITE_OTHER): Payer: Medicare Other | Admitting: Cardiology

## 2021-01-02 VITALS — BP 112/74 | HR 60 | Ht 67.0 in | Wt 189.0 lb

## 2021-01-02 DIAGNOSIS — I4819 Other persistent atrial fibrillation: Secondary | ICD-10-CM

## 2021-01-02 DIAGNOSIS — E7800 Pure hypercholesterolemia, unspecified: Secondary | ICD-10-CM | POA: Diagnosis not present

## 2021-01-02 DIAGNOSIS — I10 Essential (primary) hypertension: Secondary | ICD-10-CM | POA: Diagnosis not present

## 2021-01-02 NOTE — Patient Instructions (Signed)
Testing/Procedures:  Allison Richardson- Long Term Monitor Instructions   Your physician has requested you wear your ZIO patch monitor__3_____days.   This is a single patch monitor.  [employer] supplies one patch monitor per enrollment.  Additional stickers are not available.   Please do not apply patch if you will be having a Nuclear Stress Test, Echocardiogram, Cardiac CT, MRI, or Chest Xray during the time frame you would be wearing the monitor. The patch cannot be worn during these tests.  You cannot remove and re-apply the ZIO XT patch monitor.   Your ZIO patch monitor will be sent USPS Priority mail from [facility] directly to your home address. The monitor may also be mailed to a PO BOX if home delivery is not available.   It may take 3-5 days to receive your monitor after you have been enrolled.   Once you have received you monitor, please review enclosed instructions.  Your monitor has already been registered assigning a specific monitor serial # to you.   Applying the monitor   Shave hair from upper left chest.   Hold abrader disc by orange tab.  Rub abrader in 40 strokes over left upper chest as indicated in your monitor instructions.   Clean area with 4 enclosed alcohol pads .  Use all pads to assure are is cleaned thoroughly.  Let dry.   Apply patch as indicated in monitor instructions.  Patch will be place under collarbone on left side of chest with arrow pointing upward.   Rub patch adhesive wings for 2 minutes.Remove white label marked "1".  Remove white label marked "2".  Rub patch adhesive wings for 2 additional minutes.   While looking in a mirror, press and release button in center of patch.  A small green light will flash 3-4 times .  This will be your only indicator the monitor has been turned on.     Do not shower for the first 24 hours.  You may shower after the first 24 hours.   Press button if you feel a symptom. You will hear a small click.  Record Date, Time and  Symptom in the Patient Log Book.   When you are ready to remove patch, follow instructions on last 2 pages of Patient Log Book.  Stick patch monitor onto last page of Patient Log Book.   Place Patient Log Book in [place] box.  Use locking tab on box and tape box closed securely.  The [employer] and [employer] has [employer] on it.  Please place in mailbox as soon as possible.  Your physician should have your test results approximately 7 days after the monitor has been mailed back to [facility].   Call [facility] Customer Care at [phone] if you have questions regarding your ZIO XT patch monitor.  Call them immediately if you see an orange light blinking on your monitor.   If your monitor falls off in less than 4 days contact our Monitor department at [phone].  If your monitor becomes loose or falls off after 4 days call [employer] at [phone] for suggestions on securing your monitor.      Follow-Up: At [facility], you and your health needs are our priority.  As part of our continuing mission to provide you with exceptional heart care, we have created designated Provider Care Teams.  These Care Teams include your primary Cardiologist (physician) and Advanced Practice Providers (APPs -  Physician Assistants and Nurse Practitioners) who all work together to provide  you with the care you need, when you need it.  We recommend signing up for the patient portal called "MyChart".  Sign up information is provided on this After Visit Summary.  MyChart is used to connect with patients for Virtual Visits (Telemedicine).  Patients are able to view lab/test results, encounter notes, upcoming appointments, etc.  Non-urgent messages can be sent to your provider as well.   To learn more about what you can do with MyChart, go to [email or web address].    Your next appointment:   3-4 month(s)  The format for your next appointment:   In Person  Provider:   Olga Millers, MD

## 2021-01-04 DIAGNOSIS — I4819 Other persistent atrial fibrillation: Secondary | ICD-10-CM | POA: Diagnosis not present

## 2021-01-29 ENCOUNTER — Telehealth: Payer: Self-pay | Admitting: *Deleted

## 2021-01-29 NOTE — Telephone Encounter (Signed)
PA approved for eliquis via cover my meds.

## 2021-03-02 ENCOUNTER — Encounter: Payer: Self-pay | Admitting: Family Medicine

## 2021-03-04 ENCOUNTER — Encounter: Payer: Self-pay | Admitting: Family Medicine

## 2021-03-04 DIAGNOSIS — I10 Essential (primary) hypertension: Secondary | ICD-10-CM

## 2021-03-04 MED ORDER — LOSARTAN POTASSIUM 50 MG PO TABS: 50.0000 mg | ORAL_TABLET | Freq: Two times a day (BID) | ORAL | 3 refills | Status: DC

## 2021-03-18 MED ORDER — AMLODIPINE BESYLATE 2.5 MG PO TABS: 2.5000 mg | ORAL_TABLET | Freq: Every day | ORAL | 3 refills | Status: DC

## 2021-03-18 MED ORDER — NEBIVOLOL HCL 5 MG PO TABS: 5.0000 mg | ORAL_TABLET | Freq: Two times a day (BID) | ORAL | 3 refills | Status: DC

## 2021-04-18 NOTE — Progress Notes (Signed)
HPI: FU atrial fibrillation and hypertension. MRA February 2019 showed no significant stenosis.  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  Seen by interventional radiology and options included arteriogram versus watchful waiting. Also h/o TIA.  Renal Dopplers December 2020 showed no renal artery stenosis.  Echocardiogram April 2021 showed normal LV function, mild left ventricular hypertrophy, biatrial enlargement, mild mitral regurgitation and mild tricuspid regurgitation.  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 TEE May 2021 showed normal LV function, mild mitral regurgitation, moderate left atrial enlargement, no left atrial appendage thrombus and mild tricuspid regurgitation.  Patient subsequently underwent cardioversion successfully to sinus bradycardia.  Monitor April 2022 showed atrial fibrillation rate controlled.  Since last seen, she has some DOE and fatigue; no CP, palpitations or bleeding.  Current Outpatient Medications  Medication Sig Dispense Refill   acetaminophen (TYLENOL) 500 MG tablet Take 500 mg by mouth every 6 (six) hours as needed (for pain.).     amLODipine (NORVASC) 2.5 MG tablet Take 1 tablet (2.5 mg total) by mouth at bedtime. 90 tablet 3   apixaban (ELIQUIS) 5 MG TABS tablet Take 1 tablet (5 mg total) by mouth 2 (two) times daily. 180 tablet 3   atorvastatin (LIPITOR) 10 MG tablet Take 10 mg by mouth at bedtime.      Cholecalciferol (VITAMIN D-3) 25 MCG (1000 UT) CAPS Take 1,000 Units by mouth daily.     hydrochlorothiazide (HYDRODIURIL) 12.5 MG tablet Take 1 tablet (12.5 mg total) by mouth daily. 90 tablet 1   losartan (COZAAR) 50 MG tablet Take 1 tablet (50 mg total) by mouth 2 (two) times daily. 180 tablet 3   nebivolol (BYSTOLIC) 5 MG tablet Take 1 tablet (5 mg total) by mouth 2 (two) times daily. 180 tablet 3   omeprazole (PRILOSEC) 40 MG capsule Take 1 capsule (40 mg total) by mouth daily. 90 capsule 3   Probiotic  Product (DIGESTIVE ADVANTAGE PO) Take 1 capsule by mouth daily.     vitamin C (ASCORBIC ACID) 500 MG tablet Take 500 mg by mouth daily.     Wheat Dextrin (BENEFIBER DRINK MIX PO) Take 4.2 g by mouth daily. Mix 1 teaspoonful of powder into 6-8 ounces of coffee and drink in the morning (once a day)     Carboxymeth-Cellulose-CitricAc (PLENITY) CAPS Take 2.25 g by mouth 2 (two) times daily. Take with water 30 minutes before lunch and dinner. 180 capsule 2   No current facility-administered medications for this visit.     Past Medical History:  Diagnosis Date   GERD (gastroesophageal reflux disease)    Hypercholesteremia    Hyperlipidemia 12/05/2017   Hypertension    Hypothyroid    Lyme disease    Osteopenia after menopause 05/14/2018   DEXA 04/2018, [facility]: Impression- Osteopenia   T Scores  R Femur Neck- -0.70 L Femur Neck- -1.20 R Total Femur- -0.40 L Total Femur- -1.50 AP Total Spine: -0.30    SVT (supraventricular tachycardia) (HCC)    TIA (transient ischemic attack) 12/05/2017   TIA (transient ischemic attack)     Past Surgical History:  Procedure Laterality Date   ABDOMINAL HYSTERECTOMY     APPENDECTOMY     BUNIONECTOMY Left    CARDIOVERSION N/A 03/08/2020   Procedure: CARDIOVERSION;  Surgeon: Lewayne Bunting, MD;  Location: [facility] ENDOSCOPY;  Service: Cardiovascular;  Laterality: N/A;   IR RADIOLOGIST EVAL & MGMT  01/05/2018   TEE  WITHOUT CARDIOVERSION N/A 03/08/2020   Procedure: TRANSESOPHAGEAL ECHOCARDIOGRAM (TEE);  Surgeon: Lewayne Bunting, MD;  Location[facility] ENDOSCOPY;  Service: Cardiovascular;  Laterality: N/A;    Social History   Socioeconomic History   Marital status: Married    Spouse name: Not on file   Number of children: 0   Years of education: 18-20   Highest education level: Not on file  Occupational History   Occupation: Retired  Tobacco Use   Smoking status: Never   Smokeless tobacco: Never  Vaping Use   Vaping Use: Never used  Substance and Sexual Activity    Alcohol use: No   Drug use: No   Sexual activity: Yes  Other Topics Concern   Not on file  Social History Narrative   Lives with husband   Caffeine use: Coffee daily   Right handed   Social Determinants of Health   Financial Resource Strain: Not on file  Food Insecurity: Not on file  Transportation Needs: Not on file  Physical Activity: Not on file  Stress: Not on file  Social Connections: Not on file  Intimate Partner Violence: Not on file    Family History  Problem Relation Age of Onset   Stroke Mother    High blood pressure Mother    High Cholesterol Mother    Hypertension Mother    Stroke Father    High blood pressure Father    High Cholesterol Father    Cancer Father    Hypertension Father    Lung cancer Father    Heart attack Maternal Grandfather    Heart disease Maternal Grandfather    Stroke Maternal Grandfather    Early death Paternal Grandmother    Heart attack Paternal Grandmother    Heart disease Paternal Grandmother    Heart attack Paternal Grandfather    Heart disease Paternal Grandfather     ROS: fatigue but no fevers or chills, productive cough, hemoptysis, dysphasia, odynophagia, melena, hematochezia, dysuria, hematuria, rash, seizure activity, orthopnea, PND, pedal edema, claudication. Remaining systems are negative.  Physical Exam: Well-developed well-nourished in no acute distress.  Skin is warm and dry.  HEENT is normal.  Neck is supple.  Chest is clear to auscultation with normal expansion.  Cardiovascular exam is irregular Abdominal exam nontender or distended. No masses palpated. Extremities show no edema. neuro grossly intact  ECG-atrial fibrillation at a rate of 62, no ST changes.  Personally reviewed  A/P  1 permanent atrial fibrillation-continue Bystolic for rate control.  Continue apixaban.  Given previous embolic event she needs lifelong anticoagulation. She has some fatigue which is chronic. I discussed attempt and DCCV to  see if her symptoms improve but she declined. Check hgb and renal function. Check TSH given fatigue.  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  3 hypertension-blood pressure controlled.  Continue present medical regimen.  4 hyperlipidemia-continue statin. Check lipids and liver.   5 history of cerebral aneurysm-follow-up neurology and interventional radiology.  6 fatigue-check hgb and TSH.   Olga Millers, MD

## 2021-04-24 ENCOUNTER — Ambulatory Visit (INDEPENDENT_AMBULATORY_CARE_PROVIDER_SITE_OTHER): Payer: Medicare Other | Admitting: Cardiology

## 2021-04-24 ENCOUNTER — Other Ambulatory Visit: Payer: Self-pay

## 2021-04-24 ENCOUNTER — Encounter: Payer: Self-pay | Admitting: Cardiology

## 2021-04-24 VITALS — BP 108/78 | HR 62 | Ht 66.5 in | Wt 187.0 lb

## 2021-04-24 DIAGNOSIS — E7800 Pure hypercholesterolemia, unspecified: Secondary | ICD-10-CM | POA: Diagnosis not present

## 2021-04-24 DIAGNOSIS — I10 Essential (primary) hypertension: Secondary | ICD-10-CM | POA: Diagnosis not present

## 2021-04-24 DIAGNOSIS — I4819 Other persistent atrial fibrillation: Secondary | ICD-10-CM | POA: Diagnosis not present

## 2021-04-24 NOTE — Patient Instructions (Signed)

## 2021-04-25 LAB — LIPID PANEL
Chol/HDL Ratio: 3.7 ratio (ref 0.0–4.4)
Cholesterol, Total: 170 mg/dL (ref 100–199)
HDL: 46 mg/dL (ref >39)
LDL Chol Calc (NIH): 99 mg/dL (ref 0–99)
Triglycerides: 141 mg/dL (ref 0–149)
VLDL Cholesterol Cal: 25 mg/dL (ref 5–40)

## 2021-04-25 LAB — CBC
Hematocrit: 39.5 % (ref 34.0–46.6)
Hemoglobin: 13 g/dL (ref 11.1–15.9)
MCH: 28.7 pg (ref 26.6–33.0)
MCHC: 32.9 g/dL (ref 31.5–35.7)
MCV: 87 fL (ref 79–97)
Platelets: 295 10*3/uL (ref 150–450)
RBC: 4.53 x10E6/uL (ref 3.77–5.28)
RDW: 13 % (ref 11.7–15.4)
WBC: 6.6 10*3/uL (ref 3.4–10.8)

## 2021-04-25 LAB — COMPREHENSIVE METABOLIC PANEL
ALT: 18 IU/L (ref 0–32)
AST: 19 IU/L (ref 0–40)
Albumin/Globulin Ratio: 1.8 (ref 1.2–2.2)
Albumin: 4.2 g/dL (ref 3.7–4.7)
Alkaline Phosphatase: 105 IU/L (ref 44–121)
BUN/Creatinine Ratio: 20 (ref 12–28)
BUN: 16 mg/dL (ref 8–27)
Bilirubin Total: 0.5 mg/dL (ref 0.0–1.2)
CO2: 23 mmol/L (ref 20–29)
Calcium: 9.5 mg/dL (ref 8.7–10.3)
Chloride: 99 mmol/L (ref 96–106)
Creatinine, Ser: 0.79 mg/dL (ref 0.57–1.00)
Globulin, Total: 2.4 g/dL (ref 1.5–4.5)
Glucose: 91 mg/dL (ref 65–99)
Potassium: 4.2 mmol/L (ref 3.5–5.2)
Sodium: 137 mmol/L (ref 134–144)
Total Protein: 6.6 g/dL (ref 6.0–8.5)
eGFR: 78 mL/min/{1.73_m2} (ref >59)

## 2021-04-25 LAB — TSH: TSH: 2.79 u[IU]/mL (ref 0.450–4.500)

## 2021-05-04 ENCOUNTER — Encounter: Payer: Self-pay | Admitting: Family Medicine

## 2021-05-06 ENCOUNTER — Other Ambulatory Visit: Payer: Self-pay

## 2021-05-06 MED ORDER — ATORVASTATIN CALCIUM 10 MG PO TABS: 10.0000 mg | ORAL_TABLET | Freq: Every day | ORAL | 0 refills | Status: DC

## 2021-06-24 ENCOUNTER — Ambulatory Visit: Payer: Medicare Other | Admitting: Family Medicine

## 2021-07-02 ENCOUNTER — Ambulatory Visit: Payer: Medicare Other | Admitting: Family Medicine

## 2021-07-08 ENCOUNTER — Ambulatory Visit: Payer: Medicare Other | Admitting: Family Medicine

## 2021-07-15 ENCOUNTER — Encounter: Payer: Self-pay | Admitting: Family Medicine

## 2021-07-15 ENCOUNTER — Ambulatory Visit (INDEPENDENT_AMBULATORY_CARE_PROVIDER_SITE_OTHER): Payer: Medicare Other | Admitting: Family Medicine

## 2021-07-15 DIAGNOSIS — I4819 Other persistent atrial fibrillation: Secondary | ICD-10-CM

## 2021-07-15 DIAGNOSIS — I10 Essential (primary) hypertension: Secondary | ICD-10-CM | POA: Diagnosis not present

## 2021-07-15 DIAGNOSIS — D735 Infarction of spleen: Secondary | ICD-10-CM | POA: Diagnosis not present

## 2021-07-15 MED ORDER — ATORVASTATIN CALCIUM 10 MG PO TABS: 10.0000 mg | ORAL_TABLET | Freq: Every day | ORAL | 3 refills | Status: DC

## 2021-07-15 NOTE — Assessment & Plan Note (Signed)
She did have some left upper quadrant pain with recent episode.  This has now resolved however we discussed that if symptoms returned we may need to get additional imaging and lab work.

## 2021-07-15 NOTE — Assessment & Plan Note (Signed)
She has had some mild fatigue.  She has established with cardiology.  She will continue Bystolic for rate management.  Continue Eliquis for anticoagulation.

## 2021-07-15 NOTE — Patient Instructions (Signed)
Great to see you today! Let me know if you symptoms return.  We'll plan to follow up in 6 months or sooner if needed.

## 2021-07-15 NOTE — Assessment & Plan Note (Signed)
Her blood pressure remains well controlled.  She will continue current antihypertensive medications.

## 2021-07-15 NOTE — Progress Notes (Signed)
Allison Richardson - 75 y.o. female MRN [id number]  Date of birth: 11-29-1945  Subjective Chief Complaint  Patient presents with   Follow-up    HPI Allison Richardson is a 75 year old female here today for follow-up visit.  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  She has had occasional radiation into her left shoulder blade area.  She did not notice any blood in her stool.  She did not have any vomiting.  She has not had any fever or chills.  She has had some mild fatigue.  She thinks this may be related to her A. fib and her possibly medications used to treat this.  She has not had chest pain or shortness of breath.  ROS:  A comprehensive ROS was completed and negative except as noted per HPI  Allergies  Allergen Reactions   Adhesive [Tape] Other (See Comments)    Heart monitor leads- CREATED RED WELTS!!   Other Other (See Comments)    Has had adverse reaction to a migraine medication Heart monitor leads- CREATED RED WELTS!!   Penicillins Itching    Did it involve swelling of the face/tongue/throat, SOB, or low BP? Unk Did it involve sudden or severe rash/hives, skin peeling, or any reaction on the inside of your mouth or nose? Unk Did you need to seek medical attention at a hospital or doctor's office? No When did it last happen? Childhood If all above answers are "NO", may proceed with cephalosporin use.    Simvastatin Itching   Sumatriptan Succinate Other (See Comments)    Hypotension     Past Medical History:  Diagnosis Date   GERD (gastroesophageal reflux disease)    Hypercholesteremia    Hyperlipidemia 12/05/2017   Hypertension    Hypothyroid    Lyme disease    Osteopenia after menopause 05/14/2018   DEXA 04/2018, [facility]: Impression- Osteopenia   T Scores  R Femur Neck- -0.70 L Femur Neck- -1.20 R Total Femur- -0.40 L Total Femur- -1.50 AP  Total Spine: -0.30    SVT (supraventricular tachycardia) (HCC)    TIA (transient ischemic attack) 12/05/2017   TIA (transient ischemic attack)     Past Surgical History:  Procedure Laterality Date   ABDOMINAL HYSTERECTOMY     APPENDECTOMY     BUNIONECTOMY Left    CARDIOVERSION N/A 03/08/2020   Procedure: CARDIOVERSION;  Surgeon: Lewayne Bunting, MD;  Location: [facility] ENDOSCOPY;  Service: Cardiovascular;  Laterality: N/A;   IR RADIOLOGIST EVAL & MGMT  01/05/2018   TEE WITHOUT CARDIOVERSION N/A 03/08/2020   Procedure: TRANSESOPHAGEAL ECHOCARDIOGRAM (TEE);  Surgeon: Lewayne Bunting, MD;  Location: [facility] ENDOSCOPY;  Service: Cardiovascular;  Laterality: N/A;    Social History   Socioeconomic History   Marital status: Married    Spouse name: Not on file   Number of children: 0   Years of education: 18-20   Highest education level: Not on file  Occupational History   Occupation: Retired  Tobacco Use   Smoking status: Never   Smokeless tobacco: Never  Vaping Use   Vaping Use: Never used  Substance and Sexual Activity   Alcohol use: No   Drug use: No   Sexual activity: Yes  Other Topics Concern   Not on file  Social History Narrative   Lives with husband   Caffeine use: Coffee daily   Right handed   Social  Determinants of Health   Financial Resource Strain: Not on file  Food Insecurity: Not on file  Transportation Needs: Not on file  Physical Activity: Not on file  Stress: Not on file  Social Connections: Not on file    Family History  Problem Relation Age of Onset   Stroke Mother    High blood pressure Mother    High Cholesterol Mother    Hypertension Mother    Stroke Father    High blood pressure Father    High Cholesterol Father    Cancer Father    Hypertension Father    Lung cancer Father    Heart attack Maternal Grandfather    Heart disease Maternal Grandfather    Stroke Maternal Grandfather    Early death Paternal Grandmother    Heart attack Paternal  Grandmother    Heart disease Paternal Grandmother    Heart attack Paternal Grandfather    Heart disease Paternal Grandfather     Health Maintenance  Topic Date Due   Hepatitis C Screening  Never done   COVID-19 Vaccine (3 - Booster for Moderna series) 04/20/2020   Zoster Vaccines- Shingrix (1 of 2) 10/15/2021 (Originally 12/20/1995)   INFLUENZA VACCINE  01/10/2022 (Originally 05/13/2021)   MAMMOGRAM  07/15/2022 (Originally 05/13/2019)   COLONOSCOPY (Pts 45-68yrs Insurance coverage will need to be confirmed)  02/11/2024   DEXA SCAN  Completed   HPV VACCINES  Aged Out   TETANUS/TDAP  Discontinued     ----------------------------------------------------------------------------------------------------------------------------------------------------------------------------------------------------------------- Physical Exam BP 135/70 (BP Location: Left Arm, Patient Position: Sitting, Cuff Size: Normal)   Pulse 73   Temp (!) 97.4 F (36.3 C)   Ht 5' 6.5" (1.689 m)   Wt 187 lb (84.8 kg)   SpO2 100%   BMI 29.73 kg/m   Physical Exam Constitutional:      Appearance: Normal appearance.  HENT:     Head: Normocephalic and atraumatic.  Eyes:     General: No scleral icterus. Cardiovascular:     Rate and Rhythm: Normal rate and regular rhythm.  Pulmonary:     Effort: Pulmonary effort is normal.     Breath sounds: Normal breath sounds.  Abdominal:     General: Abdomen is flat. There is no distension.     Palpations: Abdomen is soft.     Tenderness: There is abdominal tenderness (Mild lower abdominal tenderness.).  Musculoskeletal:     Cervical back: Neck supple.     Comments: Tenderness to palpation along the left medial scapular border  Neurological:     General: No focal deficit present.     Mental Status: She is alert.  Psychiatric:        Mood and Affect: Mood normal.        Behavior: Behavior normal.     ------------------------------------------------------------------------------------------------------------------------------------------------------------------------------------------------------------------- Assessment and Plan  Essential hypertension Her blood pressure remains well controlled.  She will continue current antihypertensive medications.  Atrial fibrillation (HCC) She has had some mild fatigue.  She has established with cardiology.  She will continue Bystolic for rate management.  Continue Eliquis for anticoagulation.  Splenic infarction, by imaging She did have some left upper quadrant pain with recent episode.  This has now resolved however we discussed that if symptoms returned we may need to get additional imaging and lab work.   Meds ordered this encounter  Medications   atorvastatin (LIPITOR) 10 MG tablet    Sig: Take 1 tablet (10 mg total) by mouth at bedtime.    Dispense:  90 tablet  Refill:  3    Return in about 6 months (around 01/13/2022) for HTN.    This visit occurred during the SARS-CoV-2 public health emergency.  Safety protocols were in place, including screening questions prior to the visit, additional usage of staff PPE, and extensive cleaning of exam room while observing appropriate contact time as indicated for disinfecting solutions.

## 2021-07-16 ENCOUNTER — Encounter: Payer: Self-pay | Admitting: Family Medicine

## 2021-08-15 DIAGNOSIS — I4819 Other persistent atrial fibrillation: Secondary | ICD-10-CM

## 2021-08-15 MED ORDER — APIXABAN 5 MG PO TABS: 5.0000 mg | ORAL_TABLET | Freq: Two times a day (BID) | ORAL | 1 refills | Status: DC

## 2021-08-31 ENCOUNTER — Encounter: Payer: Self-pay | Admitting: Cardiology

## 2021-09-02 MED ORDER — HYDROCHLOROTHIAZIDE 12.5 MG PO TABS: 12.5000 mg | ORAL_TABLET | Freq: Every day | ORAL | 2 refills | Status: DC

## 2021-10-29 NOTE — Progress Notes (Signed)
HPI:FU atrial fibrillation and hypertension. MRA February 2019 showed no significant stenosis.  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  Seen by interventional radiology and options included arteriogram versus watchful waiting. Also h/o TIA.  Renal Dopplers December 2020 showed no renal artery stenosis. Echocardiogram April 2021 showed normal LV function, mild left ventricular hypertrophy, biatrial enlargement, mild mitral regurgitation and mild tricuspid regurgitation.  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 TEE May 2021 showed normal LV function, mild mitral regurgitation, moderate left atrial enlargement, no left atrial appendage thrombus and mild tricuspid regurgitation. Patient subsequently underwent cardioversion successfully to sinus bradycardia.  Monitor April 2022 showed atrial fibrillation rate controlled.  Since last seen, she has some weakness and dyspnea after taking her medications in the morning but otherwise denies chest pain, palpitations, syncope or bleeding.  No pedal edema.  Current Outpatient Medications  Medication Sig Dispense Refill   acetaminophen (TYLENOL) 500 MG tablet Take 500 mg by mouth every 6 (six) hours as needed (for pain.).     amLODipine (NORVASC) 2.5 MG tablet Take 1 tablet (2.5 mg total) by mouth at bedtime. 90 tablet 3   apixaban (ELIQUIS) 5 MG TABS tablet Take 1 tablet (5 mg total) by mouth 2 (two) times daily. 180 tablet 1   atorvastatin (LIPITOR) 10 MG tablet Take 1 tablet (10 mg total) by mouth at bedtime. 90 tablet 3   Cholecalciferol (VITAMIN D-3) 25 MCG (1000 UT) CAPS Take 1,000 Units by mouth daily.     hydrochlorothiazide (HYDRODIURIL) 12.5 MG tablet Take 1 tablet (12.5 mg total) by mouth daily. 90 tablet 2   losartan (COZAAR) 50 MG tablet Take 1 tablet (50 mg total) by mouth 2 (two) times daily. 180 tablet 3   nebivolol (BYSTOLIC) 5 MG tablet Take 1 tablet (5 mg total) by mouth 2 (two) times daily.  180 tablet 3   omeprazole (PRILOSEC) 40 MG capsule Take 1 capsule (40 mg total) by mouth daily. 90 capsule 3   Probiotic Product (DIGESTIVE ADVANTAGE PO) Take 1 capsule by mouth daily.     vitamin C (ASCORBIC ACID) 500 MG tablet Take 500 mg by mouth daily.     Wheat Dextrin (BENEFIBER DRINK MIX PO) Take 4.2 g by mouth daily. Mix 1 teaspoonful of powder into 6-8 ounces of coffee and drink in the morning (once a day)     No current facility-administered medications for this visit.     Past Medical History:  Diagnosis Date   GERD (gastroesophageal reflux disease)    Hypercholesteremia    Hyperlipidemia 12/05/2017   Hypertension    Hypothyroid    Lyme disease    Osteopenia after menopause 05/14/2018   DEXA 04/2018, [facility]: Impression- Osteopenia   T Scores  R Femur Neck- -0.70 L Femur Neck- -1.20 R Total Femur- -0.40 L Total Femur- -1.50 AP Total Spine: -0.30    SVT (supraventricular tachycardia) (HCC)    TIA (transient ischemic attack) 12/05/2017   TIA (transient ischemic attack)     Past Surgical History:  Procedure Laterality Date   ABDOMINAL HYSTERECTOMY     APPENDECTOMY     BUNIONECTOMY Left    CARDIOVERSION N/A 03/08/2020   Procedure: CARDIOVERSION;  Surgeon: Lelon Perla, MD;  Location: [facility] ENDOSCOPY;  Service: Cardiovascular;  Laterality: N/A;   IR RADIOLOGIST EVAL & MGMT  01/05/2018   TEE WITHOUT CARDIOVERSION N/A 03/08/2020   Procedure: TRANSESOPHAGEAL ECHOCARDIOGRAM (TEE);  Surgeon: Lelon Perla, MD;  Location: [facility] ENDOSCOPY;  Service: Cardiovascular;  Laterality: N/A;    Social History   Socioeconomic History   Marital status: Married    Spouse name: Not on file   Number of children: 0   Years of education: 18-20   Highest education level: Not on file  Occupational History   Occupation: Retired  Tobacco Use   Smoking status: Never   Smokeless tobacco: Never  Vaping Use   Vaping Use: Never used  Substance and Sexual Activity   Alcohol use: No   Drug use: No    Sexual activity: Yes  Other Topics Concern   Not on file  Social History Narrative   Lives with husband   Caffeine use: Coffee daily   Right handed   Social Determinants of Health   Financial Resource Strain: Not on file  Food Insecurity: Not on file  Transportation Needs: Not on file  Physical Activity: Not on file  Stress: Not on file  Social Connections: Not on file  Intimate Partner Violence: Not on file    Family History  Problem Relation Age of Onset   Stroke Mother    High blood pressure Mother    High Cholesterol Mother    Hypertension Mother    Stroke Father    High blood pressure Father    High Cholesterol Father    Cancer Father    Hypertension Father    Lung cancer Father    Heart attack Maternal Grandfather    Heart disease Maternal Grandfather    Stroke Maternal Grandfather    Early death Paternal Grandmother    Heart attack Paternal Grandmother    Heart disease Paternal Grandmother    Heart attack Paternal Grandfather    Heart disease Paternal Grandfather     ROS: no fevers or chills, productive cough, hemoptysis, dysphasia, odynophagia, melena, hematochezia, dysuria, hematuria, rash, seizure activity, orthopnea, PND, pedal edema, claudication. Remaining systems are negative.  Physical Exam: Well-developed well-nourished in no acute distress.  Skin is warm and dry.  HEENT is normal.  Neck is supple.  Chest is clear to auscultation with normal expansion.  Cardiovascular exam is irregular Abdominal exam nontender or distended. No masses palpated. Extremities show no edema. neuro grossly intact   A/P  1 permanent atrial fibrillation-plan to continue beta-blocker at present dose for rate control.  Continue apixaban.  Note she had a previous embolic event and therefore requires long-term anticoagulation.  2 hypertension-blood pressure controlled.  Continue present medications.  Note she has some weakness in the mornings after taking her  medicines.  I asked her to check her blood pressure at that time and if it is low we will adjust her regimen.  3 prior splenic infarct/TIA-lifelong anticoagulation as outlined under #1.  4 hyperlipidemia-continue statin.  5 cerebral aneurysm-managed by neurology and interventional radiology.  Kirk Ruths, MD

## 2021-11-02 ENCOUNTER — Encounter: Payer: Self-pay | Admitting: Cardiology

## 2021-11-06 ENCOUNTER — Other Ambulatory Visit: Payer: Self-pay

## 2021-11-06 ENCOUNTER — Encounter: Payer: Self-pay | Admitting: Cardiology

## 2021-11-06 ENCOUNTER — Ambulatory Visit (INDEPENDENT_AMBULATORY_CARE_PROVIDER_SITE_OTHER): Payer: Medicare Other | Admitting: Cardiology

## 2021-11-06 VITALS — BP 124/68 | HR 76 | Ht 66.5 in | Wt 185.8 lb

## 2021-11-06 DIAGNOSIS — E7800 Pure hypercholesterolemia, unspecified: Secondary | ICD-10-CM | POA: Diagnosis not present

## 2021-11-06 DIAGNOSIS — I4821 Permanent atrial fibrillation: Secondary | ICD-10-CM

## 2021-11-06 DIAGNOSIS — I10 Essential (primary) hypertension: Secondary | ICD-10-CM

## 2021-11-06 NOTE — Patient Instructions (Signed)
°  Follow-Up: °At [facility], you and your health needs are our priority.  As part of our continuing mission to provide you with exceptional heart care, we have created designated Provider Care Teams.  These Care Teams include your primary Cardiologist (physician) and Advanced Practice Providers (APPs -  Physician Assistants and Nurse Practitioners) who all work together to provide you with the care you need, when you need it. ° °We recommend signing up for the patient portal called "MyChart".  Sign up information is provided on this After Visit Summary.  MyChart is used to connect with patients for Virtual Visits (Telemedicine).  Patients are able to view lab/test results, encounter notes, upcoming appointments, etc.  Non-urgent messages can be sent to your provider as well.   °To learn more about what you can do with MyChart, go to [email or web address].   ° °Your next appointment:   °6 month(s) ° °The format for your next appointment:   °In Person ° °Provider:   °BRIAN CRENSHAW MD  ° ° ° °

## 2021-11-09 ENCOUNTER — Encounter: Payer: Self-pay | Admitting: Cardiology

## 2021-11-25 ENCOUNTER — Other Ambulatory Visit: Payer: Self-pay | Admitting: Cardiology

## 2021-11-25 DIAGNOSIS — I10 Essential (primary) hypertension: Secondary | ICD-10-CM

## 2021-12-07 ENCOUNTER — Encounter: Payer: Self-pay | Admitting: Family Medicine

## 2021-12-09 MED ORDER — OMEPRAZOLE 40 MG PO CPDR: 40.0000 mg | DELAYED_RELEASE_CAPSULE | Freq: Every day | ORAL | 3 refills | Status: DC

## 2021-12-09 NOTE — Telephone Encounter (Signed)
Rx completed

## 2021-12-21 ENCOUNTER — Encounter: Payer: Self-pay | Admitting: Cardiology

## 2021-12-25 ENCOUNTER — Telehealth: Payer: Self-pay

## 2021-12-25 NOTE — Telephone Encounter (Signed)
**Note De-Identified Allison Richardson Obfuscation** Allison Richardson [name] - PA Case ID: [id number] ?Outcome: This request has been approved using information available on the patient's profile.  ?Type:Prior Auth;Coverage Start Date:11/25/2021;Coverage End Date:12/25/2022; ?Drug ?Eliquis 5MG  tablets ?Form: [employer] Electronic PA Form [phone] NCPDP) ? ?I have notified the pt Allison Richardson his [facility] message of this approval. ?

## 2022-01-13 ENCOUNTER — Ambulatory Visit: Payer: Medicare Other | Admitting: Family Medicine

## 2022-01-21 ENCOUNTER — Ambulatory Visit (INDEPENDENT_AMBULATORY_CARE_PROVIDER_SITE_OTHER): Payer: Medicare Other | Admitting: Family Medicine

## 2022-01-21 ENCOUNTER — Encounter: Payer: Self-pay | Admitting: Family Medicine

## 2022-01-21 VITALS — BP 144/84 | HR 72 | Ht 66.5 in | Wt 187.0 lb

## 2022-01-21 DIAGNOSIS — E538 Deficiency of other specified B group vitamins: Secondary | ICD-10-CM | POA: Diagnosis not present

## 2022-01-21 DIAGNOSIS — I10 Essential (primary) hypertension: Secondary | ICD-10-CM

## 2022-01-21 DIAGNOSIS — R7301 Impaired fasting glucose: Secondary | ICD-10-CM

## 2022-01-21 DIAGNOSIS — E7800 Pure hypercholesterolemia, unspecified: Secondary | ICD-10-CM | POA: Diagnosis not present

## 2022-01-21 DIAGNOSIS — I4821 Permanent atrial fibrillation: Secondary | ICD-10-CM

## 2022-01-21 NOTE — Assessment & Plan Note (Signed)
Blood pressures fairly well controlled at this time.  Recommend continuation of current medications. ?

## 2022-01-21 NOTE — Assessment & Plan Note (Signed)
Managed by cardiology.  She denies significant symptoms at this time and rate is controlled with Bystolic.  She remains anticoagulated eliquis. ?

## 2022-01-21 NOTE — Assessment & Plan Note (Addendum)
Doing well with atorvastatin.  Recommend continuation.  Updating lipid panel. ?

## 2022-01-21 NOTE — Progress Notes (Signed)
?Allison Richardson - 76 y.o. female MRN [id number]  Date of birth: 30-Aug-1946 ? ?Subjective ?Chief Complaint  ?Patient presents with  ? Knee Pain  ? ? ?HPI ?Allison Richardson is a 75 year old female here today for follow-up visit.  She reports she is doing quite well at this time.  She thinks she may have turned the wrong way and irritated her knee but this seems to be getting better. ? ?She continues on amlodipine, hydrochlorothiazide, losartan and Bystolic for management of hypertension and atrial fibrillation.  She does see cardiology regularly as well.  She is on Eliquis for anticoagulation.  Overall tolerating all his medications well without side effects.  She denies chest pain, shortness of breath, palpitations, increased fatigue or dizziness. ? ?She continues to tolerate atorvastatin well for management of hyperlipidemia. ? ?ROS:  A comprehensive ROS was completed and negative except as noted per HPI ? ? ?Allergies  ?Allergen Reactions  ? Adhesive [Tape] Other (See Comments)  ?  Heart monitor leads- CREATED RED WELTS!!  ? Other Other (See Comments)  ?  Has had adverse reaction to a migraine medication ?Heart monitor leads- CREATED RED WELTS!!  ? Penicillins Itching  ?  Did it involve swelling of the face/tongue/throat, SOB, or low BP? Unk ?Did it involve sudden or severe rash/hives, skin peeling, or any reaction on the inside of your mouth or nose? Unk ?Did you need to seek medical attention at a hospital or doctor's office? No ?When did it last happen? Childhood ?If all above answers are "NO", may proceed with cephalosporin use. ?  ? Simvastatin Itching  ? Sumatriptan Succinate Other (See Comments)  ?  Hypotension ?  ? ? ?Past Medical History:  ?Diagnosis Date  ? GERD (gastroesophageal reflux disease)   ? Hypercholesteremia   ? Hyperlipidemia 12/05/2017  ? Hypertension   ? Hypothyroid   ? Lyme disease   ? Osteopenia after menopause 05/14/2018  ? DEXA 04/2018, [facility]: Impression- Osteopenia   T Scores  R Femur Neck- -0.70 L  Femur Neck- -1.20 R Total Femur- -0.40 L Total Femur- -1.50 AP Total Spine: -0.30   ? SVT (supraventricular tachycardia) (HCC)   ? TIA (transient ischemic attack) 12/05/2017  ? TIA (transient ischemic attack)   ? ? ?Past Surgical History:  ?Procedure Laterality Date  ? ABDOMINAL HYSTERECTOMY    ? APPENDECTOMY    ? BUNIONECTOMY Left   ? CARDIOVERSION N/A 03/08/2020  ? Procedure: CARDIOVERSION;  Surgeon: Lewayne Bunting, MD;  Location: [facility] ENDOSCOPY;  Service: Cardiovascular;  Laterality: N/A;  ? IR RADIOLOGIST EVAL & MGMT  01/05/2018  ? TEE WITHOUT CARDIOVERSION N/A 03/08/2020  ? Procedure: TRANSESOPHAGEAL ECHOCARDIOGRAM (TEE);  Surgeon: Lewayne Bunting, MD;  Location: [facility] ENDOSCOPY;  Service: Cardiovascular;  Laterality: N/A;  ? ? ?Social History  ? ?Socioeconomic History  ? Marital status: Married  ?  Spouse name: Not on file  ? Number of children: 0  ? Years of education: 18-20  ? Highest education level: Not on file  ?Occupational History  ? Occupation: Retired  ?Tobacco Use  ? Smoking status: Never  ? Smokeless tobacco: Never  ?Vaping Use  ? Vaping Use: Never used  ?Substance and Sexual Activity  ? Alcohol use: No  ? Drug use: No  ? Sexual activity: Yes  ?Other Topics Concern  ? Not on file  ?Social History Narrative  ? Lives with husband  ? Caffeine use: Coffee daily  ? Right handed  ? ?Social Determinants of Health  ? ?Financial  Resource Strain: Not on file  ?Food Insecurity: Not on file  ?Transportation Needs: Not on file  ?Physical Activity: Not on file  ?Stress: Not on file  ?Social Connections: Not on file  ? ? ?Family History  ?Problem Relation Age of Onset  ? Stroke Mother   ? High blood pressure Mother   ? High Cholesterol Mother   ? Hypertension Mother   ? Stroke Father   ? High blood pressure Father   ? High Cholesterol Father   ? Cancer Father   ? Hypertension Father   ? Lung cancer Father   ? Heart attack Maternal Grandfather   ? Heart disease Maternal Grandfather   ? Stroke Maternal Grandfather   ?  Early death Paternal Grandmother   ? Heart attack Paternal Grandmother   ? Heart disease Paternal Grandmother   ? Heart attack Paternal Grandfather   ? Heart disease Paternal Grandfather   ? ? ?Health Maintenance  ?Topic Date Due  ? Hepatitis C Screening  Never done  ? Zoster Vaccines- Shingrix (1 of 2) Never done  ? COVID-19 Vaccine (3 - Booster for Moderna series) 01/17/2020  ? MAMMOGRAM  07/15/2022 (Originally 05/13/2019)  ? INFLUENZA VACCINE  05/13/2022  ? Pneumonia Vaccine 43+ Years old  Completed  ? DEXA SCAN  Completed  ? HPV VACCINES  Aged Out  ? COLONOSCOPY (Pts 45-42yrs Insurance coverage will need to be confirmed)  Discontinued  ? TETANUS/TDAP  Discontinued  ? ? ? ?----------------------------------------------------------------------------------------------------------------------------------------------------------------------------------------------------------------- ?Physical Exam ?BP (!) 144/84 (BP Location: Left Arm, Patient Position: Sitting, Cuff Size: Normal)   Pulse 72   Ht 5' 6.5" (1.689 m)   Wt 187 lb (84.8 kg)   SpO2 100%   BMI 29.73 kg/m?  ? ?Physical Exam ?Constitutional:   ?   Appearance: Normal appearance.  ?Eyes:  ?   General: No scleral icterus. ?Musculoskeletal:  ?   Cervical back: Neck supple.  ?Neurological:  ?   General: No focal deficit present.  ?   Mental Status: She is alert.  ?Psychiatric:     ?   Mood and Affect: Mood normal.     ?   Behavior: Behavior normal.  ? ? ?------------------------------------------------------------------------------------------------------------------------------------------------------------------------------------------------------------------- ?Assessment and Plan ? ?Atrial fibrillation (HCC) ?Managed by cardiology.  She denies significant symptoms at this time and rate is controlled with Bystolic.  She remains anticoagulated eliquis. ? ?Essential hypertension ?Blood pressures fairly well controlled at this time.  Recommend continuation of  current medications. ? ?Hyperlipidemia ?Doing well with atorvastatin.  Recommend continuation.  Updating lipid panel. ? ? ?No orders of the defined types were placed in this encounter. ? ? ?Return in about 6 months (around 07/23/2022) for HTN/HLD. ? ? ? ?This visit occurred during the SARS-CoV-2 public health emergency.  Safety protocols were in place, including screening questions prior to the visit, additional usage of staff PPE, and extensive cleaning of exam room while observing appropriate contact time as indicated for disinfecting solutions.  ?Is a frequent fracture. ?

## 2022-01-25 LAB — CMP14+EGFR
ALT: 14 IU/L (ref 0–32)
AST: 18 IU/L (ref 0–40)
Albumin/Globulin Ratio: 1.8 (ref 1.2–2.2)
Albumin: 4.2 g/dL (ref 3.7–4.7)
Alkaline Phosphatase: 89 IU/L (ref 44–121)
BUN/Creatinine Ratio: 22 (ref 12–28)
BUN: 17 mg/dL (ref 8–27)
Bilirubin Total: 0.5 mg/dL (ref 0.0–1.2)
CO2: 28 mmol/L (ref 20–29)
Calcium: 9.4 mg/dL (ref 8.7–10.3)
Chloride: 102 mmol/L (ref 96–106)
Creatinine, Ser: 0.77 mg/dL (ref 0.57–1.00)
Globulin, Total: 2.3 g/dL (ref 1.5–4.5)
Glucose: 104 mg/dL — ABNORMAL HIGH (ref 70–99)
Potassium: 4.7 mmol/L (ref 3.5–5.2)
Sodium: 142 mmol/L (ref 134–144)
Total Protein: 6.5 g/dL (ref 6.0–8.5)
eGFR: 80 mL/min/{1.73_m2} (ref >59)

## 2022-01-25 LAB — CBC WITH DIFFERENTIAL/PLATELET
Basophils Absolute: 0.1 10*3/uL (ref 0.0–0.2)
Basos: 1 %
EOS (ABSOLUTE): 0.1 10*3/uL (ref 0.0–0.4)
Eos: 1 %
Hematocrit: 40.3 % (ref 34.0–46.6)
Hemoglobin: 13 g/dL (ref 11.1–15.9)
Immature Grans (Abs): 0 10*3/uL (ref 0.0–0.1)
Immature Granulocytes: 0 %
Lymphocytes Absolute: 1.8 10*3/uL (ref 0.7–3.1)
Lymphs: 30 %
MCH: 28.8 pg (ref 26.6–33.0)
MCHC: 32.3 g/dL (ref 31.5–35.7)
MCV: 89 fL (ref 79–97)
Monocytes Absolute: 0.3 10*3/uL (ref 0.1–0.9)
Monocytes: 5 %
Neutrophils Absolute: 3.7 10*3/uL (ref 1.4–7.0)
Neutrophils: 63 %
Platelets: 264 10*3/uL (ref 150–450)
RBC: 4.52 x10E6/uL (ref 3.77–5.28)
RDW: 12.7 % (ref 11.7–15.4)
WBC: 6 10*3/uL (ref 3.4–10.8)

## 2022-01-25 LAB — LIPID PANEL
Chol/HDL Ratio: 3.6 ratio (ref 0.0–4.4)
Cholesterol, Total: 173 mg/dL (ref 100–199)
HDL: 48 mg/dL (ref >39)
LDL Chol Calc (NIH): 102 mg/dL — ABNORMAL HIGH (ref 0–99)
Triglycerides: 127 mg/dL (ref 0–149)
VLDL Cholesterol Cal: 23 mg/dL (ref 5–40)

## 2022-01-25 LAB — VITAMIN B12: Vitamin B-12: 267 pg/mL (ref 232–1245)

## 2022-01-25 LAB — HEMOGLOBIN A1C
Est. average glucose Bld gHb Est-mCnc: 137 mg/dL
Hgb A1c MFr Bld: 6.4 % — ABNORMAL HIGH (ref 4.8–5.6)

## 2022-01-25 LAB — TSH: TSH: 2.29 u[IU]/mL (ref 0.450–4.500)

## 2022-01-28 ENCOUNTER — Encounter: Payer: Self-pay | Admitting: Cardiology

## 2022-01-28 NOTE — Telephone Encounter (Signed)
Patient reported having a nosebleed that lasted 15 minutes last night. She denies vomiting blood, blood in urine, or blood in stool. She is not bruising. Spoke with Dr. Ludwig Clarks nurse and Dr. [place] (DOD) who both recommend for patient to use a humidifier and saline nasal spray. This was explained to patient. Also, recommended to patient that if she begins to bleed again, where it cannot be controlled at home, she needs to go to the ED for bleeding management and to contact clinic to advise Dr. Jens Som. Pt voiced understanding. ?

## 2022-02-08 ENCOUNTER — Encounter: Payer: Self-pay | Admitting: Cardiology

## 2022-02-08 DIAGNOSIS — I4819 Other persistent atrial fibrillation: Secondary | ICD-10-CM

## 2022-02-10 MED ORDER — AMLODIPINE BESYLATE 2.5 MG PO TABS: 2.5000 mg | ORAL_TABLET | Freq: Every day | ORAL | 3 refills | Status: DC

## 2022-02-10 MED ORDER — APIXABAN 5 MG PO TABS: 5.0000 mg | ORAL_TABLET | Freq: Two times a day (BID) | ORAL | 3 refills | Status: DC

## 2022-02-10 MED ORDER — NEBIVOLOL HCL 5 MG PO TABS: 5.0000 mg | ORAL_TABLET | Freq: Two times a day (BID) | ORAL | 3 refills | Status: DC

## 2022-02-23 ENCOUNTER — Encounter: Payer: Self-pay | Admitting: Cardiology

## 2022-02-23 DIAGNOSIS — I10 Essential (primary) hypertension: Secondary | ICD-10-CM

## 2022-02-24 MED ORDER — LOSARTAN POTASSIUM 50 MG PO TABS: ORAL_TABLET | ORAL | 3 refills | Status: DC

## 2022-05-14 NOTE — Progress Notes (Signed)
HPI:FU atrial fibrillation and hypertension. MRA February 2019 showed no significant stenosis.  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  Seen by interventional radiology and options included arteriogram versus watchful waiting. Also h/o TIA.  Renal Dopplers December 2020 showed no renal artery stenosis. Echocardiogram April 2021 showed normal LV function, mild left ventricular hypertrophy, biatrial enlargement, mild mitral regurgitation and mild tricuspid regurgitation.  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 TEE May 2021 showed normal LV function, mild mitral regurgitation, moderate left atrial enlargement, no left atrial appendage thrombus and mild tricuspid regurgitation. Patient subsequently underwent cardioversion successfully to sinus bradycardia.  FU Monitor April 2022 showed atrial fibrillation rate controlled.  Now treated with rate control. Since last seen, she denies dyspnea, chest pain, palpitations, syncope.  Minimal pedal edema.  Current Outpatient Medications  Medication Sig Dispense Refill   acetaminophen (TYLENOL) 500 MG tablet Take 500 mg by mouth every 6 (six) hours as needed (for pain.).     amLODipine (NORVASC) 2.5 MG tablet Take 1 tablet (2.5 mg total) by mouth at bedtime. 90 tablet 3   apixaban (ELIQUIS) 5 MG TABS tablet Take 1 tablet (5 mg total) by mouth 2 (two) times daily. 180 tablet 3   atorvastatin (LIPITOR) 10 MG tablet Take 1 tablet (10 mg total) by mouth at bedtime. 90 tablet 3   Cholecalciferol (VITAMIN D-3) 25 MCG (1000 UT) CAPS Take 1,000 Units by mouth daily.     hydrochlorothiazide (HYDRODIURIL) 12.5 MG tablet Take 1 tablet (12.5 mg total) by mouth daily. 90 tablet 0   losartan (COZAAR) 50 MG tablet TAKE ONE (1) TABLET BY MOUTH TWO (2) TIMES DAILY 180 tablet 0   nebivolol (BYSTOLIC) 5 MG tablet Take 1 tablet (5 mg total) by mouth 2 (two) times daily. 180 tablet 3   omeprazole (PRILOSEC) 40 MG capsule Take 1  capsule (40 mg total) by mouth daily. 90 capsule 3   Probiotic Product (DIGESTIVE ADVANTAGE PO) Take 1 capsule by mouth daily.     vitamin C (ASCORBIC ACID) 500 MG tablet Take 500 mg by mouth daily.     Wheat Dextrin (BENEFIBER DRINK MIX PO) Take 4.2 g by mouth daily. Mix 1 teaspoonful of powder into 6-8 ounces of coffee and drink in the morning (once a day)     No current facility-administered medications for this visit.     Past Medical History:  Diagnosis Date   GERD (gastroesophageal reflux disease)    Hypercholesteremia    Hyperlipidemia 12/05/2017   Hypertension    Hypothyroid    Lyme disease    Osteopenia after menopause 05/14/2018   DEXA 04/2018, [facility]: Impression- Osteopenia   T Scores  R Femur Neck- -0.70 L Femur Neck- -1.20 R Total Femur- -0.40 L Total Femur- -1.50 AP Total Spine: -0.30    SVT (supraventricular tachycardia) (HCC)    TIA (transient ischemic attack) 12/05/2017   TIA (transient ischemic attack)     Past Surgical History:  Procedure Laterality Date   ABDOMINAL HYSTERECTOMY     APPENDECTOMY     BUNIONECTOMY Left    CARDIOVERSION N/A 03/08/2020   Procedure: CARDIOVERSION;  Surgeon: Lewayne Bunting, MD;  Location: [facility] ENDOSCOPY;  Service: Cardiovascular;  Laterality: N/A;   IR RADIOLOGIST EVAL & MGMT  01/05/2018   TEE WITHOUT CARDIOVERSION N/A 03/08/2020   Procedure: TRANSESOPHAGEAL ECHOCARDIOGRAM (TEE);  Surgeon: Lewayne Bunting, MD;  Location: [facility] ENDOSCOPY;  Service:  Cardiovascular;  Laterality: N/A;    Social History   Socioeconomic History   Marital status: Married    Spouse name: Not on file   Number of children: 0   Years of education: 18-20   Highest education level: Not on file  Occupational History   Occupation: Retired  Tobacco Use   Smoking status: Never   Smokeless tobacco: Never  Vaping Use   Vaping Use: Never used  Substance and Sexual Activity   Alcohol use: No   Drug use: No   Sexual activity: Yes  Other Topics Concern   Not on  file  Social History Narrative   Lives with husband   Caffeine use: Coffee daily   Right handed   Social Determinants of Health   Financial Resource Strain: Not on file  Food Insecurity: Not on file  Transportation Needs: Not on file  Physical Activity: Not on file  Stress: Not on file  Social Connections: Not on file  Intimate Partner Violence: Not on file    Family History  Problem Relation Age of Onset   Stroke Mother    High blood pressure Mother    High Cholesterol Mother    Hypertension Mother    Stroke Father    High blood pressure Father    High Cholesterol Father    Cancer Father    Hypertension Father    Lung cancer Father    Heart attack Maternal Grandfather    Heart disease Maternal Grandfather    Stroke Maternal Grandfather    Early death Paternal Grandmother    Heart attack Paternal Grandmother    Heart disease Paternal Grandmother    Heart attack Paternal Grandfather    Heart disease Paternal Grandfather     ROS: no fevers or chills, productive cough, hemoptysis, dysphasia, odynophagia, melena, hematochezia, dysuria, hematuria, rash, seizure activity, orthopnea, PND, claudication. Remaining systems are negative.  Physical Exam: Well-developed well-nourished in no acute distress.  Skin is warm and dry.  HEENT is normal.  Neck is supple.  Chest is clear to auscultation with normal expansion.  Cardiovascular exam is irregular Abdominal exam nontender or distended. No masses palpated. Extremities show no edema. neuro grossly intact  ECG-atrial fibrillation at a rate of 69, no ST changes.  Personally reviewed  A/P  1 permanent atrial fibrillation-continue apixaban and beta-blocker.  She will require lifelong anticoagulation as she had an embolic event previously.  2 previous splenic infarct/TIA-continue apixaban.  3 hypertension-patient's blood pressure has been borderline.  Discontinue amlodipine and follow blood pressure.  This may also help  with lower extremity edema.  4 hyperlipidemia-continue statin.  5 cerebral aneurysm-Per neurology/interventional radiology.  Olga Millers, MD

## 2022-05-24 ENCOUNTER — Encounter: Payer: Self-pay | Admitting: Cardiology

## 2022-05-24 DIAGNOSIS — I10 Essential (primary) hypertension: Secondary | ICD-10-CM

## 2022-05-26 MED ORDER — HYDROCHLOROTHIAZIDE 12.5 MG PO TABS: 12.5000 mg | ORAL_TABLET | Freq: Every day | ORAL | 0 refills | Status: DC

## 2022-05-26 MED ORDER — LOSARTAN POTASSIUM 50 MG PO TABS: ORAL_TABLET | ORAL | 0 refills | Status: DC

## 2022-05-28 ENCOUNTER — Encounter: Payer: Self-pay | Admitting: Cardiology

## 2022-05-28 ENCOUNTER — Ambulatory Visit (INDEPENDENT_AMBULATORY_CARE_PROVIDER_SITE_OTHER): Payer: Medicare Other | Admitting: Cardiology

## 2022-05-28 VITALS — BP 114/64 | HR 69 | Ht 66.5 in | Wt 186.0 lb

## 2022-05-28 DIAGNOSIS — I10 Essential (primary) hypertension: Secondary | ICD-10-CM | POA: Diagnosis not present

## 2022-05-28 DIAGNOSIS — E7800 Pure hypercholesterolemia, unspecified: Secondary | ICD-10-CM

## 2022-05-28 DIAGNOSIS — I4819 Other persistent atrial fibrillation: Secondary | ICD-10-CM

## 2022-05-28 NOTE — Patient Instructions (Signed)
Medication Instructions:   DISCONTINUE Amlodipine.   *If you need a refill on your cardiac medications before your next appointment, please call your pharmacy*   Lab Work:  None ordered.   If you have labs (blood work) drawn today and your tests are completely normal, you will receive your results only by: MyChart Message (if you have MyChart) OR A paper copy in the mail If you have any lab test that is abnormal or we need to change your treatment, we will call you to review the results.   Testing/Procedures:  None ordered.   Follow-Up: At [facility], you and your health needs are our priority.  As part of our continuing mission to provide you with exceptional heart care, we have created designated Provider Care Teams.  These Care Teams include your primary Cardiologist (physician) and Advanced Practice Providers (APPs -  Physician Assistants and Nurse Practitioners) who all work together to provide you with the care you need, when you need it.  We recommend signing up for the patient portal called "MyChart".  Sign up information is provided on this After Visit Summary.  MyChart is used to connect with patients for Virtual Visits (Telemedicine).  Patients are able to view lab/test results, encounter notes, upcoming appointments, etc.  Non-urgent messages can be sent to your provider as well.   To learn more about what you can do with MyChart, go to [email or web address].    Your next appointment:   1 year(s)  The format for your next appointment:   In Person  Provider:   Olga Millers, MD    Other Instructions   Your physician wants you to follow-up in: 1 year with Dr. Jens Som.  You will receive a reminder letter in the mail two months in advance. If you don't receive a letter, please call our office to schedule the follow-up appointment.   Important Information About Sugar

## 2022-06-04 ENCOUNTER — Encounter: Payer: Self-pay | Admitting: General Practice

## 2022-07-23 ENCOUNTER — Ambulatory Visit (INDEPENDENT_AMBULATORY_CARE_PROVIDER_SITE_OTHER): Payer: Medicare Other | Admitting: Family Medicine

## 2022-07-23 ENCOUNTER — Encounter: Payer: Self-pay | Admitting: Family Medicine

## 2022-07-23 VITALS — BP 119/77 | HR 66 | Ht 66.5 in | Wt 187.0 lb

## 2022-07-23 DIAGNOSIS — I10 Essential (primary) hypertension: Secondary | ICD-10-CM | POA: Diagnosis not present

## 2022-07-23 DIAGNOSIS — R7301 Impaired fasting glucose: Secondary | ICD-10-CM | POA: Diagnosis not present

## 2022-07-23 DIAGNOSIS — I4821 Permanent atrial fibrillation: Secondary | ICD-10-CM | POA: Diagnosis not present

## 2022-07-23 DIAGNOSIS — R7303 Prediabetes: Secondary | ICD-10-CM | POA: Insufficient documentation

## 2022-07-23 DIAGNOSIS — E7800 Pure hypercholesterolemia, unspecified: Secondary | ICD-10-CM

## 2022-07-23 DIAGNOSIS — R1032 Left lower quadrant pain: Secondary | ICD-10-CM | POA: Insufficient documentation

## 2022-07-23 LAB — POCT GLYCOSYLATED HEMOGLOBIN (HGB A1C): HbA1c, POC (prediabetic range): 6.1 % (ref 5.7–6.4)

## 2022-07-23 NOTE — Assessment & Plan Note (Signed)
Blood pressure is well-controlled at this time.  Recommend continuation of current medications for management of hypertension.   

## 2022-07-23 NOTE — Assessment & Plan Note (Signed)
Rate controlled at this time.  She will continue management per cardiology.  Remains anticoagulated with Eliquis

## 2022-07-23 NOTE — Assessment & Plan Note (Signed)
Tolerating atorvastatin well at current strength.  We will plan to continue. ?

## 2022-07-23 NOTE — Progress Notes (Signed)
Phylicia Mcgaugh - 76 y.o. female MRN [id number]  Date of birth: 1945-11-19  Subjective Chief Complaint  Patient presents with   Hypertension   Hyperlipidemia    HPI Allison Richardson is a 76 year old female here today for follow-up visit.  She reports she is doing pretty well at this time.  Having some mild left lower quadrant pain.  She has had this for a few weeks.  Has improved some.  Having some occasional loose stools as well.  She thinks this is related to her history of IBS.  Denies blood in her stool.  No fevers or chills.  Blood pressure remains stable with amlodipine, hydrochlorothiazide, losartan and nebivolol.  No notable side effects from medication.  Additionally, she continues to see cardiology for management of atrial fibrillation.  Denies any symptoms including palpitations, dyspnea or fatigue.  Remains anticoagulated with Eliquis and tolerating well.  She does feel like her GERD is slightly worsened.  She was given her medications and found that several of these may contribute to reflux.  She currently takes omeprazole.  Tolerating atorvastatin well at current strength.  ROS:  A comprehensive ROS was completed and negative except as noted per HPI  Allergies  Allergen Reactions   Adhesive [Tape] Other (See Comments)    Heart monitor leads- CREATED RED WELTS!!   Other Other (See Comments)    Has had adverse reaction to a migraine medication Heart monitor leads- CREATED RED WELTS!!   Penicillins Itching    Did it involve swelling of the face/tongue/throat, SOB, or low BP? Unk Did it involve sudden or severe rash/hives, skin peeling, or any reaction on the inside of your mouth or nose? Unk Did you need to seek medical attention at a hospital or doctor's office? No When did it last happen? Childhood If all above answers are "NO", may proceed with cephalosporin use.    Simvastatin Itching   Sumatriptan Succinate Other (See Comments)    Hypotension     Past Medical  History:  Diagnosis Date   GERD (gastroesophageal reflux disease)    Hypercholesteremia    Hyperlipidemia 12/05/2017   Hypertension    Hypothyroid    Lyme disease    Osteopenia after menopause 05/14/2018   DEXA 04/2018, [facility]: Impression- Osteopenia   T Scores  R Femur Neck- -0.70 L Femur Neck- -1.20 R Total Femur- -0.40 L Total Femur- -1.50 AP Total Spine: -0.30    SVT (supraventricular tachycardia)    TIA (transient ischemic attack) 12/05/2017   TIA (transient ischemic attack)     Past Surgical History:  Procedure Laterality Date   ABDOMINAL HYSTERECTOMY     APPENDECTOMY     BUNIONECTOMY Left    CARDIOVERSION N/A 03/08/2020   Procedure: CARDIOVERSION;  Surgeon: Lelon Perla, MD;  Location: [facility];  Service: Cardiovascular;  Laterality: N/A;   IR RADIOLOGIST EVAL & MGMT  01/05/2018   TEE WITHOUT CARDIOVERSION N/A 03/08/2020   Procedure: TRANSESOPHAGEAL ECHOCARDIOGRAM (TEE);  Surgeon: Lelon Perla, MD;  Location: [facility] ENDOSCOPY;  Service: Cardiovascular;  Laterality: N/A;    Social History   Socioeconomic History   Marital status: Married    Spouse name: Not on file   Number of children: 0   Years of education: 18-20   Highest education level: Not on file  Occupational History   Occupation: Retired  Tobacco Use   Smoking status: Never   Smokeless tobacco: Never  Vaping Use   Vaping Use: Never used  Substance and Sexual Activity  Alcohol use: No   Drug use: No   Sexual activity: Yes  Other Topics Concern   Not on file  Social History Narrative   Lives with husband   Caffeine use: Coffee daily   Right handed   Social Determinants of Health   Financial Resource Strain: Not on file  Food Insecurity: Not on file  Transportation Needs: Not on file  Physical Activity: Not on file  Stress: Not on file  Social Connections: Not on file    Family History  Problem Relation Age of Onset   Stroke Mother    High blood pressure Mother    High Cholesterol  Mother    Hypertension Mother    Stroke Father    High blood pressure Father    High Cholesterol Father    Cancer Father    Hypertension Father    Lung cancer Father    Heart attack Maternal Grandfather    Heart disease Maternal Grandfather    Stroke Maternal Grandfather    Early death Paternal Grandmother    Heart attack Paternal Grandmother    Heart disease Paternal Grandmother    Heart attack Paternal Grandfather    Heart disease Paternal Grandfather     Health Maintenance  Topic Date Due   Hepatitis C Screening  Never done   Zoster Vaccines- Shingrix (1 of 2) Never done   MAMMOGRAM  05/13/2019   COVID-19 Vaccine (3 - Moderna series) 01/17/2020   INFLUENZA VACCINE  05/13/2022   Pneumonia Vaccine 10+ Years old  Completed   DEXA SCAN  Completed   HPV VACCINES  Aged Out   COLONOSCOPY (Pts 45-38yrs Insurance coverage will need to be confirmed)  Discontinued   TETANUS/TDAP  Discontinued     ----------------------------------------------------------------------------------------------------------------------------------------------------------------------------------------------------------------- Physical Exam BP 119/77 (BP Location: Right Arm, Patient Position: Sitting, Cuff Size: Normal)   Pulse 66   Ht 5' 6.5" (1.689 m)   Wt 187 lb (84.8 kg)   SpO2 95%   BMI 29.73 kg/m   Physical Exam Constitutional:      Appearance: Normal appearance.  Eyes:     General: No scleral icterus. Musculoskeletal:     Cervical back: Neck supple.  Neurological:     Mental Status: She is alert.  Psychiatric:        Mood and Affect: Mood normal.        Behavior: Behavior normal.     ------------------------------------------------------------------------------------------------------------------------------------------------------------------------------------------------------------------- Assessment and Plan  Essential hypertension Blood pressure is well controlled at this  time.  Recommend continuation of current medications for management of hypertension.  Atrial fibrillation (HCC) Rate controlled at this time.  She will continue management per cardiology.  Remains anticoagulated with Eliquis  Hyperlipidemia Tolerating atorvastatin well at current strength.  We will plan to continue.  Abdominal discomfort in left lower quadrant We discussed possibility of diverticulitis if this is not improving.  She like to hold off on any additional work-up or imaging at this time.  Discussed getting CT scan if not improving.  Prediabetes A1c has improved from 6.4 to 6.1%.   No orders of the defined types were placed in this encounter.   Return in about 6 months (around 01/22/2023) for F/u HTN/Blood sugar.    This visit occurred during the SARS-CoV-2 public health emergency.  Safety protocols were in place, including screening questions prior to the visit, additional usage of staff PPE, and extensive cleaning of exam room while observing appropriate contact time as indicated for disinfecting solutions.

## 2022-07-23 NOTE — Assessment & Plan Note (Signed)
We discussed possibility of diverticulitis if this is not improving.  She like to hold off on any additional work-up or imaging at this time.  Discussed getting CT scan if not improving.

## 2022-07-23 NOTE — Assessment & Plan Note (Signed)
A1c has improved from 6.4 to 6.1%.

## 2022-07-26 ENCOUNTER — Encounter: Payer: Self-pay | Admitting: Family Medicine

## 2022-07-28 MED ORDER — ATORVASTATIN CALCIUM 10 MG PO TABS: 10.0000 mg | ORAL_TABLET | Freq: Every day | ORAL | 2 refills | Status: DC

## 2022-08-24 ENCOUNTER — Encounter: Payer: Self-pay | Admitting: Cardiology

## 2022-08-24 DIAGNOSIS — I10 Essential (primary) hypertension: Secondary | ICD-10-CM

## 2022-08-25 MED ORDER — HYDROCHLOROTHIAZIDE 12.5 MG PO TABS: 12.5000 mg | ORAL_TABLET | Freq: Every day | ORAL | 2 refills | Status: DC

## 2022-08-25 MED ORDER — LOSARTAN POTASSIUM 50 MG PO TABS: ORAL_TABLET | ORAL | 2 refills | Status: DC

## 2022-11-13 ENCOUNTER — Other Ambulatory Visit (HOSPITAL_COMMUNITY): Payer: Self-pay | Admitting: Interventional Radiology

## 2022-11-13 DIAGNOSIS — I671 Cerebral aneurysm, nonruptured: Secondary | ICD-10-CM

## 2022-11-15 ENCOUNTER — Encounter: Payer: Self-pay | Admitting: Cardiology

## 2022-11-17 ENCOUNTER — Telehealth: Payer: Self-pay

## 2022-11-17 MED ORDER — NEBIVOLOL HCL 5 MG PO TABS: 5.0000 mg | ORAL_TABLET | Freq: Two times a day (BID) | ORAL | 1 refills | Status: DC

## 2022-11-17 MED ORDER — AMLODIPINE BESYLATE 2.5 MG PO TABS: 2.5000 mg | ORAL_TABLET | Freq: Every day | ORAL | 1 refills | Status: DC

## 2022-11-17 NOTE — Telephone Encounter (Signed)
Patient medications refilled to mail order pharmacy. Patient made aware vis MyChart.

## 2022-11-17 NOTE — Telephone Encounter (Signed)
Patient sent Mychart needing Refill on on Norvasc 2.5mg  daily and Bystolic. Review of last OV August 2023 Norvac was stopped. Patient stated she restarted it after about a week as she noticed her systolic BP was increasing from 140-165 at bed time. She was also having increased palpitations and SOB. She started herself back on the medication at that time and is wanting to continue.  Currently she is feeling fine without palpitations or SOB,with systolic BP is 696-295 most days.The lower extremity edema went away when medication was stopped and has not returned. Informed patient her request to refill will be sent to MD and we will update her via MyChart once it is sent. Verified she would like it to go to the mail order pharmacy, and informed patient that I setup her annual appointment at [facility] location for August this year.  Patient agreed with plan and looking forward to response.

## 2022-11-24 ENCOUNTER — Ambulatory Visit (HOSPITAL_COMMUNITY)
Admission: RE | Admit: 2022-11-24 | Discharge: 2022-11-24 | Disposition: A | Discharge status: Home / Self Care | Payer: Medicare Other | Source: Ambulatory Visit | Attending: Interventional Radiology | Admitting: Interventional Radiology

## 2022-11-24 DIAGNOSIS — I671 Cerebral aneurysm, nonruptured: Secondary | ICD-10-CM | POA: Diagnosis present

## 2022-11-26 ENCOUNTER — Telehealth (HOSPITAL_COMMUNITY): Payer: Self-pay

## 2022-11-26 NOTE — Telephone Encounter (Signed)
Called pt regarding recent imaging, no answer, left vm. AB

## 2022-11-26 NOTE — Telephone Encounter (Signed)
Pt agreed to f/u in 1 year with an mra head wo. AB

## 2022-12-06 ENCOUNTER — Encounter: Payer: Self-pay | Admitting: Family Medicine

## 2022-12-08 MED ORDER — OMEPRAZOLE 40 MG PO CPDR: 40.0000 mg | DELAYED_RELEASE_CAPSULE | Freq: Every day | ORAL | 3 refills | Status: AC

## 2023-01-03 ENCOUNTER — Encounter: Payer: Self-pay | Admitting: Cardiology

## 2023-01-05 ENCOUNTER — Other Ambulatory Visit (HOSPITAL_COMMUNITY): Payer: Self-pay

## 2023-01-05 NOTE — Telephone Encounter (Signed)
  PER TEST CLAIM PLAN NOT REQUIRING PA AT THIS TIME. LAST FILLED 11/10/22 INSURANCE WILL PAY AGAIN ON 01/16/23.

## 2023-01-23 ENCOUNTER — Encounter: Payer: Self-pay | Admitting: Family Medicine

## 2023-01-23 ENCOUNTER — Ambulatory Visit (INDEPENDENT_AMBULATORY_CARE_PROVIDER_SITE_OTHER): Payer: Medicare Other | Admitting: Family Medicine

## 2023-01-23 VITALS — BP 119/70 | HR 67 | Ht 66.5 in | Wt 188.0 lb

## 2023-01-23 DIAGNOSIS — R7303 Prediabetes: Secondary | ICD-10-CM

## 2023-01-23 DIAGNOSIS — E538 Deficiency of other specified B group vitamins: Secondary | ICD-10-CM | POA: Diagnosis not present

## 2023-01-23 DIAGNOSIS — I10 Essential (primary) hypertension: Secondary | ICD-10-CM

## 2023-01-23 DIAGNOSIS — I4821 Permanent atrial fibrillation: Secondary | ICD-10-CM

## 2023-01-23 DIAGNOSIS — E7800 Pure hypercholesterolemia, unspecified: Secondary | ICD-10-CM | POA: Diagnosis not present

## 2023-01-23 NOTE — Assessment & Plan Note (Signed)
Stable at this time.  Continue with cardiology.  Continue current medications including eliquis for anticoagulation.  Updated labs ordered.

## 2023-01-23 NOTE — Assessment & Plan Note (Signed)
Update b12 levels.  

## 2023-01-23 NOTE — Progress Notes (Signed)
Allison Richardson - 77 y.o. female MRN [id number]  Date of birth: Sep 17, 1946  Subjective Chief Complaint  Patient presents with   Hypertension   Diabetes    HPI Allison Richardson is a 77 y.o. female here today for follow up.   She reports that she is doing quite well.  She denies new concerns at this time.  Continues to see cardiology regularly.  Denies new chest pain, increased palpitations or dyspnea.   She is taking medications as directed and tolerating well.  BP remains well controlled.  She is due for updated labs.  She knows that she is due for mammogram but doesn't want to have this at this time.   ROS:  A comprehensive ROS was completed and negative except as noted per HPI  Allergies  Allergen Reactions   Adhesive [Tape] Other (See Comments)    Heart monitor leads- CREATED RED WELTS!!   Other Other (See Comments)    Has had adverse reaction to a migraine medication Heart monitor leads- CREATED RED WELTS!!   Penicillins Itching    Did it involve swelling of the face/tongue/throat, SOB, or low BP? Unk Did it involve sudden or severe rash/hives, skin peeling, or any reaction on the inside of your mouth or nose? Unk Did you need to seek medical attention at a hospital or doctor's office? No When did it last happen? Childhood If all above answers are "NO", may proceed with cephalosporin use.    Simvastatin Itching   Sumatriptan Succinate Other (See Comments)    Hypotension     Past Medical History:  Diagnosis Date   GERD (gastroesophageal reflux disease)    Hypercholesteremia    Hyperlipidemia 12/05/2017   Hypertension    Hypothyroid    Lyme disease    Osteopenia after menopause 05/14/2018   DEXA 04/2018, [facility]: Impression- Osteopenia   T Scores  R Femur Neck- -0.70 L Femur Neck- -1.20 R Total Femur- -0.40 L Total Femur- -1.50 AP Total Spine: -0.30    SVT (supraventricular tachycardia)    TIA (transient ischemic attack) 12/05/2017   TIA (transient ischemic attack)     Past  Surgical History:  Procedure Laterality Date   ABDOMINAL HYSTERECTOMY     APPENDECTOMY     BUNIONECTOMY Left    CARDIOVERSION N/A 03/08/2020   Procedure: CARDIOVERSION;  Surgeon: Lewayne Bunting, MD;  Location: [facility] ENDOSCOPY;  Service: Cardiovascular;  Laterality: N/A;   IR RADIOLOGIST EVAL & MGMT  01/05/2018   TEE WITHOUT CARDIOVERSION N/A 03/08/2020   Procedure: TRANSESOPHAGEAL ECHOCARDIOGRAM (TEE);  Surgeon: Lewayne Bunting, MD;  Location: [facility] ENDOSCOPY;  Service: Cardiovascular;  Laterality: N/A;    Social History   Socioeconomic History   Marital status: Married    Spouse name: Not on file   Number of children: 0   Years of education: 18-20   Highest education level: Not on file  Occupational History   Occupation: Retired  Tobacco Use   Smoking status: Never   Smokeless tobacco: Never  Vaping Use   Vaping Use: Never used  Substance and Sexual Activity   Alcohol use: No   Drug use: No   Sexual activity: Yes  Other Topics Concern   Not on file  Social History Narrative   Lives with husband   Caffeine use: Coffee daily   Right handed   Social Determinants of Health   Financial Resource Strain: Not on file  Food Insecurity: Not on file  Transportation Needs: Not on file  Physical Activity: Not  on file  Stress: Not on file  Social Connections: Not on file    Family History  Problem Relation Age of Onset   Stroke Mother    High blood pressure Mother    High Cholesterol Mother    Hypertension Mother    Stroke Father    High blood pressure Father    High Cholesterol Father    Cancer Father    Hypertension Father    Lung cancer Father    Heart attack Maternal Grandfather    Heart disease Maternal Grandfather    Stroke Maternal Grandfather    Early death Paternal Grandmother    Heart attack Paternal Grandmother    Heart disease Paternal Grandmother    Heart attack Paternal Grandfather    Heart disease Paternal Grandfather     Health Maintenance  Topic  Date Due   DTaP/Tdap/Td (2 - Tdap) 12/08/2017   MAMMOGRAM  01/23/2024 (Originally 05/13/2019)   Hepatitis C Screening  01/23/2024 (Originally 12/20/1963)   COVID-19 Vaccine (3 - 2023-24 season) 02/08/2024 (Originally 06/13/2022)   Medicare Annual Wellness (AWV)  02/22/2024 (Originally 03/19/2021)   Zoster Vaccines- Shingrix (1 of 2) 04/23/2024 (Originally 12/20/1995)   INFLUENZA VACCINE  05/14/2023   Pneumonia Vaccine 49+ Years old  Completed   DEXA SCAN  Completed   HPV VACCINES  Aged Out   COLONOSCOPY (Pts 45-14yrs Insurance coverage will need to be confirmed)  Discontinued     ----------------------------------------------------------------------------------------------------------------------------------------------------------------------------------------------------------------- Physical Exam BP 119/70 (BP Location: Left Arm, Patient Position: Sitting, Cuff Size: Normal)   Pulse 67   Ht 5' 6.5" (1.689 m)   Wt 188 lb (85.3 kg)   SpO2 96%   BMI 29.89 kg/m   Physical Exam Constitutional:      Appearance: Normal appearance.  HENT:     Head: Normocephalic and atraumatic.  Eyes:     General: No scleral icterus. Cardiovascular:     Rate and Rhythm: Normal rate and regular rhythm.  Pulmonary:     Effort: Pulmonary effort is normal.     Breath sounds: Normal breath sounds.  Musculoskeletal:     Cervical back: Neck supple.  Neurological:     General: No focal deficit present.     Mental Status: She is alert.  Psychiatric:        Mood and Affect: Mood normal.        Behavior: Behavior normal.     ------------------------------------------------------------------------------------------------------------------------------------------------------------------------------------------------------------------- Assessment and Plan  Atrial fibrillation (HCC) Stable at this time.  Continue with cardiology.  Continue current medications including eliquis for anticoagulation.  Updated  labs ordered.   Essential hypertension BP is well controlled with current medications. No changes at this time.   Hyperlipidemia Doing well with atorvastatin.  Updated lipid panel ordered.   Prediabetes Update A1c.   Vitamin B 12 deficiency Update b12 levels.    No orders of the defined types were placed in this encounter.   Return in about 1 year (around 01/23/2024) for f/u HTN/HLD.    This visit occurred during the SARS-CoV-2 public health emergency.  Safety protocols were in place, including screening questions prior to the visit, additional usage of staff PPE, and extensive cleaning of exam room while observing appropriate contact time as indicated for disinfecting solutions.

## 2023-01-23 NOTE — Assessment & Plan Note (Signed)
Update A1c ?

## 2023-01-23 NOTE — Assessment & Plan Note (Signed)
BP is well controlled with current medications. No changes at this time.

## 2023-01-23 NOTE — Assessment & Plan Note (Signed)
Doing well with atorvastatin.  Updated lipid panel ordered.

## 2023-01-27 ENCOUNTER — Other Ambulatory Visit: Payer: Self-pay | Admitting: Family Medicine

## 2023-01-30 LAB — CBC WITH DIFFERENTIAL/PLATELET
Absolute Monocytes: 399 cells/uL (ref 200–950)
Eosinophils Absolute: 42 cells/uL (ref 15–500)
Lymphs Abs: 1631 cells/uL (ref 850–3900)
MCV: 89.3 fL (ref 80.0–100.0)
Monocytes Relative: 5.7 %
Neutro Abs: 4851 cells/uL (ref 1500–7800)
Platelets: 272 10*3/uL (ref 140–400)
WBC: 7 10*3/uL (ref 3.8–10.8)

## 2023-01-31 LAB — HEMOGLOBIN A1C
Hgb A1c MFr Bld: 6.6 % of total Hgb — ABNORMAL HIGH (ref <5.7)
Mean Plasma Glucose: 143 mg/dL
eAG (mmol/L): 7.9 mmol/L

## 2023-01-31 LAB — COMPLETE METABOLIC PANEL WITH GFR
AG Ratio: 2 (calc) (ref 1.0–2.5)
ALT: 18 U/L (ref 6–29)
AST: 15 U/L (ref 10–35)
Albumin: 4.3 g/dL (ref 3.6–5.1)
Alkaline phosphatase (APISO): 79 U/L (ref 37–153)
BUN: 17 mg/dL (ref 7–25)
CO2: 27 mmol/L (ref 20–32)
Calcium: 9.3 mg/dL (ref 8.6–10.4)
Chloride: 102 mmol/L (ref 98–110)
Creat: 0.84 mg/dL (ref 0.60–1.00)
Globulin: 2.1 g/dL (calc) (ref 1.9–3.7)
Glucose, Bld: 106 mg/dL — ABNORMAL HIGH (ref 65–99)
Potassium: 4.3 mmol/L (ref 3.5–5.3)
Sodium: 140 mmol/L (ref 135–146)
Total Bilirubin: 0.6 mg/dL (ref 0.2–1.2)
Total Protein: 6.4 g/dL (ref 6.1–8.1)
eGFR: 72 mL/min/{1.73_m2} (ref >=60)

## 2023-01-31 LAB — CBC WITH DIFFERENTIAL/PLATELET
Basophils Absolute: 77 cells/uL (ref 0–200)
Basophils Relative: 1.1 %
Eosinophils Relative: 0.6 %
HCT: 39.2 % (ref 35.0–45.0)
Hemoglobin: 12.9 g/dL (ref 11.7–15.5)
MCH: 29.4 pg (ref 27.0–33.0)
MCHC: 32.9 g/dL (ref 32.0–36.0)
MPV: 10.9 fL (ref 7.5–12.5)
Neutrophils Relative %: 69.3 %
RBC: 4.39 10*6/uL (ref 3.80–5.10)
RDW: 12.6 % (ref 11.0–15.0)
Total Lymphocyte: 23.3 %

## 2023-01-31 LAB — LIPID PANEL W/REFLEX DIRECT LDL
Cholesterol: 143 mg/dL (ref <200)
HDL: 46 mg/dL — ABNORMAL LOW (ref >=50)
LDL Cholesterol (Calc): 78 mg/dL (calc)
Non-HDL Cholesterol (Calc): 97 mg/dL (calc) (ref <130)
Total CHOL/HDL Ratio: 3.1 (calc) (ref <5.0)
Triglycerides: 104 mg/dL (ref <150)

## 2023-01-31 LAB — TSH: TSH: 2.76 mIU/L (ref 0.40–4.50)

## 2023-01-31 LAB — VITAMIN B12: Vitamin B-12: 264 pg/mL (ref 200–1100)

## 2023-02-05 ENCOUNTER — Other Ambulatory Visit: Payer: Self-pay | Admitting: *Deleted

## 2023-02-05 ENCOUNTER — Encounter: Payer: Self-pay | Admitting: Cardiology

## 2023-02-05 ENCOUNTER — Other Ambulatory Visit: Payer: Self-pay | Admitting: Cardiology

## 2023-02-05 DIAGNOSIS — I4819 Other persistent atrial fibrillation: Secondary | ICD-10-CM

## 2023-02-05 NOTE — Telephone Encounter (Signed)
Prescription refill request for Eliquis received.  Indication: afib  Last office visit: [place], 05/28/2022 Scr: 0.84, 01/30/2023 Age: 77 yo  Weight: 85.3 kg   Refill sent.

## 2023-02-17 ENCOUNTER — Ambulatory Visit (INDEPENDENT_AMBULATORY_CARE_PROVIDER_SITE_OTHER): Payer: Medicare Other | Admitting: Family Medicine

## 2023-02-17 ENCOUNTER — Encounter: Payer: Self-pay | Admitting: Family Medicine

## 2023-02-17 ENCOUNTER — Ambulatory Visit (INDEPENDENT_AMBULATORY_CARE_PROVIDER_SITE_OTHER): Payer: Medicare Other

## 2023-02-17 VITALS — BP 136/82 | HR 69 | Ht 66.5 in | Wt 189.0 lb

## 2023-02-17 DIAGNOSIS — M25561 Pain in right knee: Secondary | ICD-10-CM | POA: Diagnosis not present

## 2023-02-17 DIAGNOSIS — S86911A Strain of unspecified muscle(s) and tendon(s) at lower leg level, right leg, initial encounter: Secondary | ICD-10-CM | POA: Diagnosis not present

## 2023-02-17 DIAGNOSIS — M1711 Unilateral primary osteoarthritis, right knee: Secondary | ICD-10-CM | POA: Insufficient documentation

## 2023-02-17 DIAGNOSIS — S86919A Strain of unspecified muscle(s) and tendon(s) at lower leg level, unspecified leg, initial encounter: Secondary | ICD-10-CM | POA: Insufficient documentation

## 2023-02-17 NOTE — Progress Notes (Signed)
Allison Richardson - 77 y.o. female MRN [id number]  Date of birth: June 07, 1946  Subjective Chief Complaint  Patient presents with   Knee Pain    HPI Allison Richardson is a 77 y.o. female here today with complaint of R knee pain.  This started a couple of weeks ago.  She reports that initially she rolled over in bed and felt her kneecap slide out of place.  She didn't really have any pain with this and it had happened.  Has noticed pain on the lateral and posterior leg.  She does feel sensation of tightness in the back of the knee.  Denies numbness or tingling.   ROS:  A comprehensive ROS was completed and negative except as noted per HPI  Allergies  Allergen Reactions   Adhesive [Tape] Other (See Comments)    Heart monitor leads- CREATED RED WELTS!!   Other Other (See Comments)    Has had adverse reaction to a migraine medication Heart monitor leads- CREATED RED WELTS!!   Penicillins Itching    Did it involve swelling of the face/tongue/throat, SOB, or low BP? Unk Did it involve sudden or severe rash/hives, skin peeling, or any reaction on the inside of your mouth or nose? Unk Did you need to seek medical attention at a hospital or doctor's office? No When did it last happen? Childhood If all above answers are "NO", may proceed with cephalosporin use.    Simvastatin Itching   Sumatriptan Succinate Other (See Comments)    Hypotension     Past Medical History:  Diagnosis Date   GERD (gastroesophageal reflux disease)    Hypercholesteremia    Hyperlipidemia 12/05/2017   Hypertension    Hypothyroid    Lyme disease    Osteopenia after menopause 05/14/2018   DEXA 04/2018, [facility]: Impression- Osteopenia   T Scores  R Femur Neck- -0.70 L Femur Neck- -1.20 R Total Femur- -0.40 L Total Femur- -1.50 AP Total Spine: -0.30    SVT (supraventricular tachycardia)    TIA (transient ischemic attack) 12/05/2017   TIA (transient ischemic attack)     Past Surgical History:  Procedure Laterality Date    ABDOMINAL HYSTERECTOMY     APPENDECTOMY     BUNIONECTOMY Left    CARDIOVERSION N/A 03/08/2020   Procedure: CARDIOVERSION;  Surgeon: Lewayne Bunting, MD;  Location: [facility] ENDOSCOPY;  Service: Cardiovascular;  Laterality: N/A;   IR RADIOLOGIST EVAL & MGMT  01/05/2018   TEE WITHOUT CARDIOVERSION N/A 03/08/2020   Procedure: TRANSESOPHAGEAL ECHOCARDIOGRAM (TEE);  Surgeon: Lewayne Bunting, MD;  Location: [facility] ENDOSCOPY;  Service: Cardiovascular;  Laterality: N/A;    Social History   Socioeconomic History   Marital status: Married    Spouse name: Not on file   Number of children: 0   Years of education: 18-20   Highest education level: Not on file  Occupational History   Occupation: Retired  Tobacco Use   Smoking status: Never   Smokeless tobacco: Never  Vaping Use   Vaping Use: Never used  Substance and Sexual Activity   Alcohol use: No   Drug use: No   Sexual activity: Yes  Other Topics Concern   Not on file  Social History Narrative   Lives with husband   Caffeine use: Coffee daily   Right handed   Social Determinants of Health   Financial Resource Strain: Not on file  Food Insecurity: Not on file  Transportation Needs: Not on file  Physical Activity: Not on file  Stress: Not on  file  Social Connections: Not on file    Family History  Problem Relation Age of Onset   Stroke Mother    High blood pressure Mother    High Cholesterol Mother    Hypertension Mother    Stroke Father    High blood pressure Father    High Cholesterol Father    Cancer Father    Hypertension Father    Lung cancer Father    Heart attack Maternal Grandfather    Heart disease Maternal Grandfather    Stroke Maternal Grandfather    Early death Paternal Grandmother    Heart attack Paternal Grandmother    Heart disease Paternal Grandmother    Heart attack Paternal Grandfather    Heart disease Paternal Grandfather     Health Maintenance  Topic Date Due   DTaP/Tdap/Td (2 - Tdap) 12/08/2017    MAMMOGRAM  01/23/2024 (Originally 05/13/2019)   Hepatitis C Screening  01/23/2024 (Originally 12/20/1963)   COVID-19 Vaccine (3 - 2023-24 season) 02/08/2024 (Originally 06/13/2022)   Medicare Annual Wellness (AWV)  02/22/2024 (Originally 03/19/2021)   Zoster Vaccines- Shingrix (1 of 2) 04/23/2024 (Originally 12/20/1995)   INFLUENZA VACCINE  05/14/2023   Pneumonia Vaccine 57+ Years old  Completed   DEXA SCAN  Completed   HPV VACCINES  Aged Out   COLONOSCOPY (Pts 45-55yrs Insurance coverage will need to be confirmed)  Discontinued     ----------------------------------------------------------------------------------------------------------------------------------------------------------------------------------------------------------------- Physical Exam BP 136/82 (BP Location: Left Arm, Patient Position: Sitting, Cuff Size: Normal)   Pulse 69   Ht 5' 6.5" (1.689 m)   Wt 189 lb (85.7 kg)   SpO2 96%   BMI 30.05 kg/m   Physical Exam Constitutional:      Appearance: Normal appearance.  HENT:     Head: Normocephalic and atraumatic.  Eyes:     General: No scleral icterus. Musculoskeletal:     Comments: R knee normal to inspection.  No effusion noted.  Tenderness palpation along the lateral posterior lateral knee.  No pain or laxity with valgus and varus stress.  Negative Lachman.  Meniscal provocation testing was only with mild pain and no clicking or catching.  Neurological:     Mental Status: She is alert.  Psychiatric:        Mood and Affect: Mood normal.        Behavior: Behavior normal.     ------------------------------------------------------------------------------------------------------------------------------------------------------------------------------------------------------------------- Assessment and Plan  Knee strain She is already formal physical therapy and I gave her home exercise plan to try initially.  X-rays ordered.  She is on eliquis we did discuss that she can  try topical Voltaren gel.   No orders of the defined types were placed in this encounter.   No follow-ups on file.    This visit occurred during the SARS-CoV-2 public health emergency.  Safety protocols were in place, including screening questions prior to the visit, additional usage of staff PPE, and extensive cleaning of exam room while observing appropriate contact time as indicated for disinfecting solutions.

## 2023-02-17 NOTE — Assessment & Plan Note (Signed)
She is already formal physical therapy and I gave her home exercise plan to try initially.  X-rays ordered.  She is on eliquis we did discuss that she can try topical Voltaren gel.

## 2023-02-17 NOTE — Patient Instructions (Addendum)
Try voltaren gel.  Try home exercises Let me know if worsening or if not improving after 3-4 weeks.

## 2023-02-24 ENCOUNTER — Encounter: Payer: Self-pay | Admitting: Family Medicine

## 2023-03-03 ENCOUNTER — Encounter: Payer: Self-pay | Admitting: Family Medicine

## 2023-03-10 ENCOUNTER — Ambulatory Visit (INDEPENDENT_AMBULATORY_CARE_PROVIDER_SITE_OTHER): Payer: Medicare Other | Admitting: Sports Medicine

## 2023-03-10 DIAGNOSIS — M1711 Unilateral primary osteoarthritis, right knee: Secondary | ICD-10-CM | POA: Diagnosis not present

## 2023-03-10 NOTE — Progress Notes (Signed)
    Procedures performed today:    None.  Independent interpretation of notes and tests performed by another provider:   None.  Brief History, Exam, Impression, and Recommendations:    Primary osteoarthritis of right knee This is a very pleasant 77 year old female, she has a 1 to 56-month history of pain right knee after rolling over in bed, since then she has had increasing gelling, stiffness. She did work with an Ship broker and has noted good improvement. She also tried some Tylenol which has helped significantly. On Eliquis so avoiding oral NSAIDs. 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    ____________________________________________ Ihor Austin. Benjamin Stain, M.D., ABFM., CAQSM., AME. Primary Care and Sports Medicine [facility]  Adjunct Professor of Family Medicine  [place] [employer] [facility] [employer]  Restaurant manager, fast food

## 2023-03-10 NOTE — Assessment & Plan Note (Signed)
This is a very pleasant 77 year old female, she has a 1 to 24-month history of pain right knee after rolling over in bed, since then she has had increasing gelling, stiffness. She did work with an Ship broker and has noted good improvement. She also tried some Tylenol which has helped significantly. On Eliquis so avoiding oral NSAIDs. 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

## 2023-04-03 ENCOUNTER — Encounter: Payer: Self-pay | Admitting: Family Medicine

## 2023-04-19 ENCOUNTER — Encounter: Payer: Self-pay | Admitting: Family Medicine

## 2023-05-23 ENCOUNTER — Encounter: Payer: Self-pay | Admitting: Cardiology

## 2023-05-23 DIAGNOSIS — I10 Essential (primary) hypertension: Secondary | ICD-10-CM

## 2023-05-25 MED ORDER — LOSARTAN POTASSIUM 50 MG PO TABS
ORAL_TABLET | ORAL | 0 refills | Status: DC
Start: 2023-05-25 — End: 2023-08-24

## 2023-05-25 MED ORDER — HYDROCHLOROTHIAZIDE 12.5 MG PO TABS: 12.5000 mg | ORAL_TABLET | Freq: Every day | ORAL | 0 refills | Status: DC

## 2023-05-26 NOTE — Progress Notes (Signed)
HPI: FU atrial fibrillation and hypertension. Also h/o TIA.  Renal Dopplers December 2020 showed no renal artery stenosis. Echocardiogram April 2021 showed normal LV function, mild left ventricular hypertrophy, biatrial enlargement, mild mitral regurgitation and mild tricuspid regurgitation.  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 TEE May 2021 showed normal LV function, mild mitral regurgitation, moderate left atrial enlargement, no left atrial appendage thrombus and mild tricuspid regurgitation. Patient subsequently underwent cardioversion successfully to sinus bradycardia.  FU Monitor April 2022 showed atrial fibrillation rate controlled.  Now treated with rate control.  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  Since last seen, she denies dyspnea, chest pain, palpitations, syncope or bleeding.  Occasional minimal pedal edema.  She states her blood pressure is in the 150s at times in the evening.  Current Outpatient Medications  Medication Sig Dispense Refill   acetaminophen (TYLENOL) 500 MG tablet Take 500 mg by mouth every 6 (six) hours as needed (for pain.).     amLODipine (NORVASC) 2.5 MG tablet Take 1 tablet (2.5 mg total) by mouth daily. 90 tablet 1   apixaban (ELIQUIS) 5 MG TABS tablet TAKE ONE TABLET BY MOUTH TWICE DAILY 180 tablet 1   atorvastatin (LIPITOR) 10 MG tablet TAKE ONE TABLET BY MOUTH AT BEDTIME 90 tablet 3   Cholecalciferol (VITAMIN D-3) 25 MCG (1000 UT) CAPS Take 1,000 Units by mouth daily.     hydrochlorothiazide (HYDRODIURIL) 12.5 MG tablet Take 1 tablet (12.5 mg total) by mouth daily. 90 tablet 0   losartan (COZAAR) 50 MG tablet TAKE ONE (1) TABLET BY MOUTH TWO (2) TIMES DAILY 180 tablet 0   nebivolol (BYSTOLIC) 5 MG tablet Take 1 tablet (5 mg total) by mouth 2 (two) times daily. 180 tablet 1    omeprazole (PRILOSEC) 40 MG capsule Take 1 capsule (40 mg total) by mouth daily. 90 capsule 3   Probiotic Product (DIGESTIVE ADVANTAGE PO) Take 1 capsule by mouth daily.     vitamin C (ASCORBIC ACID) 500 MG tablet Take 500 mg by mouth daily.     Wheat Dextrin (BENEFIBER DRINK MIX PO) Take 4.2 g by mouth daily. Mix 1 teaspoonful of powder into 6-8 ounces of coffee and drink in the morning (once a day)     No current facility-administered medications for this visit.     Past Medical History:  Diagnosis Date   GERD (gastroesophageal reflux disease)    Hypercholesteremia    Hyperlipidemia 12/05/2017   Hypertension    Hypothyroid    Lyme disease    Osteopenia after menopause 05/14/2018   DEXA 04/2018, [facility]: Impression- Osteopenia   T Scores  R Femur Neck- -0.70 L Femur Neck- -1.20 R Total Femur- -0.40 L Total Femur- -1.50 AP Total Spine: -0.30    SVT (supraventricular tachycardia)    TIA (transient ischemic attack) 12/05/2017   TIA (transient ischemic attack)     Past Surgical History:  Procedure Laterality Date   ABDOMINAL HYSTERECTOMY     APPENDECTOMY     BUNIONECTOMY Left    CARDIOVERSION N/A 03/08/2020   Procedure: CARDIOVERSION;  Surgeon: Lewayne Bunting, MD;  Location: [facility] ENDOSCOPY;  Service: Cardiovascular;  Laterality: N/A;   IR RADIOLOGIST EVAL & MGMT  01/05/2018   TEE WITHOUT CARDIOVERSION N/A 03/08/2020   Procedure: TRANSESOPHAGEAL ECHOCARDIOGRAM (TEE);  Surgeon: Lewayne Bunting, MD;  Location: [facility] ENDOSCOPY;  Service: Cardiovascular;  Laterality: N/A;    Social History   Socioeconomic History   Marital status: Married    Spouse name: Not on file   Number of children: 0   Years of education: 18-20   Highest education level: Not on file  Occupational History   Occupation: Retired  Tobacco Use   Smoking status: Never   Smokeless tobacco: Never  Vaping Use   Vaping status: Never Used  Substance and Sexual Activity   Alcohol use: No   Drug use: No   Sexual  activity: Yes  Other Topics Concern   Not on file  Social History Narrative   Lives with husband   Caffeine use: Coffee daily   Right handed   Social Determinants of Health   Financial Resource Strain: Not on file  Food Insecurity: Not on file  Transportation Needs: Not on file  Physical Activity: Not on file  Stress: Not on file  Social Connections: Not on file  Intimate Partner Violence: Not on file    Family History  Problem Relation Age of Onset   Stroke Mother    High blood pressure Mother    High Cholesterol Mother    Hypertension Mother    Stroke Father    High blood pressure Father    High Cholesterol Father    Cancer Father    Hypertension Father    Lung cancer Father    Heart attack Maternal Grandfather    Heart disease Maternal Grandfather    Stroke Maternal Grandfather    Early death Paternal Grandmother    Heart attack Paternal Grandmother    Heart disease Paternal Grandmother    Heart attack Paternal Grandfather    Heart disease Paternal Grandfather     ROS: no fevers or chills, productive cough, hemoptysis, dysphasia, odynophagia, melena, hematochezia, dysuria, hematuria, rash, seizure activity, orthopnea, PND, claudication. Remaining systems are negative.  Physical Exam: Well-developed well-nourished in no acute distress.  Skin is warm and dry.  HEENT is normal.  Neck is supple.  Chest is clear to auscultation with normal expansion.  Cardiovascular exam is irregular Abdominal exam nontender or distended. No masses palpated. Extremities show no edema. neuro grossly intact  EKG Interpretation Date/Time:  Wednesday June 03 2023 09:00:02 EDT Ventricular Rate:  61 PR Interval:    QRS Duration:  74 QT Interval:  402 QTC Calculation: 404 R Axis:   93  Text Interpretation: Atrial fibrillation Rightward axis Low voltage QRS When compared with ECG of 08-Mar-2020 09:44, Atrial fibrillation has replaced Sinus rhythm Questionable change in QRS axis  Confirmed by Olga Millers ([id number]) on 06/03/2023 9:07:19 AM    A/P  1 permanent atrial fibrillation-continue beta-blocker for rate control.  Continue apixaban.  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  3 hypertension-blood pressure controlled this morning but she states it is elevated at home in the evenings.  I will increase her amlodipine to 5 mg daily and she will take this prior to dinner.  Follow blood pressure and adjust as needed.  4 hyperlipidemia-continue statin.  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  Most recent MRA showed no change in size.  Olga Millers, MD

## 2023-06-03 ENCOUNTER — Ambulatory Visit: Payer: Medicare Other | Attending: Cardiology | Admitting: Cardiology

## 2023-06-03 ENCOUNTER — Encounter: Payer: Self-pay | Admitting: Cardiology

## 2023-06-03 VITALS — BP 122/72 | HR 61 | Ht 66.5 in | Wt 188.8 lb

## 2023-06-03 DIAGNOSIS — I4819 Other persistent atrial fibrillation: Secondary | ICD-10-CM | POA: Insufficient documentation

## 2023-06-03 DIAGNOSIS — I10 Essential (primary) hypertension: Secondary | ICD-10-CM | POA: Diagnosis not present

## 2023-06-03 DIAGNOSIS — E7800 Pure hypercholesterolemia, unspecified: Secondary | ICD-10-CM | POA: Diagnosis not present

## 2023-06-03 MED ORDER — AMLODIPINE BESYLATE 5 MG PO TABS: 5.0000 mg | ORAL_TABLET | Freq: Every day | ORAL | Status: DC

## 2023-06-03 NOTE — Patient Instructions (Signed)
Medication Instructions:   INCREASE AMLODIPINE TO 5 MG ONCE DAILY  *If you need a refill on your cardiac medications before your next appointment, please call your pharmacy*   Follow-Up: At [facility], you and your health needs are our priority.  As part of our continuing mission to provide you with exceptional heart care, we have created designated Provider Care Teams.  These Care Teams include your primary Cardiologist (physician) and Advanced Practice Providers (APPs -  Physician Assistants and Nurse Practitioners) who all work together to provide you with the care you need, when you need it.  We recommend signing up for the patient portal called "MyChart".  Sign up information is provided on this After Visit Summary.  MyChart is used to connect with patients for Virtual Visits (Telemedicine).  Patients are able to view lab/test results, encounter notes, upcoming appointments, etc.  Non-urgent messages can be sent to your provider as well.   To learn more about what you can do with MyChart, go to [email or web address].    Your next appointment:   12 month(s)  Provider:   Olga Millers, MD

## 2023-06-30 ENCOUNTER — Other Ambulatory Visit: Payer: Self-pay | Admitting: Cardiology

## 2023-07-02 ENCOUNTER — Encounter: Payer: Self-pay | Admitting: Cardiology

## 2023-07-02 MED ORDER — AMLODIPINE BESYLATE 5 MG PO TABS: 5.0000 mg | ORAL_TABLET | Freq: Every day | ORAL | Status: DC

## 2023-07-02 MED ORDER — AMLODIPINE BESYLATE 2.5 MG PO TABS: 2.5000 mg | ORAL_TABLET | Freq: Every day | ORAL | 3 refills | Status: DC

## 2023-07-02 NOTE — Addendum Note (Signed)
Addended by: Candie Chroman on: 07/02/2023 04:06 PM   Modules accepted: Orders

## 2023-08-01 ENCOUNTER — Encounter: Payer: Self-pay | Admitting: Cardiology

## 2023-08-03 ENCOUNTER — Other Ambulatory Visit: Payer: Self-pay

## 2023-08-03 DIAGNOSIS — I4819 Other persistent atrial fibrillation: Secondary | ICD-10-CM

## 2023-08-03 MED ORDER — APIXABAN 5 MG PO TABS
5.0000 mg | ORAL_TABLET | Freq: Two times a day (BID) | ORAL | 1 refills | Status: DC
Start: 2023-08-03 — End: 2024-02-01

## 2023-08-03 NOTE — Telephone Encounter (Signed)
Prescription refill request for Eliquis received. Indication:afib Last office visit:8/24 Scr:0.84  4/24 Age: 77 Weight:85.6  kg  Prescription refilled

## 2023-08-22 ENCOUNTER — Encounter: Payer: Self-pay | Admitting: Cardiology

## 2023-08-24 ENCOUNTER — Other Ambulatory Visit: Payer: Self-pay

## 2023-08-24 DIAGNOSIS — I10 Essential (primary) hypertension: Secondary | ICD-10-CM

## 2023-08-24 MED ORDER — LOSARTAN POTASSIUM 50 MG PO TABS: ORAL_TABLET | ORAL | 2 refills | Status: DC

## 2023-08-24 MED ORDER — HYDROCHLOROTHIAZIDE 12.5 MG PO TABS: 12.5000 mg | ORAL_TABLET | Freq: Every day | ORAL | 2 refills | Status: DC

## 2023-09-11 ENCOUNTER — Encounter: Payer: Self-pay | Admitting: Cardiology

## 2023-09-17 ENCOUNTER — Ambulatory Visit: Payer: Medicare Other | Attending: Nurse Practitioner | Admitting: Nurse Practitioner

## 2023-09-17 ENCOUNTER — Encounter: Payer: Self-pay | Admitting: Nurse Practitioner

## 2023-09-17 VITALS — BP 116/70 | HR 70 | Ht 67.0 in | Wt 188.6 lb

## 2023-09-17 DIAGNOSIS — Z8673 Personal history of transient ischemic attack (TIA), and cerebral infarction without residual deficits: Secondary | ICD-10-CM

## 2023-09-17 DIAGNOSIS — I671 Cerebral aneurysm, nonruptured: Secondary | ICD-10-CM

## 2023-09-17 DIAGNOSIS — I10 Essential (primary) hypertension: Secondary | ICD-10-CM

## 2023-09-17 DIAGNOSIS — R0789 Other chest pain: Secondary | ICD-10-CM

## 2023-09-17 DIAGNOSIS — E785 Hyperlipidemia, unspecified: Secondary | ICD-10-CM

## 2023-09-17 DIAGNOSIS — I4821 Permanent atrial fibrillation: Secondary | ICD-10-CM | POA: Diagnosis not present

## 2023-09-17 NOTE — Patient Instructions (Signed)
Medication Instructions:  Your physician recommends that you continue on your current medications as directed. Please refer to the Current Medication list given to you today.   *If you need a refill on your cardiac medications before your next appointment, please call your pharmacy*   Lab Work: NONE ordered at this time of appointment   Testing/Procedures: NONE ordered at this time of appointment   Follow-Up: At [facility], you and your health needs are our priority.  As part of our continuing mission to provide you with exceptional heart care, we have created designated Provider Care Teams.  These Care Teams include your primary Cardiologist (physician) and Advanced Practice Providers (APPs -  Physician Assistants and Nurse Practitioners) who all work together to provide you with the care you need, when you need it.  We recommend signing up for the patient portal called "MyChart".  Sign up information is provided on this After Visit Summary.  MyChart is used to connect with patients for Virtual Visits (Telemedicine).  Patients are able to view lab/test results, encounter notes, upcoming appointments, etc.  Non-urgent messages can be sent to your provider as well.   To learn more about what you can do with MyChart, go to [email or web address].    Your next appointment:    05/2024  Provider:   Olga Millers, MD     Other Instructions

## 2023-09-17 NOTE — Progress Notes (Addendum)
Office Visit    Patient Name: Allison Richardson Date of Encounter: 09/17/2023  Primary Care Provider:  Everrett Coombe, DO Primary Cardiologist:  Olga Millers, MD  Chief Complaint    77 year old female with a history of permanent atrial fibrillation, hypertension, hyperlipidemia, TIA, splenic infarct, cerebral aneurysm, hypothyroidism, and GERD who presents for follow-up related to atrial fibrillation.  Past Medical History    Past Medical History:  Diagnosis Date   GERD (gastroesophageal reflux disease)    Hypercholesteremia    Hyperlipidemia 12/05/2017   Hypertension    Hypothyroid    Lyme disease    Osteopenia after menopause 05/14/2018   DEXA 04/2018, [facility]: Impression- Osteopenia   T Scores  R Femur Neck- -0.70 L Femur Neck- -1.20 R Total Femur- -0.40 L Total Femur- -1.50 AP Total Spine: -0.30    SVT (supraventricular tachycardia) (HCC)    TIA (transient ischemic attack) 12/05/2017   TIA (transient ischemic attack)    Past Surgical History:  Procedure Laterality Date   ABDOMINAL HYSTERECTOMY     APPENDECTOMY     BUNIONECTOMY Left    CARDIOVERSION N/A 03/08/2020   Procedure: CARDIOVERSION;  Surgeon: Lewayne Bunting, MD;  Location: [facility] ENDOSCOPY;  Service: Cardiovascular;  Laterality: N/A;   IR RADIOLOGIST EVAL & MGMT  01/05/2018   TEE WITHOUT CARDIOVERSION N/A 03/08/2020   Procedure: TRANSESOPHAGEAL ECHOCARDIOGRAM (TEE);  Surgeon: Lewayne Bunting, MD;  Location: [facility] ENDOSCOPY;  Service: Cardiovascular;  Laterality: N/A;    Allergies  Allergies  Allergen Reactions   Adhesive [Tape] Other (See Comments)    Heart monitor leads- CREATED RED WELTS!!   Other Other (See Comments)    Has had adverse reaction to a migraine medication Heart monitor leads- CREATED RED WELTS!!   Penicillins Itching    Did it involve swelling of the face/tongue/throat, SOB, or low BP? Unk Did it involve sudden or severe rash/hives, skin peeling, or any reaction on the inside of your mouth or nose?  Unk Did you need to seek medical attention at a hospital or doctor's office? No When did it last happen? Childhood If all above answers are "NO", may proceed with cephalosporin use.    Simvastatin Itching   Sumatriptan Succinate Other (See Comments)    Hypotension      Labs/Other Studies Reviewed    The following studies were reviewed today:  Cardiac Studies & Procedures       ECHOCARDIOGRAM  ECHOCARDIOGRAM COMPLETE 01/13/2020  Narrative ECHOCARDIOGRAM REPORT    Patient Name:   Allison Richardson Date of Exam: 01/13/2020 Medical Rec #:  [id number]   Height:       66.0 in Accession #:    [id number]  Weight:       184.8 lb Date of Birth:  07/18/1946    BSA:          1.934 m Patient Age:    74 years    BP:           128/69 mmHg Patient Gender: F           HR:           67 bpm. Exam Location:  Inpatient  Procedure: 2D Echo  Indications:    atrial fibrillation 427.31  History:        Patient has prior history of Echocardiogram examinations, most recent 12/06/2017. Arrythmias:Atrial Fibrillation; Risk Factors:Hypertension and Dyslipidemia.  Sonographer:    Delcie Roch Referring Phys: [id number] ROBERT SCHERTZ  IMPRESSIONS   1. Normal LV function; mild LVH;  mild MR; biatrial enlargement; mild TR. 2. Left ventricular ejection fraction, by estimation, is 60 to 65%. The left ventricle has normal function. The left ventricle has no regional wall motion abnormalities. There is mild left ventricular hypertrophy. Left ventricular diastolic function could not be evaluated. 3. Right ventricular systolic function is normal. The right ventricular size is normal. There is mildly elevated pulmonary artery systolic pressure. 4. Left atrial size was mild to moderately dilated. 5. Right atrial size was mildly dilated. 6. The mitral valve is normal in structure. Mild mitral valve regurgitation. No evidence of mitral stenosis. 7. The aortic valve is tricuspid. Aortic valve regurgitation is not  visualized. Mild aortic valve sclerosis is present, with no evidence of aortic valve stenosis. 8. The inferior vena cava is normal in size with greater than 50% respiratory variability, suggesting right atrial pressure of 3 mmHg.  FINDINGS Left Ventricle: Left ventricular ejection fraction, by estimation, is 60 to 65%. The left ventricle has normal function. The left ventricle has no regional wall motion abnormalities. The left ventricular internal cavity size was normal in size. There is mild left ventricular hypertrophy. Left ventricular diastolic function could not be evaluated due to atrial fibrillation. Left ventricular diastolic function could not be evaluated.  Right Ventricle: The right ventricular size is normal. Right ventricular systolic function is normal. There is mildly elevated pulmonary artery systolic pressure. The tricuspid regurgitant velocity is 2.80 m/s, and with an assumed right atrial pressure of 3 mmHg, the estimated right ventricular systolic pressure is 34.4 mmHg.  Left Atrium: Left atrial size was mild to moderately dilated.  Right Atrium: Right atrial size was mildly dilated.  Pericardium: There is no evidence of pericardial effusion.  Mitral Valve: The mitral valve is normal in structure. Normal mobility of the mitral valve leaflets. Mild mitral annular calcification. Mild mitral valve regurgitation. No evidence of mitral valve stenosis.  Tricuspid Valve: The tricuspid valve is normal in structure. Tricuspid valve regurgitation is mild . No evidence of tricuspid stenosis.  Aortic Valve: The aortic valve is tricuspid. Aortic valve regurgitation is not visualized. Mild aortic valve sclerosis is present, with no evidence of aortic valve stenosis.  Pulmonic Valve: The pulmonic valve was normal in structure. Pulmonic valve regurgitation is trivial. No evidence of pulmonic stenosis.  Aorta: The aortic root is normal in size and structure.  Venous: The inferior vena  cava is normal in size with greater than 50% respiratory variability, suggesting right atrial pressure of 3 mmHg.  Additional Comments: Normal LV function; mild LVH; mild MR; biatrial enlargement; mild TR.   LEFT VENTRICLE PLAX 2D LVIDd:         4.20 cm LVIDs:         2.90 cm LV PW:         0.90 cm LV IVS:        1.00 cm LVOT diam:     1.80 cm LV SV:         63 LV SV Index:   33 LVOT Area:     2.54 cm   RIGHT VENTRICLE RV S prime:     9.46 cm/s TAPSE (M-mode): 1.5 cm  LEFT ATRIUM             Index       RIGHT ATRIUM           Index LA diam:        4.70 cm 2.43 cm/m  RA Area:     19.20 cm LA Vol (A2C):  75.9 ml 39.25 ml/m RA Volume:   54.60 ml  28.24 ml/m LA Vol (A4C):   68.7 ml 35.53 ml/m LA Biplane Vol: 72.2 ml 37.34 ml/m AORTIC VALVE LVOT Vmax:   116.00 cm/s LVOT Vmean:  78.200 cm/s LVOT VTI:    0.249 m  AORTA Ao Root diam: 2.70 cm  TRICUSPID VALVE TR Peak grad:   31.4 mmHg TR Vmax:        280.00 cm/s  SHUNTS Systemic VTI:  0.25 m Systemic Diam: 1.80 cm  Olga Millers MD Electronically signed by Olga Millers MD Signature Date/Time: 01/13/2020/3:45:37 PM    Final   TEE  ECHO TEE 03/08/2020  Narrative TRANSESOPHOGEAL ECHO REPORT    Patient Name:   Lodema Hong Date of Exam: 03/08/2020 Medical Rec #:  [id number]   Height:       66.5 in Accession #:    [id number]  Weight:       186.8 lb Date of Birth:  20-May-1946    BSA:          1.954 m Patient Age:    74 years    BP:           141/60 mmHg Patient Gender: F           HR:           66 bpm. Exam Location:  Inpatient  Procedure: Transesophageal Echo  Indications:    atrial fibrillation  History:        Patient has prior history of Echocardiogram examinations, most recent 01/13/2020. TIA; Risk Factors:Hypertension and Dyslipidemia. SVT.  Sonographer:    Celene Skeen RDCS (AE) Referring Phys: [id number] BRIAN S CRENSHAW  PROCEDURE: The transesophogeal probe was passed without difficulty through the  esophogus of the patient. Sedation performed by different physician. The patient developed no complications during the procedure. A direct current cardioversion was performed.  IMPRESSIONS   1. Normal LV function; mild MR; moderate LAE; no LAA thrombus; mild TR. 2. Left ventricular ejection fraction, by estimation, is 55 to 60%. The left ventricle has normal function. The left ventricle has no regional wall motion abnormalities. 3. Right ventricular systolic function is normal. The right ventricular size is normal. 4. Left atrial size was moderately dilated. No left atrial/left atrial appendage thrombus was detected. 5. The mitral valve is normal in structure. Mild mitral valve regurgitation. 6. The aortic valve is tricuspid. Aortic valve regurgitation is not visualized.  FINDINGS Left Ventricle: Left ventricular ejection fraction, by estimation, is 55 to 60%. The left ventricle has normal function. The left ventricle has no regional wall motion abnormalities. The left ventricular internal cavity size was normal in size. There is no left ventricular hypertrophy.  Right Ventricle: The right ventricular size is normal. Right vetricular wall thickness was not assessed. Right ventricular systolic function is normal.  Left Atrium: Left atrial size was moderately dilated. No left atrial/left atrial appendage thrombus was detected.  Right Atrium: Right atrial size was normal in size.  Pericardium: There is no evidence of pericardial effusion.  Mitral Valve: The mitral valve is normal in structure. Mild mitral valve regurgitation.  Tricuspid Valve: The tricuspid valve is normal in structure. Tricuspid valve regurgitation is mild.  Aortic Valve: The aortic valve is tricuspid. Aortic valve regurgitation is not visualized.  Pulmonic Valve: The pulmonic valve was normal in structure. Pulmonic valve regurgitation is mild.  Aorta: The aortic root is normal in size and structure. There is minimal  (Grade I) plaque involving the descending aorta.  IAS/Shunts: No atrial level shunt detected by color flow Doppler.  Additional Comments: Normal LV function; mild MR; moderate LAE; no LAA thrombus; mild TR.  Olga Millers MD Electronically signed by Olga Millers MD Signature Date/Time: 03/08/2020/10:16:30 AM    Final   MONITORS  LONG TERM MONITOR (3-14 DAYS) 01/14/2021  Narrative Patch Wear Time:  2 days and 23 hours (2022-03-25T13:20:15-0400 to 2022-03-28T13:05:53-0400)  Atrial Fibrillation occurred continuously (100% burden), ranging from 39-158 bpm (avg of 69 bpm). Isolated VEs were rare (<1.0%), and no VE Couplets or VE Triplets were present.  Atrial fibrillation; rate controlled Olga Millers          Recent Labs: 01/30/2023: ALT 18; BUN 17; Creat 0.84; Hemoglobin 12.9; Platelets 272; Potassium 4.3; Sodium 140; TSH 2.76  Recent Lipid Panel    Component Value Date/Time   CHOL 143 01/30/2023 0837   CHOL 173 01/24/2022 0859   TRIG 104 01/30/2023 0837   HDL 46 (L) 01/30/2023 0837   HDL 48 01/24/2022 0859   CHOLHDL 3.1 01/30/2023 0837   VLDL 19 12/06/2017 0617   LDLCALC 78 01/30/2023 0837    History of Present Illness    77 year old female with the above past medical history including permanent atrial fibrillation, hypertension, hyperlipidemia, TIA, splenic infarct, cerebral aneurysm, hypothyroidism, and GERD.  She has a history of TIA.  Renal Dopplers in December 2020 showed no renal artery stenosis.  Echocardiogram in April 2021 showed normal LV function, mild LVH, biatrial enlargement, mild mitral valve regurgitation, mild tricuspid valve regurgitation.  She was hospitalized in April 2021 in the setting of atrial fibrillation.  She was noted to have splenic infarct on CT.  TEE in May 2021 showed normal LV function, mild mitral valve regurgitation, moderate left atrial enlargement, no left atrial appendage thrombus, mild tricuspid valve regurgitation.  She subsequently  underwent successful DCCV with restoration of sinus rhythm.  Cardiac monitor in April 2022 showed recurrent atrial fibrillation, rate controlled.  She has since been managed with a rate control strategy.  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  She was last seen in the office on 06/03/2023 and was stable from a cardiac standpoint.  She contacted our office on 09/11/2023 with concern for elevated blood pressure, in the setting of panic attack, mild intermittent chest pain.   She presents today for follow-up accompanied by her husband.  Since her last visit and since she contacted our office she has been stable from a cardiac standpoint.  She states that she woke up with a panic attack on 09/06/2023.  Her heart was racing, BP was elevated.  She did deep breathing exercises and her BP/HR improved. She noted mild chest pressure the next day, "twinges" and "aches" in her chest.  She denies any significant palpitations since, denies exertional symptoms concerning for angina.  She states that her former job caused her a significant amount of stress and anxiety, particular this time of year.  She thinks that when she woke up in the middle of the night with her symptoms she was having a dream related to her old job.  He used to have panic attacks more regularly, this is now a rare occurrence.  She notes she has been taking amlodipine 2.5 mg daily.  She will take an additional half tablet daily as needed for systolic blood pressure greater than 150.  She  states this has been working well for her that her BP has been well-controlled.  Other than her recent episode, she reports feeling well.  Home Medications    Current Outpatient Medications  Medication Sig Dispense Refill   acetaminophen (TYLENOL) 500 MG tablet Take 500 mg by mouth every 6 (six) hours as  needed (for pain.).     amLODipine (NORVASC) 2.5 MG tablet Take 1 tablet (2.5 mg total) by mouth daily. Takes differently than ordered (Patient taking differently: Take 2.5 mg by mouth daily. Pt is taking 2.5 mg daily and adds an additional 1/2 tablet if needed.) 90 tablet 3   apixaban (ELIQUIS) 5 MG TABS tablet Take 1 tablet (5 mg total) by mouth 2 (two) times daily. 180 tablet 1   atorvastatin (LIPITOR) 10 MG tablet TAKE ONE TABLET BY MOUTH AT BEDTIME 90 tablet 3   Cholecalciferol (VITAMIN D-3) 25 MCG (1000 UT) CAPS Take 1,000 Units by mouth daily.     hydrochlorothiazide (HYDRODIURIL) 12.5 MG tablet Take 1 tablet (12.5 mg total) by mouth daily. 90 tablet 2   losartan (COZAAR) 50 MG tablet TAKE ONE (1) TABLET BY MOUTH TWO (2) TIMES DAILY 180 tablet 2   nebivolol (BYSTOLIC) 5 MG tablet TAKE 1 TABLET TWICE A DAY 180 tablet 3   omeprazole (PRILOSEC) 40 MG capsule Take 1 capsule (40 mg total) by mouth daily. 90 capsule 3   Probiotic Product (DIGESTIVE ADVANTAGE PO) Take 1 capsule by mouth daily.     vitamin C (ASCORBIC ACID) 500 MG tablet Take 500 mg by mouth daily.     Wheat Dextrin (BENEFIBER DRINK MIX PO) Take 4.2 g by mouth daily. Mix 1 teaspoonful of powder into 6-8 ounces of coffee and drink in the morning (once a day)     No current facility-administered medications for this visit.     Review of Systems    She denies dyspnea, pnd, orthopnea, n, v, dizziness, syncope, edema, weight gain, or early satiety. All other systems reviewed and are otherwise negative except as noted above.   Physical Exam    VS:  BP 116/70 (BP Location: Left Arm, Patient Position: Sitting, Cuff Size: Normal)   Pulse 70   Ht 5\' 7"  (1.702 m)   Wt 188 lb 9.6 oz (85.5 kg)   SpO2 95%   BMI 29.54 kg/m   GEN: Well nourished, well developed, in no acute distress. HEENT: normal. Neck: Supple, no JVD, carotid bruits, or masses. Cardiac: IRIR, no murmurs, rubs, or gallops. No clubbing, cyanosis, edema.  Radials/DP/PT  2+ and equal bilaterally.  Respiratory:  Respirations regular and unlabored, clear to auscultation bilaterally. GI: Soft, nontender, nondistended, BS + x 4. MS: no deformity or atrophy. Skin: warm and dry, no rash. Neuro:  Strength and sensation are intact. Psych: Normal affect.  Accessory Clinical Findings    ECG personally reviewed by me today - EKG Interpretation Date/Time:  Thursday September 17 2023 14:10:12 EST Ventricular Rate:  70 PR Interval:    QRS Duration:  72 QT Interval:  396 QTC Calculation: 427 R Axis:   76  Text Interpretation: Atrial fibrillation When compared with ECG of 03-Jun-2023 09:00, No significant change was found Confirmed by Bernadene Person ([id number]) on 09/17/2023 2:47:37 PM  - no acute changes.   Lab Results  Component Value Date   WBC 7.0 01/30/2023   HGB 12.9 01/30/2023   HCT 39.2 01/30/2023   MCV 89.3 01/30/2023   PLT 272 01/30/2023   Lab Results  Component Value  Date   CREATININE 0.84 01/30/2023   BUN 17 01/30/2023   NA 140 01/30/2023   K 4.3 01/30/2023   CL 102 01/30/2023   CO2 27 01/30/2023   Lab Results  Component Value Date   ALT 18 01/30/2023   AST 15 01/30/2023   ALKPHOS 89 01/24/2022   BILITOT 0.6 01/30/2023   Lab Results  Component Value Date   CHOL 143 01/30/2023   HDL 46 (L) 01/30/2023   LDLCALC 78 01/30/2023   TRIG 104 01/30/2023   CHOLHDL 3.1 01/30/2023    Lab Results  Component Value Date   HGBA1C 6.6 (H) 01/30/2023    Assessment & Plan    1. Chest pain/palpitations: Recent episode of chest discomfort and palpitations that occurred in the setting of what she describes as a panic attack. She denies any significant palpitations since, denies exertional symptoms concerning for angina. Through shared decision making, will defer any additional testing at this time.  Continue to monitor symptoms.  Reviewed ED precautions.   2. Permanent atrial fibrillation: Rate controlled.  Recent episode of palpitations as above.   Continue nebivolol, Eliquis.  3. Hypertension: Recent episode of elevated BP in the setting of a reported panic attack as above.  BP has been generally well-controlled. She notes she has been taking amlodipine 2.5 mg daily.  She will take an additional half tablet daily as needed for systolic blood pressure greater than 150.  She states this has been working well for her, to continue.  Continue to monitor BP.  Otherwise, continue current antihypertensive regimen.  4. Hyperlipidemia: LDL was 78 in 01/2023.  Continue Lipitor.  5. History of splenic infarct/TIA: She is on lifelong anticoagulation with Eliquis.  6. Cerebral aneurysm: Managed by neurology/interventional radiology.  Most recent MRI showed no change in size.  7. Disposition: Follow-up per recall with Dr. Jens Som in 05/2024, sooner if needed.      Joylene Grapes, NP 09/17/2023, 6:48 PM

## 2023-11-17 ENCOUNTER — Other Ambulatory Visit (HOSPITAL_COMMUNITY): Payer: Self-pay | Admitting: Interventional Radiology

## 2023-11-17 DIAGNOSIS — I671 Cerebral aneurysm, nonruptured: Secondary | ICD-10-CM

## 2023-11-24 ENCOUNTER — Ambulatory Visit (HOSPITAL_COMMUNITY)
Admission: RE | Admit: 2023-11-24 | Discharge: 2023-11-24 | Disposition: A | Discharge status: Home / Self Care | Payer: Medicare Other | Source: Ambulatory Visit | Attending: Interventional Radiology | Admitting: Interventional Radiology

## 2023-11-24 DIAGNOSIS — I671 Cerebral aneurysm, nonruptured: Secondary | ICD-10-CM | POA: Insufficient documentation

## 2023-12-07 ENCOUNTER — Telehealth (HOSPITAL_COMMUNITY): Payer: Self-pay

## 2023-12-07 NOTE — Telephone Encounter (Signed)
 Pt agreed to f/u in 2 years with an mra. AB

## 2023-12-25 ENCOUNTER — Encounter: Payer: Self-pay | Admitting: Cardiology

## 2023-12-25 ENCOUNTER — Telehealth: Payer: Self-pay | Admitting: Cardiology

## 2023-12-25 DIAGNOSIS — Z79899 Other long term (current) drug therapy: Secondary | ICD-10-CM

## 2023-12-25 MED ORDER — FUROSEMIDE 20 MG PO TABS: 20.0000 mg | ORAL_TABLET | Freq: Every day | ORAL | 3 refills | Status: DC

## 2023-12-25 MED ORDER — AMLODIPINE BESYLATE 2.5 MG PO TABS: 3.7500 mg | ORAL_TABLET | Freq: Every day | ORAL | 3 refills | Status: DC

## 2023-12-25 NOTE — Telephone Encounter (Signed)
 Lewayne Bunting, MD  Freddi Starr, RN36 minutes ago (1:49 PM)    She can take 3.75 mg if that is what she is taking now [employer]  Lewayne Bunting, MD45 minutes ago (1:40 PM)    Hi Dr, Jens Som Please clarify, should she continue taking the present dose that she is taking which is 1.5 tablet (3.75mg ) or the dose that is ordered which is 2.5mg ?   Lewayne Bunting, MD  Freddi Starr, RN1 hour ago (1:17 PM)    Continue amlodipine at present dose; DC hydrochlorothiazide; lasix 20 mg daily; bmet one week Olga Millers     Attempted to call patient, no answer left message requesting a call back.

## 2023-12-25 NOTE — Telephone Encounter (Signed)
 Patient identification verified by 2 forms. Marilynn Rail, RN    Called and spoke to patient  Relayed provider recommendations  Reviewed Rx instruction/education  Patient aware:   -RX sent to preferred pharmacy  -present to lab in 1 week for follow up  Patient requested follow up OV  Patient scheduled for 3/31 at 8:00am with Dr. Jens Som  Patient verbalized understanding, no questions at this time

## 2023-12-25 NOTE — Telephone Encounter (Signed)
 Error

## 2023-12-25 NOTE — Telephone Encounter (Signed)
   Pt c/o of Chest Pain: STAT if active CP, including tightness, pressure, jaw pain, radiating pain to shoulder/upper arm/back, CP unrelieved by Nitro. Symptoms reported of SOB, nausea, vomiting, sweating.  1. Are you having CP right now? no    2. Are you experiencing any other symptoms (ex. SOB, nausea, vomiting, sweating)? no   3. Is your CP continuous or coming and going? Coming and going   4. Have you taken Nitroglycerin? no   5. How long have you been experiencing CP? Few weeks  PCP is concerned that pt keeps having Intermittent arm pain that goes across her chest   6. If NO CP at time of call then end call with telling Pt to call back or call 911 if Chest pain returns prior to return call from triage team.

## 2023-12-25 NOTE — Telephone Encounter (Signed)
 Patient identification verified by 2 forms. Marilynn Rail, RN    Called and spoke to patient  Patient states:   -has swelling in legs and throughout body   -swelling on going for sometime   -excessive swelling a month ago   -Takes amlodipine 3.75mg  daily (takes 1 and half tablet of 2.5mg  daily)   -she has SOB after minimal exertion   -takes hydrochlorothiazide 12.5mg  daily   -3/8: 139/72  -3/8: 135/81 ` -3/9: 118/67  -3/9: 137/78  -3/10: 111/65  -3/10: 158/70  -3/11: 120/69  -3/11: 144/82  -3/12: 141/75  -3/12: 133/78  -3/12: 145/85  -3/13: 125/72  -3/13: 161/71  -3/14: 128/76  -HR: 69-75 Informed patient:   -per chart review dosing for Amlodipine is one tablet daily (2.5mg ), will confirm   -message sent to Dr. Jens Som  Patient verbalized understanding, no questions at this time

## 2023-12-25 NOTE — Telephone Encounter (Signed)
 Pt c/o swelling/edema: STAT if pt has developed SOB within 24 hours  If swelling, where is the swelling located?   Legs  How much weight have you gained and in what time span?   Some  Have you gained 2 pounds in a day or 5 pounds in a week?   No  Do you have a log of your daily weights (if so, list)?   no  Are you currently taking a fluid pill?   Yes  Are you currently SOB?   No  Have you traveled recently in a car or plane for an extended period of time?   No  Patient called and added she has also been having swelling in her legs and SOB.

## 2023-12-29 NOTE — Progress Notes (Signed)
 HPI: FU atrial fibrillation and hypertension. Also h/o TIA.  Renal Dopplers December 2020 showed no renal artery stenosis. Echocardiogram April 2021 showed normal LV function, mild left ventricular hypertrophy, biatrial enlargement, mild mitral regurgitation and mild tricuspid regurgitation.  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 TEE May 2021 showed normal LV function, mild mitral regurgitation, moderate left atrial enlargement, no left atrial appendage thrombus and mild tricuspid regurgitation. Patient subsequently underwent cardioversion successfully to sinus bradycardia.  FU Monitor April 2022 showed atrial fibrillation rate controlled.  Now treated with rate control.  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  Contacted the office recently with increased lower extremity edema and dyspnea and added to my schedule today.  Since last seen, in late January she had a viral syndrome associated with weakness, dyspnea and GI side effects as well.  Also pain in her left shoulder that was persistent for 2 weeks.  This is slowly improving.  She now denies dyspnea.  She has noted bilateral lower extremity edema particularly since increasing dose of amlodipine.  She has not had syncope.  Current Outpatient Medications  Medication Sig Dispense Refill   acetaminophen (TYLENOL) 500 MG tablet Take 500 mg by mouth every 6 (six) hours as needed (for pain.).     amLODipine (NORVASC) 2.5 MG tablet Take 1.5 tablets (3.75 mg total) by mouth daily. 135 tablet 3   apixaban (ELIQUIS) 5 MG TABS tablet Take 1 tablet (5 mg total) by mouth 2 (two) times daily. 180 tablet 1   atorvastatin (LIPITOR) 10 MG tablet TAKE ONE TABLET BY MOUTH AT BEDTIME 90 tablet 3   Cholecalciferol (VITAMIN D-3) 25 MCG (1000 UT) CAPS Take 1,000 Units by mouth daily.      furosemide (LASIX) 20 MG tablet Take 1 tablet (20 mg total) by mouth daily. 90 tablet 3   losartan (COZAAR) 50 MG tablet TAKE ONE (1) TABLET BY MOUTH TWO (2) TIMES DAILY 180 tablet 2   nebivolol (BYSTOLIC) 5 MG tablet TAKE 1 TABLET TWICE A DAY 180 tablet 3   omeprazole (PRILOSEC) 40 MG capsule Take 1 capsule (40 mg total) by mouth daily. 90 capsule 3   Probiotic Product (DIGESTIVE ADVANTAGE PO) Take 1 capsule by mouth daily.     vitamin C (ASCORBIC ACID) 500 MG tablet Take 500 mg by mouth daily.     Wheat Dextrin (BENEFIBER DRINK MIX PO) Take 4.2 g by mouth daily. Mix 1 teaspoonful of powder into 6-8 ounces of coffee and drink in the morning (once a day)     No current facility-administered medications for this visit.     Past Medical History:  Diagnosis Date   GERD (gastroesophageal reflux disease)    Hypercholesteremia    Hyperlipidemia 12/05/2017   Hypertension    Hypothyroid    Lyme disease    Osteopenia after menopause 05/14/2018   DEXA 04/2018, [facility]: Impression- Osteopenia   T Scores  R Femur Neck- -0.70 L Femur Neck- -1.20 R Total Femur- -0.40 L Total Femur- -1.50 AP Total Spine: -0.30    SVT (supraventricular tachycardia) (HCC)    TIA (transient ischemic attack) 12/05/2017   TIA (transient ischemic attack)     Past Surgical History:  Procedure Laterality Date   ABDOMINAL HYSTERECTOMY     APPENDECTOMY     BUNIONECTOMY Left    CARDIOVERSION N/A  03/08/2020   Procedure: CARDIOVERSION;  Surgeon: Lewayne Bunting, MD;  Location: [facility] ENDOSCOPY;  Service: Cardiovascular;  Laterality: N/A;   IR RADIOLOGIST EVAL & MGMT  01/05/2018   TEE WITHOUT CARDIOVERSION N/A 03/08/2020   Procedure: TRANSESOPHAGEAL ECHOCARDIOGRAM (TEE);  Surgeon: Lewayne Bunting, MD;  Location: [facility] ENDOSCOPY;  Service: Cardiovascular;  Laterality: N/A;    Social History   Socioeconomic History   Marital status: Married    Spouse name: Not on file   Number of children: 0   Years of education: 18-20    Highest education level: Not on file  Occupational History   Occupation: Retired  Tobacco Use   Smoking status: Never   Smokeless tobacco: Never  Vaping Use   Vaping status: Never Used  Substance and Sexual Activity   Alcohol use: No   Drug use: No   Sexual activity: Yes  Other Topics Concern   Not on file  Social History Narrative   Lives with husband   Caffeine use: Coffee daily   Right handed   Social Drivers of Corporate investment banker Strain: Not on file  Food Insecurity: Not on file  Transportation Needs: Not on file  Physical Activity: Not on file  Stress: Not on file  Social Connections: Not on file  Intimate Partner Violence: Not on file    Family History  Problem Relation Age of Onset   Stroke Mother    High blood pressure Mother    High Cholesterol Mother    Hypertension Mother    Stroke Father    High blood pressure Father    High Cholesterol Father    Cancer Father    Hypertension Father    Lung cancer Father    Heart attack Maternal Grandfather    Heart disease Maternal Grandfather    Stroke Maternal Grandfather    Early death Paternal Grandmother    Heart attack Paternal Grandmother    Heart disease Paternal Grandmother    Heart attack Paternal Grandfather    Heart disease Paternal Grandfather     ROS: no fevers or chills, productive cough, hemoptysis, dysphasia, odynophagia, melena, hematochezia, dysuria, hematuria, rash, seizure activity, orthopnea, PND, claudication. Remaining systems are negative.  Physical Exam: Well-developed well-nourished in no acute distress.  Skin is warm and dry.  HEENT is normal.  Neck is supple.  Chest is clear to auscultation with normal expansion.  Cardiovascular exam is irregular Abdominal exam nontender or distended. No masses palpated. Extremities show trace edema. neuro grossly intact  EKG Interpretation Date/Time:  Monday January 11 2024 07:50:55 EDT Ventricular Rate:  61 PR Interval:    QRS  Duration:  80 QT Interval:  404 QTC Calculation: 406 R Axis:   13  Text Interpretation: Atrial fibrillation Confirmed by Olga Millers ([id number]) on 01/11/2024 7:51:57 AM    A/P  1 chronic diastolic congestive heart failure- plan repeat echo; continue Lasix at present dose.  Check potassium and renal function.  2 permanent atrial fibrillation-continue beta-blocker at present dose for rate control.  Continue apixaban.  Check hemoglobin.  3 previous splenic infarct/TIA-as outlined in previous notes she will require lifelong anticoagulation.  4 hypertension-patient's blood pressure is controlled.  However she is complaining of lower extremity edema.  Discontinue amlodipine.  Begin hydralazine 25 mg p.o. 3 times daily.  Follow blood pressure and advance as needed.  5 hyperlipidemia-continue statin.  Check lipids and liver.  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  7 fatigue-she describes some fatigue since  her viral syndrome in late January.  This is improving.  Will check hemoglobin and TSH for completeness.  Olga Millers, MD

## 2023-12-30 ENCOUNTER — Other Ambulatory Visit: Payer: Self-pay

## 2023-12-30 ENCOUNTER — Encounter: Payer: Self-pay | Admitting: Cardiology

## 2023-12-30 DIAGNOSIS — Z79899 Other long term (current) drug therapy: Secondary | ICD-10-CM

## 2024-01-06 ENCOUNTER — Encounter: Payer: Self-pay | Admitting: *Deleted

## 2024-01-06 LAB — BASIC METABOLIC PANEL
BUN/Creatinine Ratio: 20 (ref 12–28)
BUN: 15 mg/dL (ref 8–27)
CO2: 23 mmol/L (ref 20–29)
Calcium: 9.2 mg/dL (ref 8.7–10.3)
Chloride: 100 mmol/L (ref 96–106)
Creatinine, Ser: 0.75 mg/dL (ref 0.57–1.00)
Glucose: 88 mg/dL (ref 70–99)
Potassium: 4.6 mmol/L (ref 3.5–5.2)
Sodium: 140 mmol/L (ref 134–144)
eGFR: 81 mL/min/{1.73_m2} (ref >59)

## 2024-01-11 ENCOUNTER — Ambulatory Visit: Attending: Cardiology | Admitting: Cardiology

## 2024-01-11 ENCOUNTER — Encounter: Payer: Self-pay | Admitting: Cardiology

## 2024-01-11 VITALS — BP 136/78 | HR 61 | Ht 67.0 in | Wt 188.0 lb

## 2024-01-11 DIAGNOSIS — I4821 Permanent atrial fibrillation: Secondary | ICD-10-CM | POA: Insufficient documentation

## 2024-01-11 DIAGNOSIS — E785 Hyperlipidemia, unspecified: Secondary | ICD-10-CM | POA: Insufficient documentation

## 2024-01-11 DIAGNOSIS — I10 Essential (primary) hypertension: Secondary | ICD-10-CM | POA: Insufficient documentation

## 2024-01-11 DIAGNOSIS — I5032 Chronic diastolic (congestive) heart failure: Secondary | ICD-10-CM | POA: Diagnosis not present

## 2024-01-11 MED ORDER — HYDRALAZINE HCL 25 MG PO TABS: 25.0000 mg | ORAL_TABLET | Freq: Three times a day (TID) | ORAL | 3 refills | Status: DC

## 2024-01-11 NOTE — Patient Instructions (Signed)
 Medication Instructions:   STOP AMLODIPINE  START HYDRALAZINE 25 MG ONE TABLET THREE TIMES DAILY  *If you need a refill on your cardiac medications before your next appointment, please call your pharmacy*  Lab Work:  Your physician recommends that you return for lab work FASTING  If you have labs (blood work) drawn today and your tests are completely normal, you will receive your results only by: MyChart Message (if you have MyChart) OR A paper copy in the mail If you have any lab test that is abnormal or we need to change your treatment, we will call you to review the results.  Testing/Procedures:  Your physician has requested that you have an echocardiogram. Echocardiography is a painless test that uses sound waves to create images of your heart. It provides your doctor with information about the size and shape of your heart and how well your heart's chambers and valves are working. This procedure takes approximately one hour. There are no restrictions for this procedure. Please do NOT wear cologne, perfume, aftershave, or lotions (deodorant is allowed). Please arrive 15 minutes prior to your appointment time.  Please note: We ask at that you not bring children with you during ultrasound (echo/ vascular) testing. Due to room size and safety concerns, children are not allowed in the ultrasound rooms during exams. Our front office staff cannot provide observation of children in our lobby area while testing is being conducted. An adult accompanying a patient to their appointment will only be allowed in the ultrasound room at the discretion of the ultrasound technician under special circumstances. We apologize for any inconvenience. [address]  Follow-Up: At [facility], you and your health needs are our priority.  As part of our continuing mission to provide you with exceptional heart care, our providers are all part of one team.  This team includes your primary  Cardiologist (physician) and Advanced Practice Providers or APPs (Physician Assistants and Nurse Practitioners) who all work together to provide you with the care you need, when you need it.  Your next appointment:   6 month(s)  Provider:   Olga Millers, MD            1st Floor: - Lobby - Registration  - Pharmacy  - Lab - Cafe  2nd Floor: - PV Lab - Diagnostic Testing (echo, CT, nuclear med)  3rd Floor: - Vacant  4th Floor: - TCTS (cardiothoracic surgery) - AFib Clinic - Structural Heart Clinic - Vascular Surgery  - Vascular Ultrasound  5th Floor: - [facility] (general and EP) - Clinical Pharmacy for coumadin, hypertension, lipid, weight-loss medications, and med management appointments    Valet parking services will be available as well.

## 2024-01-14 ENCOUNTER — Encounter: Payer: Self-pay | Admitting: Cardiology

## 2024-01-14 DIAGNOSIS — I10 Essential (primary) hypertension: Secondary | ICD-10-CM

## 2024-01-15 MED ORDER — HYDRALAZINE HCL 50 MG PO TABS: 50.0000 mg | ORAL_TABLET | Freq: Three times a day (TID) | ORAL | 3 refills | Status: DC

## 2024-01-16 LAB — COMPREHENSIVE METABOLIC PANEL WITH GFR
ALT: 18 IU/L (ref 0–32)
AST: 18 IU/L (ref 0–40)
Albumin: 4.1 g/dL (ref 3.8–4.8)
Alkaline Phosphatase: 108 IU/L (ref 44–121)
BUN/Creatinine Ratio: 20 (ref 12–28)
BUN: 15 mg/dL (ref 8–27)
Bilirubin Total: 0.6 mg/dL (ref 0.0–1.2)
CO2: 22 mmol/L (ref 20–29)
Calcium: 9.3 mg/dL (ref 8.7–10.3)
Chloride: 101 mmol/L (ref 96–106)
Creatinine, Ser: 0.75 mg/dL (ref 0.57–1.00)
Globulin, Total: 2.3 g/dL (ref 1.5–4.5)
Glucose: 97 mg/dL (ref 70–99)
Potassium: 4.2 mmol/L (ref 3.5–5.2)
Sodium: 140 mmol/L (ref 134–144)
Total Protein: 6.4 g/dL (ref 6.0–8.5)
eGFR: 81 mL/min/{1.73_m2} (ref >59)

## 2024-01-16 LAB — CBC
Hematocrit: 35.8 % (ref 34.0–46.6)
Hemoglobin: 11.7 g/dL (ref 11.1–15.9)
MCH: 28.8 pg (ref 26.6–33.0)
MCHC: 32.7 g/dL (ref 31.5–35.7)
MCV: 88 fL (ref 79–97)
Platelets: 307 10*3/uL (ref 150–450)
RBC: 4.06 x10E6/uL (ref 3.77–5.28)
RDW: 13.2 % (ref 11.7–15.4)
WBC: 6 10*3/uL (ref 3.4–10.8)

## 2024-01-16 LAB — LIPID PANEL
Chol/HDL Ratio: 3.8 ratio (ref 0.0–4.4)
Cholesterol, Total: 151 mg/dL (ref 100–199)
HDL: 40 mg/dL (ref >39)
LDL Chol Calc (NIH): 86 mg/dL (ref 0–99)
Triglycerides: 143 mg/dL (ref 0–149)
VLDL Cholesterol Cal: 25 mg/dL (ref 5–40)

## 2024-01-16 LAB — TSH: TSH: 2.99 u[IU]/mL (ref 0.450–4.500)

## 2024-01-18 ENCOUNTER — Encounter: Payer: Self-pay | Admitting: *Deleted

## 2024-01-18 MED ORDER — CLONIDINE HCL 0.1 MG PO TABS: 0.1000 mg | ORAL_TABLET | Freq: Every day | ORAL | 11 refills | Status: DC

## 2024-01-18 NOTE — Addendum Note (Signed)
 Addended by: Freddi Starr on: 01/18/2024 10:40 AM   Modules accepted: Orders

## 2024-01-25 ENCOUNTER — Ambulatory Visit: Payer: Medicare Other | Admitting: Family Medicine

## 2024-01-25 ENCOUNTER — Encounter (HOSPITAL_BASED_OUTPATIENT_CLINIC_OR_DEPARTMENT_OTHER): Payer: Self-pay

## 2024-01-25 ENCOUNTER — Emergency Department (HOSPITAL_BASED_OUTPATIENT_CLINIC_OR_DEPARTMENT_OTHER)
Admission: EM | Admit: 2024-01-25 | Discharge: 2024-01-26 | Discharge status: Against Medical Advice | Attending: Cardiology | Admitting: Cardiology

## 2024-01-25 ENCOUNTER — Other Ambulatory Visit: Payer: Self-pay

## 2024-01-25 DIAGNOSIS — I071 Rheumatic tricuspid insufficiency: Secondary | ICD-10-CM | POA: Diagnosis not present

## 2024-01-25 DIAGNOSIS — I4821 Permanent atrial fibrillation: Secondary | ICD-10-CM | POA: Diagnosis present

## 2024-01-25 DIAGNOSIS — Z5321 Procedure and treatment not carried out due to patient leaving prior to being seen by health care provider: Secondary | ICD-10-CM | POA: Diagnosis not present

## 2024-01-25 DIAGNOSIS — E785 Hyperlipidemia, unspecified: Secondary | ICD-10-CM | POA: Diagnosis not present

## 2024-01-25 DIAGNOSIS — I10 Essential (primary) hypertension: Secondary | ICD-10-CM | POA: Insufficient documentation

## 2024-01-25 LAB — CBC WITH DIFFERENTIAL/PLATELET
Abs Immature Granulocytes: 0.04 10*3/uL (ref 0.00–0.07)
Basophils Absolute: 0.1 10*3/uL (ref 0.0–0.1)
Basophils Relative: 1 %
Eosinophils Absolute: 0.1 10*3/uL (ref 0.0–0.5)
Eosinophils Relative: 1 %
HCT: 35 % — ABNORMAL LOW (ref 36.0–46.0)
Hemoglobin: 11.3 g/dL — ABNORMAL LOW (ref 12.0–15.0)
Immature Granulocytes: 1 %
Lymphocytes Relative: 19 %
Lymphs Abs: 1.6 10*3/uL (ref 0.7–4.0)
MCH: 28.6 pg (ref 26.0–34.0)
MCHC: 32.3 g/dL (ref 30.0–36.0)
MCV: 88.6 fL (ref 80.0–100.0)
Monocytes Absolute: 0.5 10*3/uL (ref 0.1–1.0)
Monocytes Relative: 6 %
Neutro Abs: 6.1 10*3/uL (ref 1.7–7.7)
Neutrophils Relative %: 72 %
Platelets: 291 10*3/uL (ref 150–400)
RBC: 3.95 MIL/uL (ref 3.87–5.11)
RDW: 13.7 % (ref 11.5–15.5)
WBC: 8.3 10*3/uL (ref 4.0–10.5)
nRBC: 0 % (ref 0.0–0.2)

## 2024-01-25 LAB — COMPREHENSIVE METABOLIC PANEL WITH GFR
ALT: 37 U/L (ref 0–44)
AST: 30 U/L (ref 15–41)
Albumin: 3.9 g/dL (ref 3.5–5.0)
Alkaline Phosphatase: 90 U/L (ref 38–126)
Anion gap: 11 (ref 5–15)
BUN: 19 mg/dL (ref 8–23)
CO2: 27 mmol/L (ref 22–32)
Calcium: 8.8 mg/dL — ABNORMAL LOW (ref 8.9–10.3)
Chloride: 102 mmol/L (ref 98–111)
Creatinine, Ser: 0.94 mg/dL (ref 0.44–1.00)
GFR, Estimated: >60 mL/min (ref >60)
Glucose, Bld: 111 mg/dL — ABNORMAL HIGH (ref 70–99)
Potassium: 3.5 mmol/L (ref 3.5–5.1)
Sodium: 140 mmol/L (ref 135–145)
Total Bilirubin: 0.5 mg/dL (ref 0.0–1.2)
Total Protein: 7.1 g/dL (ref 6.5–8.1)

## 2024-01-25 NOTE — ED Triage Notes (Signed)
 Pt state she has been dealing with HTN issues for several weeks State she last medication change was 01/18/24 started on clonidine Pt has lists of meds she has been on with BP reading Denies N/V or HA Has an echo scheduled for tomorrow

## 2024-01-25 NOTE — ED Notes (Signed)
Called x 1 , no answer in WR 

## 2024-01-26 ENCOUNTER — Ambulatory Visit (HOSPITAL_BASED_OUTPATIENT_CLINIC_OR_DEPARTMENT_OTHER)
Admission: RE | Admit: 2024-01-26 | Discharge: 2024-01-26 | Disposition: A | Discharge status: Home / Self Care | Source: Ambulatory Visit | Attending: Cardiology | Admitting: Cardiology

## 2024-01-26 DIAGNOSIS — I10 Essential (primary) hypertension: Secondary | ICD-10-CM | POA: Diagnosis not present

## 2024-01-26 DIAGNOSIS — I4821 Permanent atrial fibrillation: Secondary | ICD-10-CM | POA: Insufficient documentation

## 2024-01-26 LAB — ECHOCARDIOGRAM COMPLETE
AR max vel: 2.04 cm2
AV Area VTI: 2.09 cm2
AV Area mean vel: 2.23 cm2
AV Mean grad: 4 mmHg
AV Peak grad: 9.1 mmHg
Ao pk vel: 1.51 m/s
Area-P 1/2: 4.33 cm2
Calc EF: 66.4 %
MV M vel: 3.83 m/s
MV Peak grad: 58.6 mmHg
S' Lateral: 2.6 cm
Single Plane A2C EF: 66.6 %
Single Plane A4C EF: 65.8 %

## 2024-02-01 ENCOUNTER — Other Ambulatory Visit: Payer: Self-pay | Admitting: Cardiology

## 2024-02-01 ENCOUNTER — Encounter: Payer: Self-pay | Admitting: Cardiology

## 2024-02-01 DIAGNOSIS — I4819 Other persistent atrial fibrillation: Secondary | ICD-10-CM

## 2024-02-01 MED ORDER — CLONIDINE HCL 0.1 MG PO TABS: 0.1000 mg | ORAL_TABLET | Freq: Two times a day (BID) | ORAL | 3 refills | Status: DC

## 2024-02-01 MED ORDER — IRBESARTAN 150 MG PO TABS: 150.0000 mg | ORAL_TABLET | Freq: Every day | ORAL | 6 refills | Status: DC

## 2024-02-01 NOTE — Telephone Encounter (Signed)
 Prescription refill request for Eliquis  received. Indication:afib Last office visit:3/25 Scr:0.94  4/25 Age: 78 Weight:85  kg  Prescription refilled

## 2024-02-01 NOTE — Addendum Note (Signed)
 Addended by: Render Carrie on: 02/01/2024 05:14 PM   Modules accepted: Orders

## 2024-02-04 ENCOUNTER — Telehealth: Payer: Self-pay | Admitting: Cardiology

## 2024-02-04 DIAGNOSIS — I10 Essential (primary) hypertension: Secondary | ICD-10-CM

## 2024-02-04 NOTE — Telephone Encounter (Signed)
 Attempted to call patient x2, no answer, unable to leave a voicemail.

## 2024-02-04 NOTE — Telephone Encounter (Signed)
 Attempted to call NL triage multiple times due to STAT call and got no answer

## 2024-02-04 NOTE — Telephone Encounter (Signed)
 disregard

## 2024-02-04 NOTE — Addendum Note (Signed)
 Addended by: Hilton Lucky on: 02/04/2024 04:47 PM   Modules accepted: Orders

## 2024-02-04 NOTE — Telephone Encounter (Signed)
 Pt reports continued BP issues and would like advisement. States she has been experiencing a HA after taking Irbesartan .  Reports as discomfort but it is "not terrible". She experiences blurred vision when she wakes but that "it goes away quickly".  Does not occur daily. Reports that a hot bath decreases her BP by "20-30 points".  Recent BP numbers:  Last night 9:07 pm 182/100, HR 66  Today:     3:34 am  178/96, HR 71    7:09 am  141/92, HR 62    8:48 am  133/69, HR 68  (was about 45 min after taking morning medications)     11:44 am  123/78, HR 56    2:12 pm  145/41, HR 66  She would like better BP control and would like advisement/appointment. Says that she has been taking Isosorbide BID since 4/7 BID, however the note I find told her to increase to BID on 4/16.  Either way pt would like advisement/appointment. Forwarding to MD for advisement.

## 2024-02-04 NOTE — Telephone Encounter (Signed)
 Pt c/o BP issue:  1. What are your last 5 BP readings? 178/96; 71; 141/92; 62; 136/69; 68; 123/78; 56; 145/74; 66; 182/100; 66 2. Are you having any other symptoms (ex. Dizziness, headache, blurred vision, passed out)? Headache; blurred vision in left eye 3. What is your medication issue? Patient would like better communication as to seeing why BP is higher at night time than morning. Seems like afternoon BP is not working properly. Woke up at 3:34 am and took hot bath for 30 min to get BP down

## 2024-02-08 MED ORDER — SPIRONOLACTONE 25 MG PO TABS
12.5000 mg | ORAL_TABLET | Freq: Every day | ORAL | 3 refills | Status: DC
Start: 2024-02-08 — End: 2024-06-22

## 2024-02-08 NOTE — Telephone Encounter (Signed)
Spoke with pt, Aware of dr Ludwig Clarks recommendations. New script sent to the pharmacy  Lab orders mailed to the pt  Follow up scheduled

## 2024-02-11 ENCOUNTER — Encounter: Payer: Self-pay | Admitting: Cardiology

## 2024-02-16 ENCOUNTER — Ambulatory Visit: Attending: Cardiology | Admitting: Pharmacist Clinician (PhC)/ Clinical Pharmacy Specialist

## 2024-02-16 ENCOUNTER — Encounter: Payer: Self-pay | Admitting: Pharmacist Clinician (PhC)/ Clinical Pharmacy Specialist

## 2024-02-16 VITALS — BP 132/84 | HR 56

## 2024-02-16 DIAGNOSIS — I10 Essential (primary) hypertension: Secondary | ICD-10-CM | POA: Diagnosis present

## 2024-02-16 MED ORDER — CHLORTHALIDONE 25 MG PO TABS: 25.0000 mg | ORAL_TABLET | Freq: Every day | ORAL | 3 refills | Status: DC

## 2024-02-16 NOTE — Patient Instructions (Signed)
 Follow up appointment: Friday June 6 at 11:30 am  Go to the lab in 1 week to check kidney function  Take your BP meds as follows:  AM:  nebivolol  5 mg, CHLORTHALIDONE 25 mg, spironolactone  12.5 mg  PM:  nebivolol  5 mg, irbesartan  150 mg   Take furosemide  20 mg daily if needed for swelling   Take clonidine  0.1 mg if systolic BP (top number) is > 865  Move 10 pm meds to 5-6 pm with others.  Check your blood pressure at home twice daily and keep record of the readings.  Your blood pressure goal is < 130/80  To check your pressure at home you will need to:  1. Sit up in a chair, with feet flat on the floor and back supported. Do not cross your ankles or legs. 2. Rest your left arm so that the cuff is about heart level. If the cuff goes on your upper arm,  then just relax the arm on the table, arm of the chair or your lap. If you have a wrist cuff, we  suggest relaxing your wrist against your chest (think of it as Pledging the Flag with the  wrong arm).  3. Place the cuff snugly around your arm, about 1 inch above the crook of your elbow. The  cords should be inside the groove of your elbow.  4. Sit quietly, with the cuff in place, for about 5 minutes. After that 5 minutes press the power  button to start a reading. 5. Do not talk or move while the reading is taking place.  6. Record your readings on a sheet of paper. Although most cuffs have a memory, it is often  easier to see a pattern developing when the numbers are all in front of you.  7. You can repeat the reading after 1-3 minutes if it is recommended  Make sure your bladder is empty and you have not had caffeine or tobacco within the last 30 min  Always bring your blood pressure log with you to your appointments. If you have not brought your monitor in to be double checked for accuracy, please bring it to your next appointment.  You can find a list of quality blood pressure cuffs at [email or web address]  Important lifestyle  changes to control high blood pressure  Intervention  Effect on the BP  Lose extra pounds and watch your waistline Weight loss is one of the most effective lifestyle changes for controlling blood pressure. If you're overweight or obese, losing even a small amount of weight can help reduce blood pressure. Blood pressure might go down by about 1 millimeter of mercury (mm Hg) with each kilogram (about 2.2 pounds) of weight lost.  Exercise regularly As a general goal, aim for at least 30 minutes of moderate physical activity every day. Regular physical activity can lower high blood pressure by about 5 to 8 mm Hg.  Eat a healthy diet Eating a diet rich in whole grains, fruits, vegetables, and low-fat dairy products and low in saturated fat and cholesterol. A healthy diet can lower high blood pressure by up to 11 mm Hg.  Reduce salt (sodium) in your diet Even a small reduction of sodium in the diet can improve heart health and reduce high blood pressure by about 5 to 6 mm Hg.  Limit alcohol One drink equals 12 ounces of beer, 5 ounces of wine, or 1.5 ounces of 80-proof liquor.  Limiting alcohol to less than one drink a day  for women or two drinks a day for men can help lower blood pressure by about 4 mm Hg.   If you have any questions or concerns please use My Chart to send questions or call the office at [phone]

## 2024-02-16 NOTE — Progress Notes (Signed)
 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   Other Other (See Comments)    Has had adverse reaction to a migraine medication Heart monitor leads- CREATED RED WELTS!!   Amlodipine  Swelling   Hydralazine  Nausea Only   Penicillins Itching    Did it involve swelling of the face/tongue/throat, SOB, or low BP? Unk Did it involve sudden or severe rash/hives, skin peeling, or any reaction on the inside of your mouth or nose? Unk Did you need to seek medical attention at a hospital or doctor's office? No When did it last happen? Childhood If all above answers are "NO", may proceed with cephalosporin use.    Simvastatin Itching   Sumatriptan Succinate Other (See Comments)    Hypotension     History of Present Illness    Allison Richardson is a 78 y.o. female patient of Dr Audery Blazing, in the office today for hypertension evaluation.   Patient has had multiple medication changes over the past month, none that have had impact on her pressure.  She and her husband are frustrated by the lack of improvement and the multiple medication changes.  She has resorted to taking hot baths at various times of day, which bring her pressure down 20-30 points.    Blood Pressure Goal:  130/80  Current Medications:  clonidine  0.1 mg bid, irbesartan  150 mg every day, nebivolol  5 mg every day, spironolactone  12.5 mg every day   Previously tried:  amlodipine  - LEE   Hydralazine  -  nausea  Family Hx:  both parents had htn, father had stroke at 51 no residual issues, mother had stroke at 75 passed; no siblings, no kids  Social Hx:      Tobacco: no  Alcohol:no  Caffeine: 2 coffee home brewed  Diet: eats out  - moved to retirement commun[place] 8 yers ago; doesn't liek the fofods; tries to limiit sodium content  ([place]);  resorts to sandwiches or salads; does eat at home on Sundays; one meal/day at   Exercise: walks dog twice daily, 20 min, about 5000 steps per day  Home BP readings:  has kept running averages of her pressure by week.  In the last few weeks that average has actually increased, in part likely due to the constant changing of medications.   Last week average 159/86 in the morning and 162/86 in the evening; highest readings in the pasts month were 193 systolic and 111 diastolic   Accessory Clinical Findings    Lab Results  Component Value Date   CREATININE 0.94 01/25/2024   BUN 19 01/25/2024   NA 140 01/25/2024   K 3.5 01/25/2024   CL 102 01/25/2024   CO2 27 01/25/2024   Lab Results  Component Value Date   ALT 37 01/25/2024   AST 30 01/25/2024   ALKPHOS 90 01/25/2024   BILITOT 0.5 01/25/2024   Lab Results  Component Value Date   HGBA1C 6.6 (H)  01/30/2023    Home Medications    Current Outpatient Medications  Medication Sig Dispense Refill   chlorthalidone (HYGROTON) 25 MG tablet Take 1 tablet (25 mg total) by mouth daily. 30 tablet 3   acetaminophen  (TYLENOL ) 500 MG tablet Take 500 mg by mouth every 6 (six) hours as needed (for pain.).     atorvastatin  (LIPITOR) 10 MG tablet TAKE ONE TABLET BY MOUTH AT BEDTIME 90 tablet 3   Cholecalciferol (VITAMIN D-3) 25 MCG (1000 UT) CAPS Take 1,000 Units by mouth daily.     cloNIDine  (CATAPRES ) 0.1 MG tablet Take 1 tablet (0.1 mg total) by mouth 2 (two) times daily. 180 tablet 3   ELIQUIS  5 MG TABS tablet TAKE ONE TABLET BY MOUTH TWICE DAILY 180 tablet 1   furosemide  (LASIX ) 20 MG tablet Take 1  tablet (20 mg total) by mouth daily. 90 tablet 3   irbesartan  (AVAPRO ) 150 MG tablet Take 1 tablet (150 mg total) by mouth daily. 30 tablet 6   nebivolol  (BYSTOLIC ) 5 MG tablet TAKE 1 TABLET TWICE A DAY 180 tablet 3   omeprazole  (PRILOSEC) 40 MG capsule Take 1 capsule (40 mg total) by mouth daily. 90 capsule 3   Probiotic Product (DIGESTIVE ADVANTAGE PO) Take 1 capsule by mouth daily.     spironolactone  (ALDACTONE ) 25 MG tablet Take 0.5 tablets (12.5 mg total) by mouth daily. 45 tablet 3   vitamin C (ASCORBIC ACID) 500 MG tablet Take 500 mg by mouth daily.     Wheat Dextrin (BENEFIBER DRINK MIX PO) Take 4.2 g by mouth daily. Mix 1 teaspoonful of powder into 6-8 ounces of coffee and drink in the morning (once a day)     No current facility-administered medications for this visit.         Assessment & Plan    Essential hypertension Assessment: BP is uncontrolled in office BP 132/84 mmHg; slightly above the goal (<130/80). Confusion with too many medication changes, none given time to really determine if would be beneficial.   Tolerates current well - does note dry eyes/mouth and fatigue with clonidine  Denies SOB, palpitation, chest pain, headaches Edema in legs improving, but still some Reiterated the importance of regular exercise and low salt diet   Plan:  Stop taking furosemide  daily and switch to prn for lower extremity edema Stop taking clonidine  daily and switch to prn SBP > 160 Start taking chlorthalidone 25 mg once daily in the mornings. Continue taking nebivolol  twice daily, move spironolactone  to mornings and continue irbesartan  in the evening.   Consolidate all meds to either 5-6 am or 5-6 pm.  Will consider switching nebivolol  to carvedilol at next visit.  Better BP management and beneficial for AF as well.  Patient to keep record of BP readings with heart rate NO MORE THAN TWICE DAILY and report to us  at the next visit Patient to follow up with me in 1 month  Labs ordered  today:  BMET in 1 week (already scheduled with Dr. Audery Blazing for tomorrow, will move out 1 week)   Oliviarose Punch PharmD CPP [facility]  [address] [place], [place] [address] [phone]

## 2024-02-16 NOTE — Assessment & Plan Note (Signed)
 Assessment: BP is uncontrolled in office BP 132/84 mmHg; slightly above the goal (<130/80). Confusion with too many medication changes, none given time to really determine if would be beneficial.   Tolerates current well - does note dry eyes/mouth and fatigue with clonidine  Denies SOB, palpitation, chest pain, headaches Edema in legs improving, but still some Reiterated the importance of regular exercise and low salt diet   Plan:  Stop taking furosemide  daily and switch to prn for lower extremity edema Stop taking clonidine  daily and switch to prn SBP > 160 Start taking chlorthalidone 25 mg once daily in the mornings. Continue taking nebivolol  twice daily, move spironolactone  to mornings and continue irbesartan  in the evening.   Consolidate all meds to either 5-6 am or 5-6 pm.  Will consider switching nebivolol  to carvedilol at next visit.  Better BP management and beneficial for AF as well.  Patient to keep record of BP readings with heart rate NO MORE THAN TWICE DAILY and report to us  at the next visit Patient to follow up with me in 1 month  Labs ordered today:  BMET in 1 week (already scheduled with Dr. Audery Blazing for tomorrow, will move out 1 week)

## 2024-02-25 ENCOUNTER — Ambulatory Visit: Payer: Self-pay | Admitting: Cardiology

## 2024-02-25 LAB — BASIC METABOLIC PANEL WITH GFR
BUN/Creatinine Ratio: 29 — ABNORMAL HIGH (ref 12–28)
BUN: 26 mg/dL (ref 8–27)
CO2: 21 mmol/L (ref 20–29)
Calcium: 9.6 mg/dL (ref 8.7–10.3)
Chloride: 96 mmol/L (ref 96–106)
Creatinine, Ser: 0.9 mg/dL (ref 0.57–1.00)
Glucose: 124 mg/dL — ABNORMAL HIGH (ref 70–99)
Potassium: 4.9 mmol/L (ref 3.5–5.2)
Sodium: 135 mmol/L (ref 134–144)
eGFR: 65 mL/min/{1.73_m2} (ref >59)

## 2024-02-27 ENCOUNTER — Encounter: Payer: Self-pay | Admitting: Pharmacist Clinician (PhC)/ Clinical Pharmacy Specialist

## 2024-03-18 ENCOUNTER — Encounter: Payer: Self-pay | Admitting: Pharmacist Clinician (PhC)/ Clinical Pharmacy Specialist

## 2024-03-18 ENCOUNTER — Ambulatory Visit: Attending: Cardiology | Admitting: Pharmacist Clinician (PhC)/ Clinical Pharmacy Specialist

## 2024-03-18 VITALS — BP 104/66 | HR 76

## 2024-03-18 DIAGNOSIS — I10 Essential (primary) hypertension: Secondary | ICD-10-CM

## 2024-03-18 NOTE — Progress Notes (Signed)
 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   Other Other (See Comments)    Has had adverse reaction to a migraine medication Heart monitor leads- CREATED RED WELTS!!   Amlodipine  Swelling   Hydralazine  Nausea Only   Penicillins Itching    Did it involve swelling of the face/tongue/throat, SOB, or low BP? Unk Did it involve sudden or severe rash/hives, skin peeling, or any reaction on the inside of your mouth or nose? Unk Did you need to seek medical attention at a hospital or doctor's office? No When did it last happen? Childhood If all above answers are "NO", may proceed with cephalosporin use.    Simvastatin Itching   Sumatriptan Succinate Other (See Comments)    Hypotension     History of Present Illness    Allison Richardson is a 78 y.o. female patient of Dr Audery Blazing, in the office today for hypertension evaluation.   Patient has had multiple medication changes over the past month, none that have had impact on her pressure.  She and her husband are frustrated by the lack of improvement and the multiple medication changes.  She has resorted to taking hot baths at various times of day, which bring her pressure down 20-30 points.    When I saw her last month we changed clonidine  to prn, added chlorthalidone  25 mg daily and consolidated meds to twice daily.  She messaged us  about 2 weeks later, pressures were mostly WNL after a week on the new regimen.   She also reported less edema and weigh loss.  We cut the nebivolol  to 5 mg daily (from bid) to avoid systolic readings < 100.  Today she returns for follow up.  She is feeling well and reports swelling is no longer an issue.   Still having a few readings drop < 100 systolic, but tolerating them well.  (5 in the last 18 days)  Blood Pressure Goal:  130/80  Current Medications:  clonidine  0.1 mg prn SBP > 160, irbesartan  150 mg every day, nebivolol  5 mg every day, spironolactone  12.5 mg every day, chlorthalidone  25 mg qd  Previously tried:  amlodipine  - LEE   Hydralazine  - nausea  Family Hx:  both parents had htn, father had stroke at 28 no residual issues, mother had stroke at 56 passed; no siblings, no kids  Social Hx:      Tobacco: no  Alcohol:no  Caffeine: 2 coffee home brewed  Diet: eats out  - moved to retirement communit 8 yers ago; doesn't like their foods; tries to limiit sodium content  ([employer]);  resorts to sandwiches or salads; does eat at home on Sundays; one meal/day at the facility  Exercise: walks dog twice daily, 20 min, about 5000 steps per day  Home BP readings:  much improved, since stopping second dose of nebivolol   AM average 105/67  HR 79  (all readings WNL)  PM average 112/68  HR 77  (1 systolic reading at 133)   Accessory Clinical Findings    Lab Results  Component Value Date   CREATININE 0.90 02/24/2024   BUN 26 02/24/2024   NA 135 02/24/2024   K 4.9 02/24/2024   CL 96 02/24/2024   CO2 21 02/24/2024   Lab Results  Component Value Date   ALT 37 01/25/2024   AST 30 01/25/2024   ALKPHOS 90 01/25/2024   BILITOT 0.5 01/25/2024   Lab Results  Component Value Date   HGBA1C 6.6 (H) 01/30/2023    Home Medications    Current Outpatient Medications  Medication Sig Dispense Refill   acetaminophen  (TYLENOL ) 500 MG tablet Take 500 mg by mouth every 6 (six) hours as needed (for pain.).     atorvastatin  (LIPITOR) 10 MG tablet TAKE ONE TABLET BY  MOUTH AT BEDTIME 90 tablet 3   chlorthalidone  (HYGROTON ) 25 MG tablet Take 1 tablet (25 mg total) by mouth daily. 30 tablet 3   Cholecalciferol (VITAMIN D-3) 25 MCG (1000 UT) CAPS Take 1,000 Units by mouth daily.     cloNIDine  (CATAPRES ) 0.1 MG tablet Take 1 tablet (0.1 mg total) by mouth 2 (two) times daily. 180 tablet 3   ELIQUIS  5 MG TABS tablet TAKE ONE TABLET BY MOUTH TWICE DAILY 180 tablet 1   furosemide  (LASIX ) 20 MG tablet Take 1 tablet (20 mg total) by mouth daily. 90 tablet 3   irbesartan  (AVAPRO ) 150 MG tablet Take 1 tablet (150 mg total) by mouth daily. 30 tablet 6   nebivolol  (BYSTOLIC ) 5 MG tablet TAKE 1 TABLET TWICE A DAY 180 tablet 3   omeprazole  (PRILOSEC) 40 MG capsule Take 1 capsule (40 mg total) by mouth daily. 90 capsule 3   Probiotic Product (DIGESTIVE ADVANTAGE PO) Take 1 capsule by mouth daily.     spironolactone  (ALDACTONE ) 25 MG tablet Take 0.5 tablets (12.5 mg total) by mouth daily. 45 tablet 3   vitamin C (ASCORBIC ACID) 500 MG tablet Take 500 mg by mouth daily.     Wheat Dextrin (BENEFIBER DRINK MIX PO) Take 4.2 g by mouth daily. Mix 1 teaspoonful of powder into 6-8 ounces of coffee and drink in the morning (once a day)     No current facility-administered medications for this visit.         Assessment & Plan    Essential hypertension Assessment: BP is controlled in office BP 104/66 mmHg;   Tolerates irbesartan , chlorthalidone , spironolactone  and nebivolol  well, without any side effects Denies SOB, palpitation, chest pain, headaches,or swelling Still getting a few hypotensive readings, although not symptomatic Reiterated the importance of regular exercise and low salt diet   Plan:  Stop taking spironolactone  Continue taking nebivolol  and chlorthalidone  in the mornings and irbesartan  at night Patient to keep record of BP readings with heart rate Patient to follow up with Dr. Audery Blazing in 3 months Reach out to me via MyChart should she have any concerns  about blood pressure  Labs ordered today:  none   Donivan Furry PharmD CPP [facility]  [address] [place], [place] [address] [phone]

## 2024-03-18 NOTE — Assessment & Plan Note (Signed)
 Assessment: BP is controlled in office BP 104/66 mmHg;   Tolerates irbesartan , chlorthalidone , spironolactone  and nebivolol  well, without any side effects Denies SOB, palpitation, chest pain, headaches,or swelling Still getting a few hypotensive readings, although not symptomatic Reiterated the importance of regular exercise and low salt diet   Plan:  Stop taking spironolactone  Continue taking nebivolol  and chlorthalidone  in the mornings and irbesartan  at night Patient to keep record of BP readings with heart rate Patient to follow up with Dr. Audery Blazing in 3 months Reach out to me via MyChart should she have any concerns about blood pressure  Labs ordered today:  none

## 2024-03-18 NOTE — Patient Instructions (Signed)
 Follow up appointment: WITH DR. CRENSHAW IN SEPTEMBER  Take your BP meds as follows:  STOP SPIRONOLACTONE   CONTINUE IRBESARTAN , NEBIVOLOL  AND CHLORTHALIDONE   KEEP CLONIDINE  FOR SYSTOLIC PRESSURE > 160  Check your blood pressure at home daily (if able) and keep record of the readings.  Your blood pressure goal is < 130/80  To check your pressure at home you will need to:  1. Sit up in a chair, with feet flat on the floor and back supported. Do not cross your ankles or legs. 2. Rest your left arm so that the cuff is about heart level. If the cuff goes on your upper arm,  then just relax the arm on the table, arm of the chair or your lap. If you have a wrist cuff, we  suggest relaxing your wrist against your chest (think of it as Pledging the Flag with the  wrong arm).  3. Place the cuff snugly around your arm, about 1 inch above the crook of your elbow. The  cords should be inside the groove of your elbow.  4. Sit quietly, with the cuff in place, for about 5 minutes. After that 5 minutes press the power  button to start a reading. 5. Do not talk or move while the reading is taking place.  6. Record your readings on a sheet of paper. Although most cuffs have a memory, it is often  easier to see a pattern developing when the numbers are all in front of you.  7. You can repeat the reading after 1-3 minutes if it is recommended  Make sure your bladder is empty and you have not had caffeine or tobacco within the last 30 min  Always bring your blood pressure log with you to your appointments. If you have not brought your monitor in to be double checked for accuracy, please bring it to your next appointment.  You can find a list of quality blood pressure cuffs at [email or web address]  Important lifestyle changes to control high blood pressure  Intervention  Effect on the BP  Lose extra pounds and watch your waistline Weight loss is one of the most effective lifestyle changes for controlling  blood pressure. If you're overweight or obese, losing even a small amount of weight can help reduce blood pressure. Blood pressure might go down by about 1 millimeter of mercury (mm Hg) with each kilogram (about 2.2 pounds) of weight lost.  Exercise regularly As a general goal, aim for at least 30 minutes of moderate physical activity every day. Regular physical activity can lower high blood pressure by about 5 to 8 mm Hg.  Eat a healthy diet Eating a diet rich in whole grains, fruits, vegetables, and low-fat dairy products and low in saturated fat and cholesterol. A healthy diet can lower high blood pressure by up to 11 mm Hg.  Reduce salt (sodium) in your diet Even a small reduction of sodium in the diet can improve heart health and reduce high blood pressure by about 5 to 6 mm Hg.  Limit alcohol One drink equals 12 ounces of beer, 5 ounces of wine, or 1.5 ounces of 80-proof liquor.  Limiting alcohol to less than one drink a day for women or two drinks a day for men can help lower blood pressure by about 4 mm Hg.   If you have any questions or concerns please use My Chart to send questions or call the office at [phone]

## 2024-04-08 ENCOUNTER — Encounter: Payer: Self-pay | Admitting: Pharmacist Clinician (PhC)/ Clinical Pharmacy Specialist

## 2024-04-09 ENCOUNTER — Encounter (HOSPITAL_COMMUNITY): Payer: Self-pay | Admitting: Interventional Radiology

## 2024-06-04 ENCOUNTER — Encounter: Payer: Self-pay | Admitting: Cardiology

## 2024-06-06 MED ORDER — CHLORTHALIDONE 25 MG PO TABS: 25.0000 mg | ORAL_TABLET | Freq: Every day | ORAL | 3 refills | Status: AC

## 2024-06-09 NOTE — Progress Notes (Signed)
 HPI: FU atrial fibrillation and hypertension. Also h/o TIA.  Renal Dopplers December 2020 showed no renal artery stenosis.  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 TEE May 2021 showed normal LV function, mild mitral regurgitation, moderate left atrial enlargement, no left atrial appendage thrombus and mild tricuspid regurgitation. Patient subsequently underwent cardioversion successfully to sinus bradycardia.  FU Monitor April 2022 showed atrial fibrillation rate controlled.  Now treated with rate control.  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  Follow-up echocardiogram April 2025 showed normal LV function, severe left atrial enlargement, mild to moderate tricuspid regurgitation.  Since last seen, she has mild dyspnea exertion but no orthopnea, PND, pedal, chest pain or syncope.  No bleeding.  Current Outpatient Medications  Medication Sig Dispense Refill   acetaminophen  (TYLENOL ) 500 MG tablet Take 500 mg by mouth every 6 (six) hours as needed (for pain.).     atorvastatin  (LIPITOR) 10 MG tablet TAKE ONE TABLET BY MOUTH AT BEDTIME 90 tablet 3   chlorthalidone  (HYGROTON ) 25 MG tablet Take 1 tablet (25 mg total) by mouth daily. 90 tablet 3   Cholecalciferol (VITAMIN D-3) 25 MCG (1000 UT) CAPS Take 1,000 Units by mouth daily.     ELIQUIS  5 MG TABS tablet TAKE ONE TABLET BY MOUTH TWICE DAILY 180 tablet 1   irbesartan  (AVAPRO ) 150 MG tablet Take 1 tablet (150 mg total) by mouth daily. 30 tablet 6   nebivolol  (BYSTOLIC ) 5 MG tablet TAKE 1 TABLET TWICE A DAY 180 tablet 3   omeprazole  (PRILOSEC) 40 MG capsule Take 1 capsule (40 mg total) by mouth daily. 90 capsule 3   Probiotic Product (DIGESTIVE ADVANTAGE PO) Take 1 capsule by mouth daily.     vitamin C (ASCORBIC ACID) 500 MG tablet Take 500 mg by mouth daily.      Wheat Dextrin (BENEFIBER DRINK MIX PO) Take 4.2 g by mouth daily. Mix 1 teaspoonful of powder into 6-8 ounces of coffee and drink in the morning (once a day)     No current facility-administered medications for this visit.     Past Medical History:  Diagnosis Date   GERD (gastroesophageal reflux disease)    Hypercholesteremia    Hyperlipidemia 12/05/2017   Hypertension    Hypothyroid    Lyme disease    Osteopenia after menopause 05/14/2018   DEXA 04/2018, [facility]: Impression- Osteopenia   T Scores  R Femur Neck- -0.70 L Femur Neck- -1.20 R Total Femur- -0.40 L Total Femur- -1.50 AP Total Spine: -0.30    SVT (supraventricular tachycardia) (HCC)    TIA (transient ischemic attack) 12/05/2017   TIA (transient ischemic attack)     Past Surgical History:  Procedure Laterality Date   ABDOMINAL HYSTERECTOMY     APPENDECTOMY     BUNIONECTOMY Left    CARDIOVERSION N/A 03/08/2020   Procedure: CARDIOVERSION;  Surgeon: Pietro Redell RAMAN, MD;  Location: [facility] ENDOSCOPY;  Service: Cardiovascular;  Laterality: N/A;   IR RADIOLOGIST EVAL & MGMT  01/05/2018   TEE WITHOUT CARDIOVERSION N/A 03/08/2020   Procedure: TRANSESOPHAGEAL ECHOCARDIOGRAM (TEE);  Surgeon: Pietro Redell RAMAN, MD;  Location: [facility] ENDOSCOPY;  Service: Cardiovascular;  Laterality: N/A;    Social History   Socioeconomic History   Marital status: Married    Spouse name: Not on file   Number of children: 0   Years  of education: 18-20   Highest education level: Not on file  Occupational History   Occupation: Retired  Tobacco Use   Smoking status: Never   Smokeless tobacco: Never  Vaping Use   Vaping status: Never Used  Substance and Sexual Activity   Alcohol use: No   Drug use: No   Sexual activity: Yes  Other Topics Concern   Not on file  Social History Narrative   Lives with husband   Caffeine use: Coffee daily   Right handed   Social Drivers of Corporate investment banker Strain: Not on file  Food Insecurity: Not on file   Transportation Needs: Not on file  Physical Activity: Not on file  Stress: Not on file  Social Connections: Not on file  Intimate Partner Violence: Not on file    Family History  Problem Relation Age of Onset   Stroke Mother    High blood pressure Mother    High Cholesterol Mother    Hypertension Mother    Stroke Father    High blood pressure Father    High Cholesterol Father    Cancer Father    Hypertension Father    Lung cancer Father    Heart attack Maternal Grandfather    Heart disease Maternal Grandfather    Stroke Maternal Grandfather    Early death Paternal Grandmother    Heart attack Paternal Grandmother    Heart disease Paternal Grandmother    Heart attack Paternal Grandfather    Heart disease Paternal Grandfather     ROS: no fevers or chills, productive cough, hemoptysis, dysphasia, odynophagia, melena, hematochezia, dysuria, hematuria, rash, seizure activity, orthopnea, PND, pedal edema, claudication. Remaining systems are negative.  Physical Exam: Well-developed well-nourished in no acute distress.  Skin is warm and dry.  HEENT is normal.  Neck is supple.  Chest is clear to auscultation with normal expansion.  Cardiovascular exam is regular rate and rhythm.  Abdominal exam nontender or distended. No masses palpated. Extremities show no edema. neuro grossly intact   A/P  1 chronic diastolic congestive heart failure-patient appears to be euvolemic on examination.  Continue diuretics at present dose.  Add Jardiance  10 mg daily.  Check potassium and renal function in 1 week.  2 permanent atrial fibrillation-Will continue beta-blocker at present dose for rate control.  Continue apixaban .  3 hypertension-patient's blood pressure is controlled.  Continue present medical regimen.  Note amlodipine  was discontinued previously secondary to pedal edema.  4 hyperlipidemia-continue statin.  Will have most recent lipids and liver forwarded to us  from primary  care.  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  No change on most recent MRA.  6 prior splenic infarct/TIA-patient will require lifelong anticoagulation.  Redell Shallow, MD

## 2024-06-14 ENCOUNTER — Encounter: Payer: Self-pay | Admitting: Sports Medicine

## 2024-06-22 ENCOUNTER — Other Ambulatory Visit (HOSPITAL_COMMUNITY): Payer: Self-pay | Admitting: Radiology

## 2024-06-22 ENCOUNTER — Encounter: Payer: Self-pay | Admitting: Cardiology

## 2024-06-22 ENCOUNTER — Ambulatory Visit: Attending: Cardiology | Admitting: Cardiology

## 2024-06-22 VITALS — BP 118/74 | HR 74 | Ht 67.0 in | Wt 188.0 lb

## 2024-06-22 DIAGNOSIS — I10 Essential (primary) hypertension: Secondary | ICD-10-CM | POA: Diagnosis not present

## 2024-06-22 DIAGNOSIS — I5032 Chronic diastolic (congestive) heart failure: Secondary | ICD-10-CM | POA: Diagnosis present

## 2024-06-22 DIAGNOSIS — I4821 Permanent atrial fibrillation: Secondary | ICD-10-CM | POA: Diagnosis present

## 2024-06-22 DIAGNOSIS — I671 Cerebral aneurysm, nonruptured: Secondary | ICD-10-CM

## 2024-06-22 DIAGNOSIS — E785 Hyperlipidemia, unspecified: Secondary | ICD-10-CM | POA: Insufficient documentation

## 2024-06-22 MED ORDER — EMPAGLIFLOZIN 10 MG PO TABS: 10.0000 mg | ORAL_TABLET | Freq: Every day | ORAL | Status: DC

## 2024-06-22 MED ORDER — EMPAGLIFLOZIN 10 MG PO TABS: 10.0000 mg | ORAL_TABLET | Freq: Every day | ORAL | 6 refills | Status: DC

## 2024-06-22 NOTE — Patient Instructions (Signed)
 Medication Instructions:   START JARDIANCE  10 MG ONCE DAILY  *If you need a refill on your cardiac medications before your next appointment, please call your pharmacy*  Lab Work:  Your physician recommends that you return for lab work in: ONE WEEK-DO NOT NEED TO FAST  [employer] [facility]  Located on the 3 rd floor in [address] Hours-Monday - Friday 8 am-111:30 AM and 1 pm -4 pm   If you have labs (blood work) drawn today and your tests are completely normal, you will receive your results only by: MyChart Message (if you have MyChart) OR A paper copy in the mail If you have any lab test that is abnormal or we need to change your treatment, we will call you to review the results.   Follow-Up: At [facility], you and your health needs are our priority.  As part of our continuing mission to provide you with exceptional heart care, our providers are all part of one team.  This team includes your primary Cardiologist (physician) and Advanced Practice Providers or APPs (Physician Assistants and Nurse Practitioners) who all work together to provide you with the care you need, when you need it.  Your next appointment:   6 month(s)  Provider:   Redell Shallow, MD

## 2024-06-23 ENCOUNTER — Encounter: Payer: Self-pay | Admitting: Cardiology

## 2024-06-23 ENCOUNTER — Telehealth: Payer: Self-pay | Admitting: Pharmacy Technician

## 2024-06-23 NOTE — Telephone Encounter (Signed)
   Pharmacy Patient Advocate Encounter   Received notification from CoverMyMeds that prior authorization for jardiance  is required/requested.   Insurance verification completed.   The patient is insured through [employer] .   Per test claim: PA required; PA submitted to above mentioned insurance via Latent Key/confirmation #/EOC [id number] Status is pending   Pharmacy Patient Advocate Encounter  Received notification from [employer] that Prior Authorization for jardiance  has been APPROVED from 06/23/24 to 06/23/25   PA #/Case ID/Reference #: [id number]

## 2024-06-24 ENCOUNTER — Other Ambulatory Visit: Payer: Self-pay | Admitting: Cardiology

## 2024-06-29 ENCOUNTER — Ambulatory Visit (INDEPENDENT_AMBULATORY_CARE_PROVIDER_SITE_OTHER): Admitting: Neuroradiology

## 2024-06-29 DIAGNOSIS — I671 Cerebral aneurysm, nonruptured: Secondary | ICD-10-CM | POA: Diagnosis not present

## 2024-06-29 NOTE — Progress Notes (Addendum)
 I had the pleasure of speaking with Allison Richardson on the phone today for discussion of her brain aneurysms.  She has had imaging studies that began in 2019 with a brain MRI and MRA which was obtained for some disorientation and lower extremity weakness.  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  She has had multiple studies since that time including MR angiography in September 2019, October 2020, January 2022, February 2024 and February 2025.  I reviewed all of those studies, which showed no interval change in the 2 mm ACOM aneurysm, or the 2 mm carotid infundibulum/aneurysm.  She has never had a headache suspicious of subarachnoid hemorrhage.  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  Recommendation:  I explained to the patient that it is highly unlikely that her aneurysm would bleed.  Any surgical treatment carries a far higher risk than the aneurysm bleeding.  As such, no surgery is recommended.  I also think given the 6-year stability, no further imaging surveillance is needed.  Billing: I spent 25 minutes reviewing the record and imaging studies in preparation for the phone consultation which was from my office at [facility].  The call lasted 7 minutes.    The patient was in a remote location, on her home phone (presumably at home).

## 2024-06-30 ENCOUNTER — Telehealth: Admitting: Neuroradiology

## 2024-08-06 ENCOUNTER — Encounter: Payer: Self-pay | Admitting: Cardiology

## 2024-08-06 DIAGNOSIS — I10 Essential (primary) hypertension: Secondary | ICD-10-CM

## 2024-08-06 DIAGNOSIS — I4819 Other persistent atrial fibrillation: Secondary | ICD-10-CM

## 2024-08-08 MED ORDER — APIXABAN 5 MG PO TABS
5.0000 mg | ORAL_TABLET | Freq: Two times a day (BID) | ORAL | 1 refills | Status: AC
Start: 2024-08-08 — End: ?

## 2024-08-08 MED ORDER — IRBESARTAN 150 MG PO TABS: 150.0000 mg | ORAL_TABLET | Freq: Every day | ORAL | 3 refills | Status: AC

## 2025-05-11 ENCOUNTER — Encounter: Admitting: Family Medicine
